# Patient Record
Sex: Female | Born: 1960
Health system: Southern US, Community
[De-identification: ages and names within clinical notes are randomized; demographics above are authoritative.]

## PROBLEM LIST (undated history)

## (undated) DIAGNOSIS — T8859XA Other complications of anesthesia, initial encounter: Secondary | ICD-10-CM

## (undated) DIAGNOSIS — I10 Essential (primary) hypertension: Secondary | ICD-10-CM

## (undated) DIAGNOSIS — M1990 Unspecified osteoarthritis, unspecified site: Secondary | ICD-10-CM

## (undated) DIAGNOSIS — I639 Cerebral infarction, unspecified: Secondary | ICD-10-CM

## (undated) DIAGNOSIS — L509 Urticaria, unspecified: Secondary | ICD-10-CM

## (undated) DIAGNOSIS — I6789 Other cerebrovascular disease: Secondary | ICD-10-CM

## (undated) DIAGNOSIS — Z8669 Personal history of other diseases of the nervous system and sense organs: Secondary | ICD-10-CM

## (undated) DIAGNOSIS — G4733 Obstructive sleep apnea (adult) (pediatric): Secondary | ICD-10-CM

## (undated) DIAGNOSIS — N809 Endometriosis, unspecified: Secondary | ICD-10-CM

## (undated) DIAGNOSIS — F32A Depression, unspecified: Secondary | ICD-10-CM

## (undated) DIAGNOSIS — Z9889 Other specified postprocedural states: Secondary | ICD-10-CM

## (undated) DIAGNOSIS — L309 Dermatitis, unspecified: Secondary | ICD-10-CM

## (undated) DIAGNOSIS — R112 Nausea with vomiting, unspecified: Secondary | ICD-10-CM

## (undated) DIAGNOSIS — F419 Anxiety disorder, unspecified: Secondary | ICD-10-CM

## (undated) HISTORY — PX: TOTAL ABDOMINAL HYSTERECTOMY: SHX209

## (undated) HISTORY — DX: Endometriosis, unspecified: N80.9

## (undated) HISTORY — DX: Other cerebrovascular disease: I67.89

## (undated) HISTORY — DX: Personal history of other diseases of the nervous system and sense organs: Z86.69

## (undated) HISTORY — DX: Obstructive sleep apnea (adult) (pediatric): G47.33

## (undated) HISTORY — DX: Dermatitis, unspecified: L30.9

## (undated) HISTORY — DX: Cerebral infarction, unspecified: I63.9

## (undated) HISTORY — PX: CHOLECYSTECTOMY: SHX55

## (undated) HISTORY — DX: Unspecified osteoarthritis, unspecified site: M19.90

## (undated) HISTORY — PX: APPENDECTOMY: SHX54

## (undated) HISTORY — DX: Urticaria, unspecified: L50.9

## (undated) HISTORY — PX: NECK SURGERY: SHX720

## (undated) HISTORY — PX: SALPINGOOPHORECTOMY: SHX82

## (undated) HISTORY — PX: OTHER SURGICAL HISTORY: SHX169

---

## 1997-08-13 ENCOUNTER — Inpatient Hospital Stay (HOSPITAL_COMMUNITY): Admission: AD | Admit: 1997-08-13 | Discharge: 1997-08-13 | Payer: Self-pay | Admitting: Obstetrics and Gynecology

## 1998-01-01 ENCOUNTER — Encounter: Admission: RE | Admit: 1998-01-01 | Discharge: 1998-04-01 | Payer: Self-pay | Admitting: Gynecology

## 1998-02-07 ENCOUNTER — Ambulatory Visit (HOSPITAL_COMMUNITY): Admission: RE | Admit: 1998-02-07 | Discharge: 1998-02-07 | Payer: Self-pay | Admitting: *Deleted

## 1998-02-15 ENCOUNTER — Inpatient Hospital Stay (HOSPITAL_COMMUNITY): Admission: AD | Admit: 1998-02-15 | Discharge: 1998-02-15 | Payer: Self-pay | Admitting: Gynecology

## 1998-02-18 ENCOUNTER — Inpatient Hospital Stay (HOSPITAL_COMMUNITY): Admission: AD | Admit: 1998-02-18 | Discharge: 1998-02-18 | Payer: Self-pay | Admitting: Obstetrics and Gynecology

## 1999-10-14 ENCOUNTER — Other Ambulatory Visit: Admission: RE | Admit: 1999-10-14 | Discharge: 1999-10-14 | Payer: Self-pay | Admitting: Obstetrics and Gynecology

## 2000-10-18 ENCOUNTER — Other Ambulatory Visit: Admission: RE | Admit: 2000-10-18 | Discharge: 2000-10-18 | Payer: Self-pay | Admitting: Obstetrics and Gynecology

## 2000-12-24 ENCOUNTER — Inpatient Hospital Stay (HOSPITAL_COMMUNITY): Admission: AD | Admit: 2000-12-24 | Discharge: 2000-12-24 | Payer: Self-pay | Admitting: Obstetrics and Gynecology

## 2001-05-31 ENCOUNTER — Observation Stay (HOSPITAL_COMMUNITY): Admission: AD | Admit: 2001-05-31 | Discharge: 2001-05-31 | Payer: Self-pay | Admitting: Obstetrics and Gynecology

## 2001-07-21 ENCOUNTER — Encounter (INDEPENDENT_AMBULATORY_CARE_PROVIDER_SITE_OTHER): Payer: Self-pay | Admitting: Specialist

## 2001-07-21 ENCOUNTER — Inpatient Hospital Stay (HOSPITAL_COMMUNITY): Admission: RE | Admit: 2001-07-21 | Discharge: 2001-07-25 | Payer: Self-pay | Admitting: Obstetrics and Gynecology

## 2001-08-02 ENCOUNTER — Inpatient Hospital Stay (HOSPITAL_COMMUNITY): Admission: AD | Admit: 2001-08-02 | Discharge: 2001-08-02 | Payer: Self-pay | Admitting: Obstetrics and Gynecology

## 2006-05-02 ENCOUNTER — Emergency Department (HOSPITAL_COMMUNITY): Admission: EM | Admit: 2006-05-02 | Discharge: 2006-05-02 | Payer: Self-pay | Admitting: Emergency Medicine

## 2006-05-09 ENCOUNTER — Emergency Department (HOSPITAL_COMMUNITY): Admission: EM | Admit: 2006-05-09 | Discharge: 2006-05-09 | Payer: Self-pay | Admitting: Emergency Medicine

## 2009-12-14 ENCOUNTER — Emergency Department (HOSPITAL_COMMUNITY): Admission: EM | Admit: 2009-12-14 | Discharge: 2009-12-14 | Payer: Self-pay | Admitting: Emergency Medicine

## 2010-08-12 DIAGNOSIS — I6789 Other cerebrovascular disease: Secondary | ICD-10-CM | POA: Insufficient documentation

## 2010-08-12 DIAGNOSIS — N809 Endometriosis, unspecified: Secondary | ICD-10-CM | POA: Insufficient documentation

## 2010-08-13 ENCOUNTER — Institutional Professional Consult (permissible substitution): Payer: Self-pay | Admitting: Pulmonary Disease

## 2010-09-08 ENCOUNTER — Encounter: Payer: Self-pay | Admitting: Pulmonary Disease

## 2010-09-09 ENCOUNTER — Encounter: Payer: Self-pay | Admitting: Pulmonary Disease

## 2010-09-09 ENCOUNTER — Ambulatory Visit (INDEPENDENT_AMBULATORY_CARE_PROVIDER_SITE_OTHER): Payer: Self-pay | Admitting: Pulmonary Disease

## 2010-09-09 VITALS — BP 150/70 | HR 109 | Temp 98.8°F | Ht 65.0 in | Wt 256.8 lb

## 2010-09-09 DIAGNOSIS — G4733 Obstructive sleep apnea (adult) (pediatric): Secondary | ICD-10-CM

## 2010-09-09 HISTORY — DX: Obstructive sleep apnea (adult) (pediatric): G47.33

## 2010-09-09 NOTE — Progress Notes (Signed)
Subjective:    Patient ID: Veronica Leblanc, female    DOB: Aug 01, 1960, 50 y.o.   MRN: [id number]  HPI CC: Veronica Leblanc  50 yo female with OSA.  She was told that she snores, and stops breathing while asleep.  She will also wake up occasionally with a snore.  A friend of hers who has sleep apnea said her symptoms were similar prior to CPAP set up.  [name] also c/o feeling tired all the time.  She goes to bed at 11pm.  She typically falls asleep quickly.  She wakes up several times per night, but is not sure why.  She gets out of bed a 7am to get her daughter ready for school.  She will then go back to sleep for several more hours.  She does not [place] anything to help sleep at night or stay awake during the day.  She does occasionally grind her teeth.  The patient denies sleep walking, sleep talking, or nightmares.  There is no history of restless legs.  The patient denies sleep hallucinations, sleep paralysis, or cataplexy.  She denies alcohol use or smoking.  Her weight has been steady.  There is no history of thyroid disease.  She had a stroke while on birth control for endometriosis at age 33, and then told she had several mini-strokes after that.  Epworth Score is 15 out of 24. PSG from 05/06/10>>AHI 9.6, SpO2 low 77% from [place].   CPAP titration 07/16/10>>CPAP 8 cm from [place].  She has not been set up with CPAP yet.  Past Medical History  Diagnosis Date  . Endometriosis, site unspecified   . Acute, but ill-defined, cerebrovascular disease   . History of migraine headaches   . CVA (cerebral vascular accident)   . Arthritis   . OSA (obstructive sleep apnea)   . Asthma   . CVA (cerebral vascular accident)     Age 69     Family History  Problem Relation Age of Onset  . Diabetes Father   . Diabetes Brother     multiple  . Hypertension Mother   . Hypertension Father   . Hypertension Brother   . Breast cancer Maternal Aunt   . Heart disease Maternal Grandfather     . Heart disease Paternal Grandfather   . Colon cancer Cousin   . Pancreatic cancer Maternal Grandmother      History   Social History  . Marital Status: Divorced    Spouse Name: N/A    Number of Children: N/A  . Years of Education: N/A   Occupational History  . 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 Smoking status: Never Smoker   . Smokeless tobacco: Not on file  . Alcohol Use: Not on file  . Drug Use: Not on file  . Sexually Active: Not on file   Other Topics Concern  . Not on file   Social History Narrative  . No narrative on file     Allergies  Allergen Reactions  . Amitriptyline Hcl Hives and Swelling  . Cefazolin Hives and Swelling  . Ceftriaxone Sodium Hives and Swelling  . Ciprofloxacin Hives and Swelling  . Darvocet (Propoxyphene N-Acetaminophen) Hives and Swelling  . Penicillins Hives and Swelling  . Prochlorperazine Edisylate Hives and Swelling  . Promethazine Hcl     Jaw locks up  . Sulfonamide Derivatives Hives and Swelling  . Sumatriptan Hives and Swelling  Outpatient Prescriptions Prior to Visit  Medication Sig Dispense Refill  . albuterol (PROVENTIL HFA) 108 (90 BASE) MCG/ACT inhaler Inhale 2 puffs into the lungs every 6 (six) hours as needed.        . cetirizine (ZYRTEC) 10 MG tablet Take 10 mg by mouth daily.        . cyclobenzaprine (FLEXERIL) 10 MG tablet Take 10 mg by mouth 3 (three) times daily as needed.        Marland Kitchen ibuprofen (IBU) 800 MG tablet Take 800 mg by mouth every 8 (eight) hours as needed.        . Fluticasone-Salmeterol (ADVAIR DISKUS) 250-50 MCG/DOSE AEPB Inhale 1 puff into the lungs 2 (two) times daily.        . Omega-3 Fatty Acids (FISH OIL) 1000 MG CAPS Take 1 capsule by mouth daily.             Review of Systems  HENT: Positive for ear pain and sneezing.   Respiratory: Positive for cough.   Musculoskeletal: Positive for joint swelling.  Neurological: Positive for headaches.       Objective:    Physical Exam  Constitutional: She is oriented to person, place, and time. She appears well-developed and well-nourished. No distress.  HENT:       Clear nasal discharge, no sinus tenderness, no oral exudate, MP 4 airway, enlarged tongue  Neck: Neck supple. No JVD present. No tracheal deviation present. No thyromegaly present.  Cardiovascular: Normal rate, regular rhythm and normal heart sounds.   No murmur heard. Pulmonary/Chest: Effort normal and breath sounds normal. She has no wheezes. She has no rales. She exhibits no tenderness.  Abdominal: Soft. Bowel sounds are normal. There is no tenderness. There is no rebound and no guarding.  Musculoskeletal: Normal range of motion. She exhibits no edema and no tenderness.  Lymphadenopathy:    She has no cervical adenopathy.  Neurological: She is alert and oriented to person, place, and time. No cranial nerve deficit.  Skin: Skin is warm and dry. No rash noted.  Psychiatric: She has a normal mood and affect. Her behavior is normal.          Assessment & Plan:   OSA (obstructive sleep apnea) I have reviewed his sleep test results with the patient.  Explained how sleep apnea can affect the patient's health.  Driving precautions and importance of weight loss were discussed.  Treatment options for sleep apnea were reviewed.  Will proceed with setting up CPAP 8 cm H2O.  Discussed techniques to adjust to mask fit.    Updated Medication List Outpatient Encounter Prescriptions as of 09/09/2010  Medication Sig Dispense Refill  . albuterol (PROVENTIL HFA) 108 (90 BASE) MCG/ACT inhaler Inhale 2 puffs into the lungs every 6 (six) hours as needed.        . cetirizine (ZYRTEC) 10 MG tablet Take 10 mg by mouth daily.        . cyclobenzaprine (FLEXERIL) 10 MG tablet Take 10 mg by mouth 3 (three) times daily as needed.        Marland Kitchen ibuprofen (IBU) 800 MG tablet Take 800 mg by mouth every 8 (eight) hours as needed.        . Fluticasone-Salmeterol (ADVAIR  DISKUS) 250-50 MCG/DOSE AEPB Inhale 1 puff into the lungs 2 (two) times daily.        . Omega-3 Fatty Acids (FISH OIL) 1000 MG CAPS Take 1 capsule by mouth daily.

## 2010-09-09 NOTE — Patient Instructions (Signed)
Will arrange for CPAP 8 cm H2O.

## 2010-09-09 NOTE — Assessment & Plan Note (Signed)
I have reviewed his sleep test results with the patient.  Explained how sleep apnea can affect the patient's health.  Driving precautions and importance of weight loss were discussed.  Treatment options for sleep apnea were reviewed.  Will proceed with setting up CPAP 8 cm H2O.  Discussed techniques to adjust to mask fit.

## 2010-10-23 ENCOUNTER — Encounter: Payer: Self-pay | Admitting: Pulmonary Disease

## 2010-10-30 NOTE — H&P (Signed)
[facility]  Patient:    Veronica Leblanc, Veronica Leblanc Visit Number: [id number] MRN: [id number]          Service Type: Attending:  Rande Brunt. Eda Paschal, M.D. Dictated by:   Rande Brunt. Eda Paschal, M.D.                           History and Physical  CHIEF COMPLAINT:  Severe dysmenorrhea, menorrhagia, and endometriosis.  HISTORY OF PRESENT ILLNESS:  The patient is a 50 year old gravida 4, para 1, abortus 3, who enters the hospital for total abdominal hysterectomy because of persistent progressive dysmenorrhea and menorrhagia.  She is completely incapacitated during her period.  She becomes severe dehydrated.  She requires regular antiemetics as well as codeine prescription to keep her dysmenorrhea until control and, on occasion, has even had to go to the hospital for intramuscular injections because of the dysmenorrhea, the nausea, and the dehydration.  She has a very long history of endometriosis.  Over a period of 30 years she has undergone five laparoscopies.  She also, in 1989, underwent a left salpingo-oophorectomy and a presacral neurectomy.  At one point she had a lot of trouble conceiving but finally was able to.  She has had a D&C hysteroscopy but continues to have the dysmenorrhea and menorrhagia.  She has failed multiple nonsteroidal anti-inflammatory drugs, has taken antiemetics and codeine, as noted above.  She has a long history of migraines and has not been able to tolerate oral contraceptives as a result of that.  She has tried Megace, she has tried Lupron Depot, and she has tried Depo-Provera.  At this point she is through with her fertility and, because of inability to control the above, she now enters the hospital for total abdominal hysterectomy.  She has one remaining ovary, and we had a very long discussion of pros and cons of removing it.  Obviously, with a history of endometriosis, she could have recurrent pelvic pain if we left it, but at 41 she  is uncomfortable with the thought of being dependent on hormone replacement therapy.  Her neurologist has felt that she probably would tolerate it; however, he said he certainly could not guarantee that.  After thinking about it for a long period, she has elected to keep her ovary unless when we operate on her we find that she has such significant endometriosis that reoperation is a certainty.  She now enters the hospital for above.  PAST MEDICAL HISTORY:  Please see above.  Please note the patient has a history of migraines.  She has also undergone appendectomy and cholecystectomy, as well as the procedures discussed above.  She has also undergone cesarean section.  PRESENT MEDICATIONS:  Lorcet and Inderal.  ALLERGIES:  PENICILLIN, SULFA, ROCEPHIN, CIPRO, ANCEF, IMITREX, LUPRON, PHENERGAN, ELAVIL, and COMPAZINE.  FAMILY HISTORY:  She has brothers who are diabetic.  Her mother, father, and brothers are also hypertensive.  She has a cousin who has had colon cancer. Maternal grandfather and paternal grandfather have had heart disease.  A maternal aunt has had breast cancer.  SOCIAL HISTORY:  She is a nonsmoker, nondrinker.  REVIEW OF SYSTEMS:  HEENT:  History of chronic headache.  CARDIAC:  Negative. RESPIRATORY:  Negative.  GASTROINTESTINAL:  Past history of nausea and vomiting not related to her period, without any significant GI source being found.  GENITOURINARY:  Negative.  NEUROMUSCULAR:  Negative except for headaches.  ENDOCRINE:  Negative.  PHYSICAL EXAMINATION:  GENERAL:  Well-developed, well-nourished female in no acute distress.  VITAL SIGNS:  Blood pressure 130/70, pulse 80 and regular, respirations 16 and unlabored, afebrile.  HEENT:  All within normal limits.  NECK:  Supple.  Trachea is midline.  Thyroid is not enlarged.  LUNGS:  Clear to P&A.  HEART:  No thrills, heaves, or murmurs.  BREASTS:  No masses.  ABDOMEN:  Soft.  Without guarding, rebound, or  masses.  PELVIC:  External and vaginal are within normal limits.  Cervix is clean.  Pap smear shows no atypia.  Uterus is top normal size and shape.  Adnexa within normal limits.  RECTAL:  Negative.  ADMISSION IMPRESSION: 1. Severe dysmenorrhea and menorrhagia, also associated with dehydration. 2. Significant history of endometriosis.  PLAN:  See above. Dictated by:   Rande Brunt. Eda Paschal, M.D. Attending:  Rande Brunt. Eda Paschal, M.D. DD:  07/20/01 TD:  07/20/01 Job: [id number]

## 2010-10-30 NOTE — Discharge Summary (Signed)
[facility]  Patient:    Veronica Leblanc, Veronica Leblanc [facility] Visit Number: [id number] MRN: [id number]          Service Type: INJ Location: [facility] Attending Physician:  Sharon Mt Dictated by:   Rande Brunt. Eda Paschal, M.D. Admit Date:  08/02/2001 Discharge Date: 08/02/2001                             Discharge Summary  HISTORY OF PRESENT ILLNESS:  The patient is a 50 year old female with persistent dysmenorrhea who entered the hospital for definitive surgery.  HOSPITAL COURSE:  On the day of admission she was taken to the operating room. A total abdominal hysterectomy and right ovarian cystectomy was performed for the above.  Postoperatively, the patient had a lot of trouble with pain.  She was treated with different PCA protocols.  She also ran a fever up to 101, which responded to incentive spirometry and ambulation.  In addition, she developed an ileus.  By the fourth postoperative day the ileus had cleared.  DISCHARGE MEDICATIONS: 1. Tylox for pain relief. 2. Zofran for nausea.  FOLLOWUP:  She is going to be seen in the office in three days for staple removal.  CONDITION ON DISCHARGE:  Improved.  ACTIVITY:  Ambulatory.  DIET:  Regular.  Final pathology report revealed a hemorrhagic corpus luteum of the right ovary.  Uterus without endometriosis, which the patient had previously, but with focal serosal fibrous adhesions.  DISCHARGE DIAGNOSIS:  Severe progressive dysmenorrhea.  OPERATION:  Total abdominal hysterectomy, right ovarian cystectomy. Dictated by:   Rande Brunt. Eda Paschal, M.D. Attending Physician:  Sharon Mt DD:  08/11/01 TD:  08/11/01 Job: [id number]

## 2010-10-30 NOTE — Op Note (Signed)
[facility]  Patient:    Veronica Leblanc, Veronica Leblanc [facility] Visit Number: [id number] MRN: [id number]          Service Type: GYN Location: 4W 0453 01 Attending Physician:  Sharon Mt Dictated by:   Rande Brunt. Eda Paschal, M.D. Proc. Date: 07/21/01 Admit Date:  07/21/2001                             Operative Report  PREOPERATIVE DIAGNOSES: 1. Dysmenorrhea. 2. Menorrhagia. 3. Endometriosis suspected.  POSTOPERATIVE DIAGNOSES: 1. Dysmenorrhea. 2. Menorrhagia. 3. Endometriosis suspected. 4. Benign right ovarian cyst.  OPERATION:  Total abdominal hysterectomy, right ovarian cystectomy.  SURGEON:  Daniel L. Eda Paschal, M.D.  FIRST ASSISTANT:  Timothy P. Fontaine, M.D.  FINDINGS:  The patients uterus was boggy and slightly enlarged.  There were adhesions at the vesicouterine fold to perineum where her previous cesarean section was done.  Her right ovary was enlarged by a 3 cm cyst that on frozen section was a corpus luteum hemorrhagic and not endometriosis.  There were no visible signs of endometriosis in the entire exploration of her abdomen and pelvis.  DESCRIPTION OF PROCEDURE:  After adequate general endotracheal anesthesia, the patient was placed in a supine position and prepped and draped in the usual sterile manner.  A Foley catheter was inserted into the patients bladder. The patients previous midline vertical incision was utilized.  A vertical incision was made, carried through the fascia, and then the peritoneum was identified and opened.  Subcutaneous bleeders were clamped and Bovied as encountered.  When the peritoneal cavity was opened, the above findings were noted.  First a right ovarian cystectomy was done with a sharp knife, and the cyst was removed intact without rupturing it.  The ovarian capsule was closed with a running 4-0 Prolene, burying the knot to prevent adhesions.  After this, the hysterectomy was performed.  The right round  ligament was Bovied and cut.  The uteroovarian ligament and fallopian tube on the right were clamped, cut, and doubly suture ligated with #1 chromic catgut.  On the left, she had had a previous left S&O, so all that needed to be done was to dissect the peritoneal fat free from that side.  Then the round ligament could be Bovied and cut.  The bladder flap was advanced with sharp dissection.  This was somewhat difficult because of the previous cesarean, but it was done without injury to the bladder.  The uterine arteries were then clamped, cut, and double suture ligated with #1 chromic catgut.  The parametrium was taken down in successive bites, clamped, cut, and suture ligated with #1 chromic catgut. The cervicovaginal junction was identified and then with sharp dissection, the uterus was sent to pathology for tissue diagnosis.  Frozen section on the ovary came back hemorrhagic corpus luteum, so the ovary was left in place as per my preoperative discussion with the patient.  Copious irrigation was done with Ringers lactate.  The angles of the vagina were sutured with #1 chromic catgut, incorporating cardinal ligaments and uterosacral ligaments for good vault support, and then the cuff was closed with figure-of-eights of 0 Vicryl. Two sponge, needle, and instrument counts were correct.  Peritoneum and fascia were closed in two layers with a looped 0 PDS, starting at each end and having it tied in the midline.  The skin was closed with staples.  Estimated blood loss for the entire procedure was 350 cc with none replaced.  The patient tolerated the procedure well and left the operating room in satisfactory condition, draining clear urine from her Foley catheter. Dictated by:   Rande Brunt. Eda Paschal, M.D. Attending Physician:  Sharon Mt DD:  07/21/01 TD:  07/21/01 Job: [id number]

## 2010-11-03 ENCOUNTER — Encounter: Payer: Self-pay | Admitting: Pulmonary Disease

## 2010-11-10 ENCOUNTER — Ambulatory Visit: Payer: Self-pay | Admitting: Pulmonary Disease

## 2010-11-20 ENCOUNTER — Encounter: Payer: Self-pay | Admitting: Pulmonary Disease

## 2010-11-24 ENCOUNTER — Ambulatory Visit: Payer: Self-pay | Admitting: Pulmonary Disease

## 2018-03-17 DIAGNOSIS — S161XXA Strain of muscle, fascia and tendon at neck level, initial encounter: Secondary | ICD-10-CM | POA: Diagnosis not present

## 2018-03-17 DIAGNOSIS — R000 Tachycardia, unspecified: Secondary | ICD-10-CM | POA: Diagnosis not present

## 2018-03-17 DIAGNOSIS — M542 Cervicalgia: Secondary | ICD-10-CM | POA: Diagnosis not present

## 2018-03-17 DIAGNOSIS — I10 Essential (primary) hypertension: Secondary | ICD-10-CM | POA: Diagnosis not present

## 2018-03-17 DIAGNOSIS — W19XXXA Unspecified fall, initial encounter: Secondary | ICD-10-CM | POA: Diagnosis not present

## 2018-03-17 DIAGNOSIS — S80919A Unspecified superficial injury of unspecified knee, initial encounter: Secondary | ICD-10-CM | POA: Diagnosis not present

## 2018-04-07 DIAGNOSIS — T7840XA Allergy, unspecified, initial encounter: Secondary | ICD-10-CM | POA: Diagnosis not present

## 2018-04-07 DIAGNOSIS — Z9114 Patient's other noncompliance with medication regimen: Secondary | ICD-10-CM | POA: Diagnosis not present

## 2018-04-07 DIAGNOSIS — R069 Unspecified abnormalities of breathing: Secondary | ICD-10-CM | POA: Diagnosis not present

## 2018-04-07 DIAGNOSIS — L299 Pruritus, unspecified: Secondary | ICD-10-CM | POA: Diagnosis not present

## 2018-04-07 DIAGNOSIS — L509 Urticaria, unspecified: Secondary | ICD-10-CM | POA: Diagnosis not present

## 2018-04-07 DIAGNOSIS — T782XXA Anaphylactic shock, unspecified, initial encounter: Secondary | ICD-10-CM | POA: Diagnosis not present

## 2018-04-07 DIAGNOSIS — I10 Essential (primary) hypertension: Secondary | ICD-10-CM | POA: Diagnosis not present

## 2018-04-07 DIAGNOSIS — R000 Tachycardia, unspecified: Secondary | ICD-10-CM | POA: Diagnosis not present

## 2018-04-27 DIAGNOSIS — I674 Hypertensive encephalopathy: Secondary | ICD-10-CM | POA: Diagnosis not present

## 2018-04-27 DIAGNOSIS — I10 Essential (primary) hypertension: Secondary | ICD-10-CM | POA: Diagnosis not present

## 2018-04-27 DIAGNOSIS — Z7189 Other specified counseling: Secondary | ICD-10-CM | POA: Diagnosis not present

## 2018-04-27 DIAGNOSIS — R000 Tachycardia, unspecified: Secondary | ICD-10-CM | POA: Diagnosis not present

## 2018-05-04 DIAGNOSIS — M25562 Pain in left knee: Secondary | ICD-10-CM | POA: Diagnosis not present

## 2018-05-04 DIAGNOSIS — I674 Hypertensive encephalopathy: Secondary | ICD-10-CM | POA: Diagnosis not present

## 2018-05-04 DIAGNOSIS — E785 Hyperlipidemia, unspecified: Secondary | ICD-10-CM | POA: Diagnosis not present

## 2018-05-04 DIAGNOSIS — M1712 Unilateral primary osteoarthritis, left knee: Secondary | ICD-10-CM | POA: Diagnosis not present

## 2018-05-04 DIAGNOSIS — G4733 Obstructive sleep apnea (adult) (pediatric): Secondary | ICD-10-CM | POA: Diagnosis not present

## 2018-05-15 DIAGNOSIS — E785 Hyperlipidemia, unspecified: Secondary | ICD-10-CM | POA: Diagnosis not present

## 2018-05-15 DIAGNOSIS — M1710 Unilateral primary osteoarthritis, unspecified knee: Secondary | ICD-10-CM | POA: Diagnosis not present

## 2018-05-15 DIAGNOSIS — R7301 Impaired fasting glucose: Secondary | ICD-10-CM | POA: Diagnosis not present

## 2018-05-15 DIAGNOSIS — I10 Essential (primary) hypertension: Secondary | ICD-10-CM | POA: Diagnosis not present

## 2018-05-15 DIAGNOSIS — R000 Tachycardia, unspecified: Secondary | ICD-10-CM | POA: Diagnosis not present

## 2018-05-17 DIAGNOSIS — Z1231 Encounter for screening mammogram for malignant neoplasm of breast: Secondary | ICD-10-CM | POA: Diagnosis not present

## 2018-05-31 DIAGNOSIS — R000 Tachycardia, unspecified: Secondary | ICD-10-CM | POA: Diagnosis not present

## 2018-05-31 DIAGNOSIS — I10 Essential (primary) hypertension: Secondary | ICD-10-CM | POA: Diagnosis not present

## 2018-05-31 DIAGNOSIS — R7303 Prediabetes: Secondary | ICD-10-CM | POA: Diagnosis not present

## 2018-06-03 DIAGNOSIS — R509 Fever, unspecified: Secondary | ICD-10-CM | POA: Diagnosis not present

## 2018-06-03 DIAGNOSIS — R0981 Nasal congestion: Secondary | ICD-10-CM | POA: Diagnosis not present

## 2018-06-05 DIAGNOSIS — R928 Other abnormal and inconclusive findings on diagnostic imaging of breast: Secondary | ICD-10-CM | POA: Diagnosis not present

## 2018-06-05 DIAGNOSIS — R921 Mammographic calcification found on diagnostic imaging of breast: Secondary | ICD-10-CM | POA: Diagnosis not present

## 2018-06-05 DIAGNOSIS — G4733 Obstructive sleep apnea (adult) (pediatric): Secondary | ICD-10-CM | POA: Diagnosis not present

## 2018-06-12 DIAGNOSIS — R928 Other abnormal and inconclusive findings on diagnostic imaging of breast: Secondary | ICD-10-CM | POA: Diagnosis not present

## 2018-06-12 DIAGNOSIS — R000 Tachycardia, unspecified: Secondary | ICD-10-CM | POA: Diagnosis not present

## 2018-06-12 DIAGNOSIS — I10 Essential (primary) hypertension: Secondary | ICD-10-CM | POA: Diagnosis not present

## 2018-06-12 DIAGNOSIS — G4733 Obstructive sleep apnea (adult) (pediatric): Secondary | ICD-10-CM | POA: Diagnosis not present

## 2018-06-12 DIAGNOSIS — R921 Mammographic calcification found on diagnostic imaging of breast: Secondary | ICD-10-CM | POA: Diagnosis not present

## 2018-06-15 DIAGNOSIS — N6011 Diffuse cystic mastopathy of right breast: Secondary | ICD-10-CM | POA: Diagnosis not present

## 2018-06-15 DIAGNOSIS — R928 Other abnormal and inconclusive findings on diagnostic imaging of breast: Secondary | ICD-10-CM | POA: Diagnosis not present

## 2018-06-15 DIAGNOSIS — N642 Atrophy of breast: Secondary | ICD-10-CM | POA: Diagnosis not present

## 2018-06-29 DIAGNOSIS — I10 Essential (primary) hypertension: Secondary | ICD-10-CM | POA: Diagnosis not present

## 2018-06-29 DIAGNOSIS — Z7189 Other specified counseling: Secondary | ICD-10-CM | POA: Diagnosis not present

## 2018-06-29 DIAGNOSIS — R062 Wheezing: Secondary | ICD-10-CM | POA: Diagnosis not present

## 2018-06-29 DIAGNOSIS — G4733 Obstructive sleep apnea (adult) (pediatric): Secondary | ICD-10-CM | POA: Diagnosis not present

## 2018-06-29 DIAGNOSIS — R000 Tachycardia, unspecified: Secondary | ICD-10-CM | POA: Diagnosis not present

## 2018-07-04 DIAGNOSIS — R000 Tachycardia, unspecified: Secondary | ICD-10-CM | POA: Diagnosis not present

## 2018-07-04 DIAGNOSIS — E785 Hyperlipidemia, unspecified: Secondary | ICD-10-CM | POA: Diagnosis not present

## 2018-07-04 DIAGNOSIS — I10 Essential (primary) hypertension: Secondary | ICD-10-CM | POA: Diagnosis not present

## 2018-07-04 DIAGNOSIS — J45909 Unspecified asthma, uncomplicated: Secondary | ICD-10-CM | POA: Diagnosis not present

## 2018-07-11 DIAGNOSIS — Z139 Encounter for screening, unspecified: Secondary | ICD-10-CM | POA: Diagnosis not present

## 2018-07-11 DIAGNOSIS — I10 Essential (primary) hypertension: Secondary | ICD-10-CM | POA: Diagnosis not present

## 2018-07-11 DIAGNOSIS — R000 Tachycardia, unspecified: Secondary | ICD-10-CM | POA: Diagnosis not present

## 2018-07-11 DIAGNOSIS — Z0000 Encounter for general adult medical examination without abnormal findings: Secondary | ICD-10-CM | POA: Diagnosis not present

## 2018-07-18 DIAGNOSIS — R000 Tachycardia, unspecified: Secondary | ICD-10-CM | POA: Diagnosis not present

## 2018-07-18 DIAGNOSIS — I10 Essential (primary) hypertension: Secondary | ICD-10-CM | POA: Diagnosis not present

## 2018-07-27 DIAGNOSIS — I674 Hypertensive encephalopathy: Secondary | ICD-10-CM | POA: Diagnosis not present

## 2018-07-27 DIAGNOSIS — E785 Hyperlipidemia, unspecified: Secondary | ICD-10-CM | POA: Diagnosis not present

## 2018-07-27 DIAGNOSIS — Z0000 Encounter for general adult medical examination without abnormal findings: Secondary | ICD-10-CM | POA: Diagnosis not present

## 2018-07-27 DIAGNOSIS — Z1211 Encounter for screening for malignant neoplasm of colon: Secondary | ICD-10-CM | POA: Diagnosis not present

## 2018-07-27 DIAGNOSIS — R7303 Prediabetes: Secondary | ICD-10-CM | POA: Diagnosis not present

## 2018-07-27 DIAGNOSIS — Z1329 Encounter for screening for other suspected endocrine disorder: Secondary | ICD-10-CM | POA: Diagnosis not present

## 2018-08-11 DIAGNOSIS — J45909 Unspecified asthma, uncomplicated: Secondary | ICD-10-CM | POA: Diagnosis not present

## 2018-08-29 DIAGNOSIS — R609 Edema, unspecified: Secondary | ICD-10-CM | POA: Diagnosis not present

## 2018-08-29 DIAGNOSIS — M25562 Pain in left knee: Secondary | ICD-10-CM | POA: Diagnosis not present

## 2018-08-29 DIAGNOSIS — M79662 Pain in left lower leg: Secondary | ICD-10-CM | POA: Diagnosis not present

## 2018-08-29 DIAGNOSIS — M7122 Synovial cyst of popliteal space [Baker], left knee: Secondary | ICD-10-CM | POA: Diagnosis not present

## 2018-08-30 DIAGNOSIS — G4733 Obstructive sleep apnea (adult) (pediatric): Secondary | ICD-10-CM | POA: Diagnosis not present

## 2018-09-01 DIAGNOSIS — G4733 Obstructive sleep apnea (adult) (pediatric): Secondary | ICD-10-CM | POA: Diagnosis not present

## 2018-09-05 DIAGNOSIS — Z7189 Other specified counseling: Secondary | ICD-10-CM | POA: Diagnosis not present

## 2018-09-05 DIAGNOSIS — J449 Chronic obstructive pulmonary disease, unspecified: Secondary | ICD-10-CM | POA: Diagnosis not present

## 2018-09-05 DIAGNOSIS — H269 Unspecified cataract: Secondary | ICD-10-CM | POA: Diagnosis not present

## 2018-09-05 DIAGNOSIS — M25562 Pain in left knee: Secondary | ICD-10-CM | POA: Diagnosis not present

## 2018-09-07 DIAGNOSIS — S83012A Lateral subluxation of left patella, initial encounter: Secondary | ICD-10-CM | POA: Diagnosis not present

## 2018-09-07 DIAGNOSIS — G8929 Other chronic pain: Secondary | ICD-10-CM

## 2018-09-07 DIAGNOSIS — M1712 Unilateral primary osteoarthritis, left knee: Secondary | ICD-10-CM | POA: Diagnosis not present

## 2018-09-07 DIAGNOSIS — M25562 Pain in left knee: Secondary | ICD-10-CM | POA: Diagnosis not present

## 2018-09-07 HISTORY — DX: Other chronic pain: G89.29

## 2018-09-13 DIAGNOSIS — J449 Chronic obstructive pulmonary disease, unspecified: Secondary | ICD-10-CM | POA: Diagnosis not present

## 2018-09-17 DIAGNOSIS — G4733 Obstructive sleep apnea (adult) (pediatric): Secondary | ICD-10-CM | POA: Diagnosis not present

## 2018-10-05 DIAGNOSIS — I872 Venous insufficiency (chronic) (peripheral): Secondary | ICD-10-CM | POA: Diagnosis not present

## 2018-10-05 DIAGNOSIS — Z7189 Other specified counseling: Secondary | ICD-10-CM | POA: Diagnosis not present

## 2018-10-13 DIAGNOSIS — J449 Chronic obstructive pulmonary disease, unspecified: Secondary | ICD-10-CM | POA: Diagnosis not present

## 2018-10-31 DIAGNOSIS — Z7189 Other specified counseling: Secondary | ICD-10-CM | POA: Diagnosis not present

## 2018-10-31 DIAGNOSIS — J449 Chronic obstructive pulmonary disease, unspecified: Secondary | ICD-10-CM | POA: Diagnosis not present

## 2018-11-13 DIAGNOSIS — J449 Chronic obstructive pulmonary disease, unspecified: Secondary | ICD-10-CM | POA: Diagnosis not present

## 2018-11-21 DIAGNOSIS — E785 Hyperlipidemia, unspecified: Secondary | ICD-10-CM | POA: Diagnosis not present

## 2018-11-21 DIAGNOSIS — R7303 Prediabetes: Secondary | ICD-10-CM | POA: Diagnosis not present

## 2018-11-27 ENCOUNTER — Institutional Professional Consult (permissible substitution): Payer: Self-pay | Admitting: Internal Medicine

## 2018-11-28 DIAGNOSIS — E785 Hyperlipidemia, unspecified: Secondary | ICD-10-CM | POA: Diagnosis not present

## 2018-11-28 DIAGNOSIS — I10 Essential (primary) hypertension: Secondary | ICD-10-CM | POA: Diagnosis not present

## 2018-11-28 DIAGNOSIS — R7303 Prediabetes: Secondary | ICD-10-CM | POA: Diagnosis not present

## 2018-11-28 DIAGNOSIS — Z23 Encounter for immunization: Secondary | ICD-10-CM | POA: Diagnosis not present

## 2018-11-28 DIAGNOSIS — R000 Tachycardia, unspecified: Secondary | ICD-10-CM | POA: Diagnosis not present

## 2018-12-07 DIAGNOSIS — M549 Dorsalgia, unspecified: Secondary | ICD-10-CM | POA: Diagnosis not present

## 2018-12-13 ENCOUNTER — Institutional Professional Consult (permissible substitution): Payer: Self-pay | Admitting: Internal Medicine

## 2018-12-13 DIAGNOSIS — J449 Chronic obstructive pulmonary disease, unspecified: Secondary | ICD-10-CM | POA: Diagnosis not present

## 2018-12-21 DIAGNOSIS — G8929 Other chronic pain: Secondary | ICD-10-CM | POA: Diagnosis not present

## 2018-12-21 DIAGNOSIS — M25562 Pain in left knee: Secondary | ICD-10-CM | POA: Diagnosis not present

## 2018-12-22 DIAGNOSIS — L508 Other urticaria: Secondary | ICD-10-CM | POA: Diagnosis not present

## 2018-12-26 DIAGNOSIS — Z87828 Personal history of other (healed) physical injury and trauma: Secondary | ICD-10-CM | POA: Diagnosis not present

## 2018-12-26 DIAGNOSIS — M47812 Spondylosis without myelopathy or radiculopathy, cervical region: Secondary | ICD-10-CM | POA: Diagnosis not present

## 2018-12-26 DIAGNOSIS — M542 Cervicalgia: Secondary | ICD-10-CM | POA: Diagnosis not present

## 2018-12-26 DIAGNOSIS — S0990XA Unspecified injury of head, initial encounter: Secondary | ICD-10-CM | POA: Diagnosis not present

## 2018-12-28 DIAGNOSIS — R52 Pain, unspecified: Secondary | ICD-10-CM | POA: Diagnosis not present

## 2018-12-28 DIAGNOSIS — R110 Nausea: Secondary | ICD-10-CM | POA: Diagnosis not present

## 2018-12-28 DIAGNOSIS — R197 Diarrhea, unspecified: Secondary | ICD-10-CM | POA: Diagnosis not present

## 2018-12-28 DIAGNOSIS — R112 Nausea with vomiting, unspecified: Secondary | ICD-10-CM | POA: Diagnosis not present

## 2018-12-28 DIAGNOSIS — R55 Syncope and collapse: Secondary | ICD-10-CM | POA: Diagnosis not present

## 2018-12-28 DIAGNOSIS — M542 Cervicalgia: Secondary | ICD-10-CM | POA: Diagnosis not present

## 2018-12-28 DIAGNOSIS — R51 Headache: Secondary | ICD-10-CM | POA: Diagnosis not present

## 2019-01-02 DIAGNOSIS — M542 Cervicalgia: Secondary | ICD-10-CM | POA: Diagnosis not present

## 2019-01-02 DIAGNOSIS — G44209 Tension-type headache, unspecified, not intractable: Secondary | ICD-10-CM | POA: Diagnosis not present

## 2019-01-13 DIAGNOSIS — J449 Chronic obstructive pulmonary disease, unspecified: Secondary | ICD-10-CM | POA: Diagnosis not present

## 2019-02-02 DIAGNOSIS — M7062 Trochanteric bursitis, left hip: Secondary | ICD-10-CM | POA: Diagnosis not present

## 2019-02-02 DIAGNOSIS — B372 Candidiasis of skin and nail: Secondary | ICD-10-CM | POA: Diagnosis not present

## 2019-02-08 DIAGNOSIS — H5213 Myopia, bilateral: Secondary | ICD-10-CM | POA: Diagnosis not present

## 2019-02-08 DIAGNOSIS — R000 Tachycardia, unspecified: Secondary | ICD-10-CM | POA: Diagnosis not present

## 2019-02-08 DIAGNOSIS — I10 Essential (primary) hypertension: Secondary | ICD-10-CM | POA: Diagnosis not present

## 2019-02-08 DIAGNOSIS — H25813 Combined forms of age-related cataract, bilateral: Secondary | ICD-10-CM | POA: Diagnosis not present

## 2019-02-13 DIAGNOSIS — J449 Chronic obstructive pulmonary disease, unspecified: Secondary | ICD-10-CM | POA: Diagnosis not present

## 2019-02-15 DIAGNOSIS — R000 Tachycardia, unspecified: Secondary | ICD-10-CM | POA: Diagnosis not present

## 2019-02-15 DIAGNOSIS — I10 Essential (primary) hypertension: Secondary | ICD-10-CM | POA: Diagnosis not present

## 2019-02-15 DIAGNOSIS — E039 Hypothyroidism, unspecified: Secondary | ICD-10-CM | POA: Diagnosis not present

## 2019-02-15 DIAGNOSIS — R7989 Other specified abnormal findings of blood chemistry: Secondary | ICD-10-CM | POA: Diagnosis not present

## 2019-02-17 DIAGNOSIS — R000 Tachycardia, unspecified: Secondary | ICD-10-CM | POA: Diagnosis not present

## 2019-03-06 DIAGNOSIS — M25562 Pain in left knee: Secondary | ICD-10-CM | POA: Diagnosis not present

## 2019-03-06 DIAGNOSIS — G8929 Other chronic pain: Secondary | ICD-10-CM | POA: Diagnosis not present

## 2019-03-08 DIAGNOSIS — R000 Tachycardia, unspecified: Secondary | ICD-10-CM | POA: Diagnosis not present

## 2019-03-08 DIAGNOSIS — I10 Essential (primary) hypertension: Secondary | ICD-10-CM | POA: Diagnosis not present

## 2019-03-12 ENCOUNTER — Other Ambulatory Visit: Payer: Self-pay | Admitting: Orthopedic Surgery

## 2019-03-12 DIAGNOSIS — M25562 Pain in left knee: Secondary | ICD-10-CM

## 2019-03-15 DIAGNOSIS — J449 Chronic obstructive pulmonary disease, unspecified: Secondary | ICD-10-CM | POA: Diagnosis not present

## 2019-03-21 ENCOUNTER — Other Ambulatory Visit: Payer: Self-pay

## 2019-03-21 ENCOUNTER — Encounter: Payer: Self-pay | Admitting: Internal Medicine

## 2019-03-21 ENCOUNTER — Ambulatory Visit (INDEPENDENT_AMBULATORY_CARE_PROVIDER_SITE_OTHER): Payer: Medicare Other | Admitting: Internal Medicine

## 2019-03-21 VITALS — BP 130/60 | HR 81 | Temp 97.7°F | Ht 64.25 in | Wt 275.8 lb

## 2019-03-21 DIAGNOSIS — J4520 Mild intermittent asthma, uncomplicated: Secondary | ICD-10-CM | POA: Insufficient documentation

## 2019-03-21 DIAGNOSIS — G4733 Obstructive sleep apnea (adult) (pediatric): Secondary | ICD-10-CM

## 2019-03-21 HISTORY — DX: Mild intermittent asthma, uncomplicated: J45.20

## 2019-03-21 MED ORDER — TEMAZEPAM 15 MG PO CAPS: ORAL_CAPSULE | ORAL | 0 refills | Status: DC

## 2019-03-21 NOTE — Progress Notes (Signed)
03/21/2019- 42 yoF never smoker for sleep evaluation referred by Janeice Robinson (PCP - [facility]) for OSA, reports having home sleep test spring 2020 - result being requested. Medical problem list includes CVA, OSA, Endometriosis, Asthma, Allergic Rhinitis, HBP,  [facility] 05/05/10- AHI 9.6/ hr, RDI 13.2/ hr, desaturation to 77%. Subsequent CPAP titration to 8 Has CPAP at home, not using. Body weight today 275 lbs Epworth score 17 Declines flu vax Her concerns are with difficulty initiating and maintaining sleep. Using melatonin with no prescription sleep med in past. Restless sleep with frequent brief waking, somatic discomforts, bathroom. Never feels rested. Daughter watching her tells her snoring and frequent apneas.  Naps help some. Occasional caffeine. 20 lb weight increase 2 years. Couldn't get comfortable with CPAP mask in 2012, pulled off in sleep.  She reports home sleep test this Spring didn't show much apnea but she did desaturate.  Bedtime 1-2AM, up 10-11AM.  Has had several C-spine surgeries, no Throat surgery. Has home O2 2L / [employer] for sleep but didn't change how she feels so not using.   Prior to Admission medications   Medication Sig Start Date End Date Taking? Authorizing Provider  albuterol (PROVENTIL HFA) 108 (90 BASE) MCG/ACT inhaler Inhale 2 puffs into the lungs every 6 (six) hours as needed.     Yes [provider]  budesonide-formoterol (SYMBICORT) 160-4.5 MCG/ACT inhaler Inhale 2 puffs into the lungs 2 (two) times daily.   Yes [provider]  cetirizine (ZYRTEC) 10 MG tablet Take 10 mg by mouth daily.     Yes [provider]  citalopram (CELEXA) 40 MG tablet Take 40 mg by mouth daily. Takes 1 tablet at bedtime.   Yes [provider]  furosemide (LASIX) 20 MG tablet Take 20 mg by mouth daily. Takes 1/2 tablet dailey as needed for edema.   Yes [provider]  lisinopril (ZESTRIL) 40 MG tablet  Take 40 mg by mouth daily.   Yes [provider]  Melatonin 1 MG TABS Take 9 mg by mouth at bedtime.   Yes [provider]  metoprolol tartrate (LOPRESSOR) 25 MG tablet Take 25 mg by mouth daily.   Yes [provider]  montelukast (SINGULAIR) 10 MG tablet Take 10 mg by mouth at bedtime.   Yes [provider]  rosuvastatin (CRESTOR) 10 MG tablet Take 10 mg by mouth daily.   Yes [provider]  cyclobenzaprine (FLEXERIL) 10 MG tablet Take 10 mg by mouth 3 (three) times daily as needed.      [provider]  ibuprofen (IBU) 800 MG tablet Take 800 mg by mouth every 8 (eight) hours as needed.      [provider]  Omega-3 Fatty Acids (FISH OIL) 1000 MG CAPS Take 1 capsule by mouth daily.      [provider]  temazepam (RESTORIL) 15 MG capsule 1 or 2 for sleep study as needed 03/21/19   Deneise Lever, MD   Past Medical History:  Diagnosis Date  . Acute, but ill-defined, cerebrovascular disease   . Arthritis   . Asthma   . CVA (cerebral vascular accident) ([place])   . CVA (cerebral vascular accident) ([place])    Age 58  . Endometriosis, site unspecified   . History of migraine headaches   . OSA (obstructive sleep apnea)    Past Surgical History:  Procedure Laterality Date  . APPENDECTOMY    . CESAREAN SECTION    . CHOLECYSTECTOMY    .  ganglionectomy     c2/c3 right and left side  . NECK SURGERY     5 times  . presacral neurectomy    . SALPINGOOPHORECTOMY     left  . TOTAL ABDOMINAL HYSTERECTOMY     Family History  Problem Relation Age of Onset  . Diabetes Father   . Hypertension Father   . Diabetes Brother        multiple  . Hypertension Mother   . Hypertension Brother   . Breast cancer Maternal Aunt   . Heart disease Maternal Grandfather   . Heart disease Paternal Grandfather   . Colon cancer Cousin   . Pancreatic cancer Maternal Grandmother    Social History   Socioeconomic History  . Marital status:  Divorced    Spouse name: Not on file  . Number of children: Not on file  . Years of education: Not on file  . Highest education level: Not on file  Occupational History  . 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 Financial resource strain: Not on file  . Food insecurity    Worry: Not on file    Inability: Not on file  . Transportation needs    Medical: Not on file    Non-medical: Not on file  Tobacco Use  . Smoking status: Never Smoker  . Smokeless tobacco: Never Used  Substance and Sexual Activity  . Alcohol use: Not on file  . Drug use: Not on file  . Sexual activity: Not on file  Lifestyle  . Physical activity    Days per week: Not on file    Minutes per session: Not on file  . Stress: Not on file  Relationships  . Social Musicianconnections    Talks on phone: Not on file    Gets together: Not on file    Attends religious service: Not on file    Active member of club or organization: Not on file    Attends meetings of clubs or organizations: Not on file    Relationship status: Not on file  . Intimate partner violence    Fear of current or ex partner: Not on file    Emotionally abused: Not on file    Physically abused: Not on file    Forced sexual activity: Not on file  Other Topics Concern  . Not on file  Social History Narrative  . Not on file    ROS-see HPI   + = positive Constitutional:    weight loss, night sweats, fevers, chills, +fatigue, lassitude. HEENT:    +headaches, difficulty swallowing, tooth/dental problems, sore throat,       +sneezing, itching, ear ache, +nasal congestion, post nasal drip, snoring CV:    chest pain, orthopnea, PND, +swelling in lower extremities, anasarca,                                  dizziness, palpitations Resp:   shortness of breath with exertion or at rest.                productive cough,   +non-productive cough, coughing up of blood.              change in color of mucus.  wheezing.   Skin:    rash or  lesions. GI:  No-   heartburn, indigestion, abdominal pain, nausea, vomiting, diarrhea,  change in bowel habits, loss of appetite GU: dysuria, change in color of urine, no urgency or frequency.   flank pain. MS:   joint pain, stiffness, decreased range of motion, back pain. Neuro-     nothing unusual Psych:  change in mood or affect.  depression or anxiety.   memory loss.  OBJ- Physical Exam General- Alert, Oriented, Affect-appropriate, Distress- none acute, + obese Skin- rash-none, lesions- none, excoriation- none Lymphadenopathy- none Head- atraumatic            Eyes- Gross vision intact, PERRLA, conjunctivae and secretions clear            Ears- Hearing, canals-normal            Nose- Clear, no-Septal dev, mucus, polyps, erosion, perforation             Throat- Mallampati III , mucosa clear , drainage- none, tonsils- atrophic,  + many missing teeth Neck- flexible , trachea midline, no stridor , thyroid nl, carotid no bruit Chest - symmetrical excursion , unlabored           Heart/CV- RRR , no murmur , no gallop  , no rub, nl s1 s2                           - JVD- none , edema- none, stasis changes- none, varices- none           Lung- clear to P&A, wheeze- none, cough- none , dullness-none, rub- none           Chest wall-  Abd-  Br/ Gen/ Rectal- Not done, not indicated Extrem- cyanosis- none, clubbing, none, atrophy- none, strength- nl Neuro- grossly intact to observation

## 2019-03-21 NOTE — Assessment & Plan Note (Signed)
By her description recent HST didn't show apnea, but did desaturate. Got home O2 but not using. Daughter tells her of witnessed apneas.  We are going to get an in-center sleep study to resolve this. She may then need additional treatment for insomnia. I've given limited supply of trazodone to help her sleep for the sleep study after discussion.

## 2019-03-21 NOTE — Patient Instructions (Addendum)
Order- We need to get result of recent home sleep test  Order- schedule Split Protocol sleep study   Dx OSA  Script sent for temazepam to take the night of your sleep study, as you get ready for bed if needed to help you sleep.  If you have more problem with asthma we can try to help with that as well.  Please call if you need [place].

## 2019-03-21 NOTE — Assessment & Plan Note (Addendum)
She reports this is occasional, but not currently active. If our in-put is needed we will try to help.

## 2019-03-30 ENCOUNTER — Other Ambulatory Visit: Payer: Self-pay

## 2019-03-30 ENCOUNTER — Ambulatory Visit
Admission: RE | Admit: 2019-03-30 | Discharge: 2019-03-30 | Disposition: A | Discharge status: Home / Self Care | Payer: Medicare Other | Source: Ambulatory Visit | Attending: Orthopedic Surgery | Admitting: Orthopedic Surgery

## 2019-03-30 ENCOUNTER — Other Ambulatory Visit: Payer: Medicare Other

## 2019-03-30 DIAGNOSIS — M25562 Pain in left knee: Secondary | ICD-10-CM

## 2019-03-30 DIAGNOSIS — M1712 Unilateral primary osteoarthritis, left knee: Secondary | ICD-10-CM | POA: Diagnosis not present

## 2019-03-30 DIAGNOSIS — M23322 Other meniscus derangements, posterior horn of medial meniscus, left knee: Secondary | ICD-10-CM | POA: Diagnosis not present

## 2019-03-30 DIAGNOSIS — M65862 Other synovitis and tenosynovitis, left lower leg: Secondary | ICD-10-CM | POA: Diagnosis not present

## 2019-03-30 DIAGNOSIS — M25462 Effusion, left knee: Secondary | ICD-10-CM | POA: Diagnosis not present

## 2019-03-31 ENCOUNTER — Other Ambulatory Visit (HOSPITAL_COMMUNITY)
Admission: RE | Admit: 2019-03-31 | Discharge: 2019-03-31 | Disposition: A | Discharge status: Home / Self Care | Payer: Medicare Other | Source: Ambulatory Visit | Attending: Internal Medicine | Admitting: Internal Medicine

## 2019-03-31 DIAGNOSIS — Z01812 Encounter for preprocedural laboratory examination: Secondary | ICD-10-CM | POA: Diagnosis not present

## 2019-03-31 DIAGNOSIS — Z20828 Contact with and (suspected) exposure to other viral communicable diseases: Secondary | ICD-10-CM | POA: Diagnosis not present

## 2019-04-01 LAB — NOVEL CORONAVIRUS, NAA (HOSP ORDER, SEND-OUT TO REF LAB; TAT 18-24 HRS): SARS-CoV-2, NAA: NOT DETECTED

## 2019-04-03 ENCOUNTER — Other Ambulatory Visit: Payer: Self-pay

## 2019-04-03 ENCOUNTER — Ambulatory Visit (HOSPITAL_BASED_OUTPATIENT_CLINIC_OR_DEPARTMENT_OTHER): Payer: Medicare Other | Attending: Internal Medicine | Admitting: Internal Medicine

## 2019-04-03 DIAGNOSIS — G4733 Obstructive sleep apnea (adult) (pediatric): Secondary | ICD-10-CM | POA: Insufficient documentation

## 2019-04-08 DIAGNOSIS — G4733 Obstructive sleep apnea (adult) (pediatric): Secondary | ICD-10-CM | POA: Diagnosis not present

## 2019-04-08 NOTE — Procedures (Signed)
   Patient Name: Veronica Leblanc, Kedzierski Date: 04/03/2019 Gender: Female D.O.B: [birth year] Age (years): 89 Referring Provider: Baird Lyons MD, ABSM Height (inches): 64 Interpreting Physician: Baird Lyons MD, ABSM Weight (lbs): 275 RPSGT: Laren Everts BMI: 17 MRN: [id number] Neck Size: 18.00  CLINICAL INFORMATION Sleep Study Type: NPSG Indication for sleep study: Excessive Daytime Sleepiness, Fatigue, Hypertension, Morning Headaches, Obesity, OSA, Re-Evaluation, Snoring, Witnessed Apneas Epworth Sleepiness Score: 15  SLEEP STUDY TECHNIQUE As per the AASM Manual for the Scoring of Sleep and Associated Events v2.3 (April 2016) with a hypopnea requiring 4% desaturations.  The channels recorded and monitored were frontal, central and occipital EEG, electrooculogram (EOG), submentalis EMG (chin), nasal and oral airflow, thoracic and abdominal wall motion, anterior tibialis EMG, snore microphone, electrocardiogram, and pulse oximetry.  MEDICATIONS Medications self-administered by patient taken the night of the study : CELEXA, CETIRIZINE, Rosuvastatin, SINGULAIR, SYMBICORT, TEMAZEPAM  SLEEP ARCHITECTURE The study was initiated at 10:05:50 PM and ended at 4:57:18 AM.  Sleep onset time was 11.9 minutes and the sleep efficiency was 86.5%%. The total sleep time was 356 minutes.  Stage REM latency was 173.0 minutes.  The patient spent 6.6%% of the night in stage N1 sleep, 74.7%% in stage N2 sleep, 0.0%% in stage N3 and 18.7% in REM.  Alpha intrusion was absent.  Supine sleep was 52.57%.  RESPIRATORY PARAMETERS The overall apnea/hypopnea index (AHI) was 11.8 per hour. There were 8 total apneas, including 7 obstructive, 1 central and 0 mixed apneas. There were 62 hypopneas and 6 RERAs.  The AHI during Stage REM sleep was 31.6 per hour.  AHI while supine was 14.4 per hour.  The mean oxygen saturation was 91.9%. The minimum SpO2 during sleep was 82.0%.  moderate snoring was  noted during this study.  CARDIAC DATA The 2 lead EKG demonstrated sinus rhythm. The mean heart rate was 70.2 beats per minute. Other EKG findings include: None.  LEG MOVEMENT DATA The total PLMS were 0 with a resulting PLMS index of 0.0. Associated arousal with leg movement index was 0.0 .  IMPRESSIONS - Mild obstructive sleep apnea occurred during this study (AHI = 11.8/h). - Insufficient early events to meet protocol requirements for split CPAP titration. - No significant central sleep apnea occurred during this study (CAI = 0.2/h). - Mild oxygen desaturation was noted during this study (Min O2 = 82.0%). Mean sat 91.9%.  - The patient snored with moderate snoring volume. - No cardiac abnormalities were noted during this study. - Clinically significant periodic limb movements did not occur during sleep. No significant associated arousals.  DIAGNOSIS - Obstructive Sleep Apnea (327.23 [G47.33 ICD-10])  RECOMMENDATIONS - Suggest CPAP titration sleep study ior autopap. Other options would be based on clinical judgment. - Be careful with alcohol, sedatives and other CNS depressants that may worsen sleep apnea and disrupt normal sleep architecture. - Sleep hygiene should be reviewed to assess factors that may improve sleep quality. - Weight management and regular exercise should be initiated or continued if appropriate.  [Electronically signed] 04/08/2019 01:03 PM  Baird Lyons MD, [place], American Board of Sleep Medicine   NPI: [id number]                          [place], [place] [facility]  ELECTRONICALLY SIGNED ON:  04/08/2019, 1:00 PM [place] PH: [phone]   FX: [phone] [place]

## 2019-04-10 DIAGNOSIS — G8929 Other chronic pain: Secondary | ICD-10-CM | POA: Diagnosis not present

## 2019-04-10 DIAGNOSIS — M25562 Pain in left knee: Secondary | ICD-10-CM | POA: Diagnosis not present

## 2019-04-15 DIAGNOSIS — J449 Chronic obstructive pulmonary disease, unspecified: Secondary | ICD-10-CM | POA: Diagnosis not present

## 2019-05-01 DIAGNOSIS — R7303 Prediabetes: Secondary | ICD-10-CM | POA: Diagnosis not present

## 2019-05-01 DIAGNOSIS — R7989 Other specified abnormal findings of blood chemistry: Secondary | ICD-10-CM | POA: Diagnosis not present

## 2019-05-01 DIAGNOSIS — E785 Hyperlipidemia, unspecified: Secondary | ICD-10-CM | POA: Diagnosis not present

## 2019-05-02 ENCOUNTER — Ambulatory Visit: Payer: Medicare Other | Admitting: Internal Medicine

## 2019-05-02 DIAGNOSIS — M94262 Chondromalacia, left knee: Secondary | ICD-10-CM | POA: Diagnosis not present

## 2019-05-02 DIAGNOSIS — M659 Synovitis and tenosynovitis, unspecified: Secondary | ICD-10-CM | POA: Diagnosis not present

## 2019-05-02 DIAGNOSIS — M65862 Other synovitis and tenosynovitis, left lower leg: Secondary | ICD-10-CM | POA: Diagnosis not present

## 2019-05-02 DIAGNOSIS — S83222A Peripheral tear of medial meniscus, current injury, left knee, initial encounter: Secondary | ICD-10-CM | POA: Diagnosis not present

## 2019-05-02 DIAGNOSIS — M6752 Plica syndrome, left knee: Secondary | ICD-10-CM | POA: Diagnosis not present

## 2019-05-02 DIAGNOSIS — M23252 Derangement of posterior horn of lateral meniscus due to old tear or injury, left knee: Secondary | ICD-10-CM | POA: Diagnosis not present

## 2019-05-07 DIAGNOSIS — R262 Difficulty in walking, not elsewhere classified: Secondary | ICD-10-CM | POA: Diagnosis not present

## 2019-05-07 DIAGNOSIS — M25562 Pain in left knee: Secondary | ICD-10-CM | POA: Diagnosis not present

## 2019-05-07 DIAGNOSIS — M25662 Stiffness of left knee, not elsewhere classified: Secondary | ICD-10-CM | POA: Diagnosis not present

## 2019-05-07 DIAGNOSIS — S83207D Unspecified tear of unspecified meniscus, current injury, left knee, subsequent encounter: Secondary | ICD-10-CM | POA: Diagnosis not present

## 2019-05-07 DIAGNOSIS — Z4789 Encounter for other orthopedic aftercare: Secondary | ICD-10-CM | POA: Diagnosis not present

## 2019-05-08 DIAGNOSIS — Z9889 Other specified postprocedural states: Secondary | ICD-10-CM

## 2019-05-08 DIAGNOSIS — M25562 Pain in left knee: Secondary | ICD-10-CM | POA: Diagnosis not present

## 2019-05-08 DIAGNOSIS — M79662 Pain in left lower leg: Secondary | ICD-10-CM | POA: Diagnosis not present

## 2019-05-08 HISTORY — DX: Other specified postprocedural states: Z98.890

## 2019-05-14 DIAGNOSIS — Z4789 Encounter for other orthopedic aftercare: Secondary | ICD-10-CM | POA: Diagnosis not present

## 2019-05-14 DIAGNOSIS — M25562 Pain in left knee: Secondary | ICD-10-CM | POA: Diagnosis not present

## 2019-05-14 DIAGNOSIS — S83207D Unspecified tear of unspecified meniscus, current injury, left knee, subsequent encounter: Secondary | ICD-10-CM | POA: Diagnosis not present

## 2019-05-14 DIAGNOSIS — M25662 Stiffness of left knee, not elsewhere classified: Secondary | ICD-10-CM | POA: Diagnosis not present

## 2019-05-14 DIAGNOSIS — R262 Difficulty in walking, not elsewhere classified: Secondary | ICD-10-CM | POA: Diagnosis not present

## 2019-05-15 DIAGNOSIS — E785 Hyperlipidemia, unspecified: Secondary | ICD-10-CM | POA: Diagnosis not present

## 2019-05-15 DIAGNOSIS — J449 Chronic obstructive pulmonary disease, unspecified: Secondary | ICD-10-CM | POA: Diagnosis not present

## 2019-05-15 DIAGNOSIS — E1159 Type 2 diabetes mellitus with other circulatory complications: Secondary | ICD-10-CM | POA: Diagnosis not present

## 2019-05-15 DIAGNOSIS — I10 Essential (primary) hypertension: Secondary | ICD-10-CM | POA: Diagnosis not present

## 2019-05-15 DIAGNOSIS — E1169 Type 2 diabetes mellitus with other specified complication: Secondary | ICD-10-CM | POA: Diagnosis not present

## 2019-05-18 DIAGNOSIS — Z4789 Encounter for other orthopedic aftercare: Secondary | ICD-10-CM | POA: Diagnosis not present

## 2019-05-18 DIAGNOSIS — M25562 Pain in left knee: Secondary | ICD-10-CM | POA: Diagnosis not present

## 2019-05-18 DIAGNOSIS — S83207D Unspecified tear of unspecified meniscus, current injury, left knee, subsequent encounter: Secondary | ICD-10-CM | POA: Diagnosis not present

## 2019-05-18 DIAGNOSIS — R262 Difficulty in walking, not elsewhere classified: Secondary | ICD-10-CM | POA: Diagnosis not present

## 2019-05-18 DIAGNOSIS — M25662 Stiffness of left knee, not elsewhere classified: Secondary | ICD-10-CM | POA: Diagnosis not present

## 2019-05-21 DIAGNOSIS — S83207D Unspecified tear of unspecified meniscus, current injury, left knee, subsequent encounter: Secondary | ICD-10-CM | POA: Diagnosis not present

## 2019-05-21 DIAGNOSIS — R262 Difficulty in walking, not elsewhere classified: Secondary | ICD-10-CM | POA: Diagnosis not present

## 2019-05-21 DIAGNOSIS — Z4789 Encounter for other orthopedic aftercare: Secondary | ICD-10-CM | POA: Diagnosis not present

## 2019-05-21 DIAGNOSIS — M25662 Stiffness of left knee, not elsewhere classified: Secondary | ICD-10-CM | POA: Diagnosis not present

## 2019-05-21 DIAGNOSIS — M25562 Pain in left knee: Secondary | ICD-10-CM | POA: Diagnosis not present

## 2019-05-23 DIAGNOSIS — R262 Difficulty in walking, not elsewhere classified: Secondary | ICD-10-CM | POA: Diagnosis not present

## 2019-05-23 DIAGNOSIS — M25662 Stiffness of left knee, not elsewhere classified: Secondary | ICD-10-CM | POA: Diagnosis not present

## 2019-05-23 DIAGNOSIS — S83207D Unspecified tear of unspecified meniscus, current injury, left knee, subsequent encounter: Secondary | ICD-10-CM | POA: Diagnosis not present

## 2019-05-23 DIAGNOSIS — Z4789 Encounter for other orthopedic aftercare: Secondary | ICD-10-CM | POA: Diagnosis not present

## 2019-05-23 DIAGNOSIS — M25562 Pain in left knee: Secondary | ICD-10-CM | POA: Diagnosis not present

## 2019-05-25 DIAGNOSIS — R262 Difficulty in walking, not elsewhere classified: Secondary | ICD-10-CM | POA: Diagnosis not present

## 2019-05-25 DIAGNOSIS — Z4789 Encounter for other orthopedic aftercare: Secondary | ICD-10-CM | POA: Diagnosis not present

## 2019-05-25 DIAGNOSIS — S83207D Unspecified tear of unspecified meniscus, current injury, left knee, subsequent encounter: Secondary | ICD-10-CM | POA: Diagnosis not present

## 2019-05-25 DIAGNOSIS — M25562 Pain in left knee: Secondary | ICD-10-CM | POA: Diagnosis not present

## 2019-05-25 DIAGNOSIS — M25662 Stiffness of left knee, not elsewhere classified: Secondary | ICD-10-CM | POA: Diagnosis not present

## 2019-05-28 DIAGNOSIS — M25562 Pain in left knee: Secondary | ICD-10-CM | POA: Diagnosis not present

## 2019-05-28 DIAGNOSIS — M25662 Stiffness of left knee, not elsewhere classified: Secondary | ICD-10-CM | POA: Diagnosis not present

## 2019-05-28 DIAGNOSIS — R262 Difficulty in walking, not elsewhere classified: Secondary | ICD-10-CM | POA: Diagnosis not present

## 2019-05-28 DIAGNOSIS — Z4789 Encounter for other orthopedic aftercare: Secondary | ICD-10-CM | POA: Diagnosis not present

## 2019-05-28 DIAGNOSIS — S83207D Unspecified tear of unspecified meniscus, current injury, left knee, subsequent encounter: Secondary | ICD-10-CM | POA: Diagnosis not present

## 2019-05-31 DIAGNOSIS — S83207D Unspecified tear of unspecified meniscus, current injury, left knee, subsequent encounter: Secondary | ICD-10-CM | POA: Diagnosis not present

## 2019-05-31 DIAGNOSIS — M25562 Pain in left knee: Secondary | ICD-10-CM | POA: Diagnosis not present

## 2019-05-31 DIAGNOSIS — M25662 Stiffness of left knee, not elsewhere classified: Secondary | ICD-10-CM | POA: Diagnosis not present

## 2019-05-31 DIAGNOSIS — Z4789 Encounter for other orthopedic aftercare: Secondary | ICD-10-CM | POA: Diagnosis not present

## 2019-05-31 DIAGNOSIS — R262 Difficulty in walking, not elsewhere classified: Secondary | ICD-10-CM | POA: Diagnosis not present

## 2019-06-01 ENCOUNTER — Telehealth: Payer: Self-pay

## 2019-06-01 DIAGNOSIS — M25662 Stiffness of left knee, not elsewhere classified: Secondary | ICD-10-CM | POA: Diagnosis not present

## 2019-06-01 DIAGNOSIS — S83207D Unspecified tear of unspecified meniscus, current injury, left knee, subsequent encounter: Secondary | ICD-10-CM | POA: Diagnosis not present

## 2019-06-01 DIAGNOSIS — M25562 Pain in left knee: Secondary | ICD-10-CM | POA: Diagnosis not present

## 2019-06-01 DIAGNOSIS — R262 Difficulty in walking, not elsewhere classified: Secondary | ICD-10-CM | POA: Diagnosis not present

## 2019-06-01 DIAGNOSIS — G4733 Obstructive sleep apnea (adult) (pediatric): Secondary | ICD-10-CM

## 2019-06-01 DIAGNOSIS — Z4789 Encounter for other orthopedic aftercare: Secondary | ICD-10-CM | POA: Diagnosis not present

## 2019-06-01 NOTE — Telephone Encounter (Signed)
Called and spoke to patient. Relayed results per Dr. Annamaria Boots. Patient verbalized understanding. Order placed for CPAP per Dr. Annamaria Boots. Nothing further needed at this time.

## 2019-06-01 NOTE — Telephone Encounter (Signed)
-----   Message from Deneise Lever, MD sent at 06/01/2019  9:57 AM EST ----- Her sleep study showed obstructive sleep apnea, averaging almost 12 apneas/ hour, with drops in blood oxygen level. I recommend we order new DME, new CPAP auto 5-15, mask of choice, humidifier, supplies, a/irView/ card Please make sure she has a return appointment in 31-90 days- ok to use a held spot if [place].

## 2019-06-04 DIAGNOSIS — S83207D Unspecified tear of unspecified meniscus, current injury, left knee, subsequent encounter: Secondary | ICD-10-CM | POA: Diagnosis not present

## 2019-06-04 DIAGNOSIS — M25662 Stiffness of left knee, not elsewhere classified: Secondary | ICD-10-CM | POA: Diagnosis not present

## 2019-06-04 DIAGNOSIS — R262 Difficulty in walking, not elsewhere classified: Secondary | ICD-10-CM | POA: Diagnosis not present

## 2019-06-04 DIAGNOSIS — Z4789 Encounter for other orthopedic aftercare: Secondary | ICD-10-CM | POA: Diagnosis not present

## 2019-06-04 DIAGNOSIS — M25562 Pain in left knee: Secondary | ICD-10-CM | POA: Diagnosis not present

## 2019-06-05 DIAGNOSIS — E785 Hyperlipidemia, unspecified: Secondary | ICD-10-CM | POA: Diagnosis not present

## 2019-06-05 DIAGNOSIS — E1169 Type 2 diabetes mellitus with other specified complication: Secondary | ICD-10-CM | POA: Diagnosis not present

## 2019-06-06 ENCOUNTER — Telehealth: Payer: Self-pay | Admitting: Internal Medicine

## 2019-06-06 DIAGNOSIS — Z4789 Encounter for other orthopedic aftercare: Secondary | ICD-10-CM | POA: Diagnosis not present

## 2019-06-06 DIAGNOSIS — R262 Difficulty in walking, not elsewhere classified: Secondary | ICD-10-CM | POA: Diagnosis not present

## 2019-06-06 DIAGNOSIS — M25662 Stiffness of left knee, not elsewhere classified: Secondary | ICD-10-CM | POA: Diagnosis not present

## 2019-06-06 DIAGNOSIS — S83207D Unspecified tear of unspecified meniscus, current injury, left knee, subsequent encounter: Secondary | ICD-10-CM | POA: Diagnosis not present

## 2019-06-06 DIAGNOSIS — M25562 Pain in left knee: Secondary | ICD-10-CM | POA: Diagnosis not present

## 2019-06-06 DIAGNOSIS — G4733 Obstructive sleep apnea (adult) (pediatric): Secondary | ICD-10-CM

## 2019-06-06 NOTE — Telephone Encounter (Signed)
New order has been placed for [facility]. Spoke with patient. She is aware that the new order has been placed.   Nothing further needed at time of call.

## 2019-06-06 NOTE — Telephone Encounter (Signed)
Since the order has already been closed, please issue new order stating patient would ike to use [facility] as the DME, please.

## 2019-06-06 NOTE — Telephone Encounter (Signed)
Pt called stating she would like to get her cpap supplies through [facility] instead of [facility]. PCCs, please advise if we need to place a new order or if the original one that was placed will still work.

## 2019-06-14 DIAGNOSIS — R05 Cough: Secondary | ICD-10-CM | POA: Diagnosis not present

## 2019-06-15 DIAGNOSIS — J449 Chronic obstructive pulmonary disease, unspecified: Secondary | ICD-10-CM | POA: Diagnosis not present

## 2019-06-18 DIAGNOSIS — R262 Difficulty in walking, not elsewhere classified: Secondary | ICD-10-CM | POA: Diagnosis not present

## 2019-06-18 DIAGNOSIS — M25662 Stiffness of left knee, not elsewhere classified: Secondary | ICD-10-CM | POA: Diagnosis not present

## 2019-06-18 DIAGNOSIS — S83207D Unspecified tear of unspecified meniscus, current injury, left knee, subsequent encounter: Secondary | ICD-10-CM | POA: Diagnosis not present

## 2019-06-18 DIAGNOSIS — M25562 Pain in left knee: Secondary | ICD-10-CM | POA: Diagnosis not present

## 2019-06-18 DIAGNOSIS — Z4789 Encounter for other orthopedic aftercare: Secondary | ICD-10-CM | POA: Diagnosis not present

## 2019-06-21 DIAGNOSIS — Z20822 Contact with and (suspected) exposure to covid-19: Secondary | ICD-10-CM | POA: Diagnosis not present

## 2019-06-21 DIAGNOSIS — J45909 Unspecified asthma, uncomplicated: Secondary | ICD-10-CM | POA: Diagnosis not present

## 2019-07-11 DIAGNOSIS — G4733 Obstructive sleep apnea (adult) (pediatric): Secondary | ICD-10-CM | POA: Diagnosis not present

## 2019-07-16 DIAGNOSIS — J449 Chronic obstructive pulmonary disease, unspecified: Secondary | ICD-10-CM | POA: Diagnosis not present

## 2019-08-01 DIAGNOSIS — Z1231 Encounter for screening mammogram for malignant neoplasm of breast: Secondary | ICD-10-CM | POA: Diagnosis not present

## 2019-08-09 ENCOUNTER — Ambulatory Visit: Payer: Medicare Other | Admitting: Internal Medicine

## 2019-08-11 DIAGNOSIS — G4733 Obstructive sleep apnea (adult) (pediatric): Secondary | ICD-10-CM | POA: Diagnosis not present

## 2019-08-13 DIAGNOSIS — J449 Chronic obstructive pulmonary disease, unspecified: Secondary | ICD-10-CM | POA: Diagnosis not present

## 2019-08-17 DIAGNOSIS — G4733 Obstructive sleep apnea (adult) (pediatric): Secondary | ICD-10-CM | POA: Diagnosis not present

## 2019-08-28 ENCOUNTER — Encounter: Payer: Self-pay | Admitting: Internal Medicine

## 2019-08-28 DIAGNOSIS — Z139 Encounter for screening, unspecified: Secondary | ICD-10-CM | POA: Diagnosis not present

## 2019-08-28 DIAGNOSIS — R7303 Prediabetes: Secondary | ICD-10-CM | POA: Diagnosis not present

## 2019-08-28 DIAGNOSIS — Z0000 Encounter for general adult medical examination without abnormal findings: Secondary | ICD-10-CM | POA: Diagnosis not present

## 2019-08-28 DIAGNOSIS — E785 Hyperlipidemia, unspecified: Secondary | ICD-10-CM | POA: Diagnosis not present

## 2019-08-28 DIAGNOSIS — R7989 Other specified abnormal findings of blood chemistry: Secondary | ICD-10-CM | POA: Diagnosis not present

## 2019-08-28 DIAGNOSIS — Z7189 Other specified counseling: Secondary | ICD-10-CM | POA: Diagnosis not present

## 2019-08-28 DIAGNOSIS — Z136 Encounter for screening for cardiovascular disorders: Secondary | ICD-10-CM | POA: Diagnosis not present

## 2019-08-30 ENCOUNTER — Ambulatory Visit (INDEPENDENT_AMBULATORY_CARE_PROVIDER_SITE_OTHER): Payer: Medicare Other | Admitting: Internal Medicine

## 2019-08-30 ENCOUNTER — Encounter: Payer: Self-pay | Admitting: Internal Medicine

## 2019-08-30 ENCOUNTER — Other Ambulatory Visit: Payer: Self-pay

## 2019-08-30 VITALS — BP 122/76 | HR 79 | Temp 97.5°F | Ht 64.0 in | Wt 284.0 lb

## 2019-08-30 DIAGNOSIS — F5101 Primary insomnia: Secondary | ICD-10-CM | POA: Diagnosis not present

## 2019-08-30 DIAGNOSIS — J4521 Mild intermittent asthma with (acute) exacerbation: Secondary | ICD-10-CM

## 2019-08-30 DIAGNOSIS — G4733 Obstructive sleep apnea (adult) (pediatric): Secondary | ICD-10-CM

## 2019-08-30 DIAGNOSIS — G4700 Insomnia, unspecified: Secondary | ICD-10-CM

## 2019-08-30 HISTORY — DX: Insomnia, unspecified: G47.00

## 2019-08-30 LAB — CBC WITH DIFFERENTIAL/PLATELET
Basophils Absolute: 0.1 10*3/uL (ref 0.0–0.1)
Basophils Relative: 1 % (ref 0.0–3.0)
Eosinophils Absolute: 0.5 10*3/uL (ref 0.0–0.7)
Eosinophils Relative: 5.8 % — ABNORMAL HIGH (ref 0.0–5.0)
HCT: 39.8 % (ref 36.0–46.0)
Hemoglobin: 13.2 g/dL (ref 12.0–15.0)
Lymphocytes Relative: 30.2 % (ref 12.0–46.0)
Lymphs Abs: 2.6 10*3/uL (ref 0.7–4.0)
MCHC: 33.2 g/dL (ref 30.0–36.0)
MCV: 91.2 fl (ref 78.0–100.0)
Monocytes Absolute: 0.6 10*3/uL (ref 0.1–1.0)
Monocytes Relative: 7.5 % (ref 3.0–12.0)
Neutro Abs: 4.7 10*3/uL (ref 1.4–7.7)
Neutrophils Relative %: 55.5 % (ref 43.0–77.0)
Platelets: 270 10*3/uL (ref 150.0–400.0)
RBC: 4.36 Mil/uL (ref 3.87–5.11)
RDW: 14.2 % (ref 11.5–15.5)
WBC: 8.6 10*3/uL (ref 4.0–10.5)

## 2019-08-30 MED ORDER — TEMAZEPAM 15 MG PO CAPS: 15.0000 mg | ORAL_CAPSULE | Freq: Every evening | ORAL | 3 refills | Status: DC | PRN

## 2019-08-30 MED ORDER — AZITHROMYCIN 250 MG PO TABS: ORAL_TABLET | ORAL | 0 refills | Status: DC

## 2019-08-30 NOTE — Assessment & Plan Note (Signed)
Doing well with CPAP this time around. Discussed download, comfort. Plan- continue auto 5-15

## 2019-08-30 NOTE — Progress Notes (Signed)
03/21/2019- 70 yoF never smoker for sleep evaluation referred by Veronica Leblanc (PCP - [facility]) for OSA, reports having home sleep test spring 2020 - result being requested. Medical problem list includes CVA, OSA, Endometriosis, Asthma, Allergic Rhinitis, HBP,  NPSG [facility] 05/05/10- AHI 9.6/ hr, RDI 13.2/ hr, desaturation to 77%. Subsequent CPAP titration to 8 Has CPAP at home, not using. Body weight today 275 lbs Epworth score 17 Declines flu vax Her concerns are with difficulty initiating and maintaining sleep. Using melatonin with no prescription sleep med in past. Restless sleep with frequent brief waking, somatic discomforts, bathroom. Never feels rested. Daughter watching her tells her snoring and frequent apneas.  Naps help some. Occasional caffeine. 20 lb weight increase 2 years. Couldn't get comfortable with CPAP mask in 2012, pulled off in sleep.  She reports home sleep test this Spring didn't show much apnea but she did desaturate.  Bedtime 1-2AM, up 10-11AM.  Has had several C-spine surgeries, no Throat surgery. Has home O2 2L / [facility] for sleep but didn't change how she feels so not using.   08/30/19- 59 yoF never smoker followed for OSA, Insomnia, complicated by CVA, OSA, Endometriosis, Asthma, Allergic Rhinitis, HBP,  CPAP  Auto 5-15/ Home O2 sleep/ [facility] Download compliance 93%, AHI 0.4/ hr Body weight today 284 lbs Singulair, melatonin, Vistaril, Zyrtec, Symbicort 160, albuterol hfa Doing well with CPAP. Still some difficulty with initiating and maintaining sleep. Melatonin 9 mg insufficient.  Asks we manage asthma- Hx since childhood. Seasonal and perennial rhinitis correlates. Not aware reflux. No problem with aspirin. Triggers also include strong smells and smoke.  Neighbor in her "nonsmoking" apartment smokes, and odor comes through.  Worse in past 6 weeks with frequent cough, white/ yellow, no fever. Reports CXR clear at [facility]. Using  rescue 0-4x daily. Allergy shots remote.     ROS-see HPI   + = positive Constitutional:    weight loss, night sweats, fevers, chills, +fatigue, lassitude. HEENT:    +headaches, difficulty swallowing, tooth/dental problems, sore throat,       +sneezing, itching, ear ache, +nasal congestion, post nasal drip, snoring CV:    chest pain, orthopnea, PND, +swelling in lower extremities, anasarca,                                  dizziness, palpitations Resp:   shortness of breath with exertion or at rest.                +productive cough,   +non-productive cough, coughing up of blood.              change in color of mucus.  +wheezing.   Skin:    rash or lesions. GI:  No-   heartburn, indigestion, abdominal pain, nausea, vomiting, diarrhea,                 change in bowel habits, loss of appetite GU: dysuria, change in color of urine, no urgency or frequency.   flank pain. MS:   joint pain, stiffness, decreased range of motion, back pain. Neuro-     nothing unusual Psych:  change in mood or affect.  depression or anxiety.   memory loss.  OBJ- Physical Exam General- Alert, Oriented, Affect-appropriate, Distress- none acute, + obese Skin- rash-none, lesions- none, excoriation- none Lymphadenopathy- none Head- atraumatic            Eyes- Gross vision intact,  PERRLA, conjunctivae and secretions clear            Ears- Hearing, canals-normal            Nose- Clear, no-Septal dev, mucus, polyps, erosion, perforation             Throat- Mallampati III , mucosa clear , drainage- none, tonsils- atrophic,  + many missing teeth Neck- flexible , trachea midline, no stridor , thyroid nl, carotid no bruit Chest - symmetrical excursion , unlabored           Heart/CV- RRR , no murmur , no gallop  , no rub, nl s1 s2                           - JVD- none , edema- none, stasis changes- none, varices- none           Lung- clear to P&A, wheeze- none, cough- none , dullness-none, rub- none           Chest wall-   Abd-  Br/ Gen/ Rectal- Not done, not indicated Extrem- cyanosis- none, clubbing, none, atrophy- none, strength- nl Neuro- grossly intact to observation

## 2019-08-30 NOTE — Assessment & Plan Note (Signed)
Discussed sleep hygiene, effect of CPAP. Plan- add temazepam for trial- discussed.

## 2019-08-30 NOTE — Patient Instructions (Addendum)
Order- lab- CBC w diff, IgE   Dx Asthma exacerbation  Script for Zpak sent  Script sent for Temazepam 15 mg- 1 for sleep if needed  Continue Symbicort and your rescue inhaler for now, while we see what difference the Zpak makes.   We can continue CPAP auto 5-15  Please call as needed

## 2019-08-30 NOTE — Assessment & Plan Note (Signed)
Sounds as if she may have a bronchitis component now. Will treat this with Zpak first, then assess if we need to change inhalers.

## 2019-08-31 LAB — IGE: IgE (Immunoglobulin E), Serum: 102 kU/L (ref <=114)

## 2019-09-04 DIAGNOSIS — E1159 Type 2 diabetes mellitus with other circulatory complications: Secondary | ICD-10-CM | POA: Diagnosis not present

## 2019-09-04 DIAGNOSIS — E1169 Type 2 diabetes mellitus with other specified complication: Secondary | ICD-10-CM | POA: Diagnosis not present

## 2019-09-04 DIAGNOSIS — I152 Hypertension secondary to endocrine disorders: Secondary | ICD-10-CM | POA: Diagnosis not present

## 2019-09-04 DIAGNOSIS — E785 Hyperlipidemia, unspecified: Secondary | ICD-10-CM | POA: Diagnosis not present

## 2019-09-08 DIAGNOSIS — G4733 Obstructive sleep apnea (adult) (pediatric): Secondary | ICD-10-CM | POA: Diagnosis not present

## 2019-09-13 DIAGNOSIS — J449 Chronic obstructive pulmonary disease, unspecified: Secondary | ICD-10-CM | POA: Diagnosis not present

## 2019-09-13 DIAGNOSIS — G5603 Carpal tunnel syndrome, bilateral upper limbs: Secondary | ICD-10-CM | POA: Diagnosis not present

## 2019-09-13 DIAGNOSIS — M542 Cervicalgia: Secondary | ICD-10-CM | POA: Diagnosis not present

## 2019-09-13 DIAGNOSIS — G8929 Other chronic pain: Secondary | ICD-10-CM | POA: Diagnosis not present

## 2019-09-18 ENCOUNTER — Encounter: Payer: Self-pay | Admitting: Cardiology

## 2019-09-18 ENCOUNTER — Ambulatory Visit (INDEPENDENT_AMBULATORY_CARE_PROVIDER_SITE_OTHER): Payer: Medicare Other

## 2019-09-18 ENCOUNTER — Other Ambulatory Visit: Payer: Self-pay

## 2019-09-18 ENCOUNTER — Ambulatory Visit (INDEPENDENT_AMBULATORY_CARE_PROVIDER_SITE_OTHER): Payer: Medicare Other | Admitting: Cardiology

## 2019-09-18 VITALS — BP 108/64 | HR 86 | Ht 64.0 in | Wt 273.4 lb

## 2019-09-18 DIAGNOSIS — R0609 Other forms of dyspnea: Secondary | ICD-10-CM

## 2019-09-18 DIAGNOSIS — I6930 Unspecified sequelae of cerebral infarction: Secondary | ICD-10-CM

## 2019-09-18 DIAGNOSIS — R002 Palpitations: Secondary | ICD-10-CM

## 2019-09-18 DIAGNOSIS — G4733 Obstructive sleep apnea (adult) (pediatric): Secondary | ICD-10-CM

## 2019-09-18 DIAGNOSIS — E119 Type 2 diabetes mellitus without complications: Secondary | ICD-10-CM

## 2019-09-18 DIAGNOSIS — R0600 Dyspnea, unspecified: Secondary | ICD-10-CM

## 2019-09-18 HISTORY — DX: Type 2 diabetes mellitus without complications: E11.9

## 2019-09-18 HISTORY — DX: Other forms of dyspnea: R06.09

## 2019-09-18 HISTORY — DX: Unspecified sequelae of cerebral infarction: I69.30

## 2019-09-18 HISTORY — DX: Dyspnea, unspecified: R06.00

## 2019-09-18 HISTORY — DX: Palpitations: R00.2

## 2019-09-18 NOTE — Progress Notes (Signed)
Cardiology Consultation:    Date:  09/18/2019   ID:  [name], DOB [birth year], MRN [id number]  PCP:  Janeice Robinson, PA-C  Cardiologist:  Jenne Campus, MD   Referring MD: Janeice Robinson,*   No chief complaint on file.  Heart palpitations  History of Present Illness:    Veronica Leblanc is a 59 y.o. female who is being seen today for the evaluation of palpitations at the request of Janeice Robinson,*.  Quite interesting story she is experiencing palpitations few months episode of palpitation last for few minutes.  Usually she gets mild shortness of breath but no chest pain with this sensation.  She does not feel like she is not hostile that she presented easy.  She has been getting this sensation for last few months.  She does have past medical history significant for CVA.  She told me that she did have a stroke when she was in her 37s apparently she started having some problems she was taken to the hospital and she was told to have a stroke.  No investigations done after that.  She tells me that she does have numerous TIAs when asked her what she means by that she described it back when she got her MRI of her brain done there were multiple lesions in the brain.  Apparently is not much was done about that at this time.  Recently she has been diagnosed with diabetes and she was put on appropriate medication she is trying to walk something to improve her weight.  She described to have some fatigue tiredness as well as shortness of breath.  In terms of episode of palpitations she remember 1 time when she was walking and she started feeling palpitations.  She had to sit down lasting for few minutes and palpitation when awake she cannot tell me if palpitations are regular or irregular.  She does have some additional episode of palpitations but does have happening at different scenarios.  She did for 48 hours monitor which did not show any significant arrhythmia.  Her  mother did have atrial fibrillation and she is worried that she may have the same. She smoked only for a short period of time when she was teenager Recently diagnosed with diabetes , History of premature coronary artery disease as well as atrial fibrillation  Past Medical History:  Diagnosis Date  . Acute, but ill-defined, cerebrovascular disease   . Arthritis   . Asthma   . CVA (cerebral vascular accident) ([place])   . CVA (cerebral vascular accident) ([place])    Age 64  . Endometriosis, site unspecified   . History of migraine headaches   . OSA (obstructive sleep apnea)     Past Surgical History:  Procedure Laterality Date  . APPENDECTOMY    . CESAREAN SECTION    . CHOLECYSTECTOMY    . ganglionectomy     c2/c3 right and left side  . NECK SURGERY     5 times  . presacral neurectomy    . SALPINGOOPHORECTOMY     left  . TOTAL ABDOMINAL HYSTERECTOMY      Current Medications: Current Meds  Medication Sig  . albuterol (PROVENTIL HFA) 108 (90 BASE) MCG/ACT inhaler Inhale 2 puffs into the lungs every 6 (six) hours as needed.    Marland Kitchen azithromycin (ZITHROMAX) 250 MG tablet 2 today then one daily  . budesonide-formoterol (SYMBICORT) 160-4.5 MCG/ACT inhaler Inhale 2 puffs into the lungs 2 (two) times daily.  . cetirizine (ZYRTEC) 10 MG  tablet Take 10 mg by mouth daily.    . citalopram (CELEXA) 40 MG tablet Take 40 mg by mouth daily. Takes 1 tablet at bedtime.  . furosemide (LASIX) 20 MG tablet Take 20 mg by mouth daily. Takes 1/2 tablet dailey as needed for edema.  . hydrOXYzine (VISTARIL) 25 MG capsule TAKE 1 TO 2 CAPSULES EVERY 6 HOURS AS NEEDED FOR ANXIETY  . ibuprofen (IBU) 800 MG tablet Take 800 mg by mouth every 8 (eight) hours as needed.    Marland Kitchen JARDIANCE 10 MG TABS tablet Take 10 mg by mouth daily.  Marland Kitchen lisinopril (ZESTRIL) 40 MG tablet Take 40 mg by mouth daily.  . Melatonin 1 MG TABS Take 9 mg by mouth at bedtime.  . metoprolol tartrate (LOPRESSOR) 25 MG tablet Take 25 mg by mouth  daily.  . montelukast (SINGULAIR) 10 MG tablet Take 10 mg by mouth at bedtime.  . nabumetone (RELAFEN) 750 MG tablet Take by mouth.  . rosuvastatin (CRESTOR) 20 MG tablet Take 20 mg by mouth at bedtime.  . temazepam (RESTORIL) 15 MG capsule Take 1 capsule (15 mg total) by mouth at bedtime as needed for sleep.  . [DISCONTINUED] rosuvastatin (CRESTOR) 10 MG tablet Take 10 mg by mouth daily.     Allergies:   Butorphanol, Metoclopramide, Other, Amitriptyline hcl, Cefazolin, Ceftriaxone sodium, Ciprofloxacin, Darvocet [propoxyphene n-acetaminophen], Penicillins, Prochlorperazine edisylate, Promethazine hcl, Sulfamethoxazole, Sulfonamide derivatives, and Sumatriptan   Social History   Socioeconomic History  . Marital status: Divorced    Spouse name: Not on file  . Number of children: Not on file  . Years of education: Not on file  . Highest education level: Not on file  Occupational History  . 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 Smoking status: Never Smoker  . Smokeless tobacco: Never Used  Substance and Sexual Activity  . Alcohol use: Not on file  . Drug use: Not on file  . Sexual activity: Not on file  Other Topics Concern  . Not on file  Social History Narrative  . Not on file   Social Determinants of Health   Financial Resource Strain:   . Difficulty of Paying Living Expenses:   Food Insecurity:   . Worried About Programme researcher, broadcasting/film/video in the Last Year:   . Barista in the Last Year:   Transportation Needs:   . Freight forwarder (Medical):   Marland Kitchen Lack of Transportation (Non-Medical):   Physical Activity:   . Days of Exercise per Week:   . Minutes of Exercise per Session:   Stress:   . Feeling of Stress :   Social Connections:   . Frequency of Communication with Friends and Family:   . Frequency of Social Gatherings with Friends and Family:   . Attends Religious Services:   . Active Member of Clubs or Organizations:   . Attends Tax inspector Meetings:   Marland Kitchen Marital Status:      Family History: The patient's family history includes Breast cancer in her maternal aunt; Colon cancer in her cousin; Diabetes in her brother and father; Heart disease in her maternal grandfather and paternal grandfather; Hypertension in her brother, father, and mother; Pancreatic cancer in her maternal grandmother. ROS:   Please see the history of present illness.    All 14 point review of systems negative except as described per history of present illness.  EKGs/Labs/Other Studies Reviewed:    The following studies  were reviewed today:     Recent Labs: 08/30/2019: Hemoglobin 13.2; Platelets 270.0  Recent Lipid Panel No results found for: CHOL, TRIG, HDL, CHOLHDL, VLDL, LDLCALC, LDLDIRECT  Physical Exam:    VS:  BP 108/64   Pulse 86   Ht 5\' 4"  (1.626 m)   Wt 273 lb 6.4 oz (124 kg)   LMP  (LMP Unknown)   SpO2 96%   BMI 46.93 kg/m     Wt Readings from Last 3 Encounters:  09/18/19 273 lb 6.4 oz (124 kg)  08/30/19 284 lb (128.8 kg)  04/03/19 275 lb (124.7 kg)     GEN:  Well nourished, well developed in no acute distress HEENT: Normal NECK: No JVD; No carotid bruits LYMPHATICS: No lymphadenopathy CARDIAC: RRR, no murmurs, no rubs, no gallops RESPIRATORY:  Clear to auscultation without rales, wheezing or rhonchi  ABDOMEN: Soft, non-tender, non-distended MUSCULOSKELETAL:  No edema; No deformity  SKIN: Warm and dry NEUROLOGIC:  Alert and oriented x 3 PSYCHIATRIC:  Normal affect   ASSESSMENT:    1. Obstructive sleep apnea   2. Palpitations   3. Dyspnea on exertion   4. Late effect of cerebrovascular accident (CVA)   5. Type 2 diabetes mellitus without complication, without long-term current use of insulin (HCC)    PLAN:    In order of problems listed above:  1. Palpitations.  I will ask you to wear Zio patch for 2 weeks.  I also initiated conversation about potentially having implantable loop recorder.  This is  important especially in view of this time it looks like she did have a CVA before.  I will also ask him to have an echocardiogram to assess left ventricle ejection fraction more importantly look at the size of the left atrium.  I will not initiate any anticoagulation at this stage since we do not have established diagnosis. 2. Dyspnea on exertion again echocardiogram will be done to assess left ventricle ejection fraction. 3. Late effect of CVA I will call, care physician to get more information about this.  Also will try to pull up her record from the hospital to see if she had an MRI of CT of her head done. 4. Type 2 diabetes this is a new diagnosis.  She understands she needs to lose some weight.  She is taking medication checking her sugar. 5. Obstructive sleep apnea: She is using CPAP mask on a regular basis. 6. Morbid obesity obviously a problem she is trying to work on that.   Medication Adjustments/Labs and Tests Ordered: Current medicines are reviewed at length with the patient today.  Concerns regarding medicines are outlined above.  No orders of the defined types were placed in this encounter.  No orders of the defined types were placed in this encounter.   Signed, 04/05/19, MD, [facility]. 09/18/2019 2:39 PM    [facility]

## 2019-09-18 NOTE — Patient Instructions (Signed)
Medication Instructions:  Your physician recommends that you continue on your current medications as directed. Please refer to the Current Medication list given to you today. *If you need a refill on your cardiac medications before your next appointment, please call your pharmacy*  Lab Work: None ordered. If you have labs (blood work) drawn today and your tests are completely normal, you will receive your results only by: Marland Kitchen MyChart Message (if you have MyChart) OR . A paper copy in the mail If you have any lab test that is abnormal or we need to change your treatment, we will call you to review the results.   Testing/Procedures: Your physician has requested that you have a carotid duplex. This test is an ultrasound of the carotid arteries in your neck. It looks at blood flow through these arteries that supply the brain with blood. Allow one hour for this exam. There are no restrictions or special instructions.  Please schedule for carotid bilateral ultrasound  Your physician has requested that you have an echocardiogram. Echocardiography is a painless test that uses sound waves to create images of your heart. It provides your doctor with information about the size and shape of your heart and how well your heart's chambers and valves are working. This procedure takes approximately one hour. There are no restrictions for this procedure.  Please schedule for ECHO  Follow-Up: At [facility], you and your health needs are our priority.  As part of our continuing mission to provide you with exceptional heart care, we have created designated Provider Care Teams.  These Care Teams include your primary Cardiologist (physician) and Advanced Practice Providers (APPs -  Physician Assistants and Nurse Practitioners) who all work together to provide you with the care you need, when you need it.  We recommend signing up for the patient portal called "MyChart".  Sign up information is provided on this  After Visit Summary.  MyChart is used to connect with patients for Virtual Visits (Telemedicine).  Patients are able to view lab/test results, encounter notes, upcoming appointments, etc.  Non-urgent messages can be sent to your provider as well.   To learn more about what you can do with MyChart, go to [email or web address].    Your next appointment:   1 month(s)  The format for your next appointment:   In Person  Provider:   Gypsy Balsam, MD  Other Instructions Veronica Leblanc- Long Term Monitor Instructions   Your physician has requested you wear your ZIO patch monitor__14__days.   This is a single patch monitor.  [employer] supplies one patch monitor per enrollment.  Additional stickers are not available.   Please do not apply patch if you will be having a Nuclear Stress Test, Echocardiogram, Cardiac CT, MRI, or Chest Xray during the time frame you would be wearing the monitor. The patch cannot be worn during these tests.  You cannot remove and re-apply the ZIO XT patch monitor.   Your ZIO patch monitor will be sent USPS Priority mail from [facility] directly to your home address. The monitor may also be mailed to a PO BOX if home delivery is not available.   It may take 3-5 days to receive your monitor after you have been enrolled.   Once you have received you monitor, please review enclosed instructions.  Your monitor has already been registered assigning a specific monitor serial # to you.   Applying the monitor   Shave hair from upper left chest.   Hold abrader disc by  orange tab.  Rub abrader in 40 strokes over left upper chest as indicated in your monitor instructions.   Clean area with 4 enclosed alcohol pads .  Use all pads to assure are is cleaned thoroughly.  Let dry.   Apply patch as indicated in monitor instructions.  Patch will be place under collarbone on left side of chest with arrow pointing upward.   Rub patch adhesive wings for 2 minutes.Remove white label  marked "1".  Remove white label marked "2".  Rub patch adhesive wings for 2 additional minutes.   While looking in a mirror, press and release button in center of patch.  A small green light will flash 3-4 times .  This will be your only indicator the monitor has been turned on.     Do not shower for the first 24 hours.  You may shower after the first 24 hours.   Press button if you feel a symptom. You will hear a small click.  Record Date, Time and Symptom in the Patient Log Book.   When you are ready to remove patch, follow instructions on last 2 pages of Patient Log Book.  Stick patch monitor onto last page of Patient Log Book.   Place Patient Log Book in [place] box.  Use locking tab on box and tape box closed securely.  The [employer] and [employer] has [employer] on it.  Please place in mailbox as soon as possible.  Your physician should have your test results approximately 7 days after the monitor has been mailed back to [facility].   Call [place] at [phone] if you have questions regarding your ZIO XT patch monitor.  Call them immediately if you see an orange light blinking on your monitor.   If your monitor falls off in less than 4 days contact our Monitor department at [phone].  If your monitor becomes loose or falls off after 4 days call [employer] at [phone] for suggestions on securing your monitor.

## 2019-09-25 DIAGNOSIS — G4733 Obstructive sleep apnea (adult) (pediatric): Secondary | ICD-10-CM | POA: Diagnosis not present

## 2019-09-26 ENCOUNTER — Ambulatory Visit (INDEPENDENT_AMBULATORY_CARE_PROVIDER_SITE_OTHER): Payer: Medicare Other

## 2019-09-26 ENCOUNTER — Other Ambulatory Visit: Payer: Self-pay

## 2019-09-26 DIAGNOSIS — R0600 Dyspnea, unspecified: Secondary | ICD-10-CM

## 2019-09-26 DIAGNOSIS — R0609 Other forms of dyspnea: Secondary | ICD-10-CM

## 2019-09-26 NOTE — Progress Notes (Signed)
Echocardiogram completed.

## 2019-09-27 ENCOUNTER — Telehealth: Payer: Self-pay

## 2019-09-27 NOTE — Telephone Encounter (Signed)
Spoke with patient regarding results.  Patient verbalizes understanding and is agreeable to plan of care. Advised patient to call back with any issues or concerns.  

## 2019-09-27 NOTE — Telephone Encounter (Signed)
-----   Message from Georgeanna Lea, MD sent at 09/27/2019 12:41 PM EDT ----- Echocardiogram showed normal left ventricular ejection fraction.  No significant valvular pathology

## 2019-10-09 DIAGNOSIS — G4733 Obstructive sleep apnea (adult) (pediatric): Secondary | ICD-10-CM | POA: Diagnosis not present

## 2019-10-13 DIAGNOSIS — J449 Chronic obstructive pulmonary disease, unspecified: Secondary | ICD-10-CM | POA: Diagnosis not present

## 2019-10-16 DIAGNOSIS — M79642 Pain in left hand: Secondary | ICD-10-CM | POA: Diagnosis not present

## 2019-10-16 DIAGNOSIS — M79641 Pain in right hand: Secondary | ICD-10-CM | POA: Diagnosis not present

## 2019-10-18 ENCOUNTER — Ambulatory Visit: Payer: Medicare Other | Admitting: Cardiology

## 2019-10-22 DIAGNOSIS — R002 Palpitations: Secondary | ICD-10-CM | POA: Diagnosis not present

## 2019-10-29 ENCOUNTER — Other Ambulatory Visit: Payer: Self-pay

## 2019-10-29 ENCOUNTER — Ambulatory Visit (INDEPENDENT_AMBULATORY_CARE_PROVIDER_SITE_OTHER): Payer: Medicare Other

## 2019-10-29 DIAGNOSIS — I6930 Unspecified sequelae of cerebral infarction: Secondary | ICD-10-CM

## 2019-10-29 DIAGNOSIS — R0609 Other forms of dyspnea: Secondary | ICD-10-CM

## 2019-10-29 NOTE — Progress Notes (Signed)
Complete carotid duplex exam has been performed.  Jimmy Jaran Sainz RDCS, RVT 

## 2019-10-31 ENCOUNTER — Other Ambulatory Visit: Payer: Self-pay

## 2019-11-01 ENCOUNTER — Encounter: Payer: Self-pay | Admitting: Cardiology

## 2019-11-01 ENCOUNTER — Telehealth: Payer: Self-pay

## 2019-11-01 ENCOUNTER — Other Ambulatory Visit: Payer: Self-pay

## 2019-11-01 ENCOUNTER — Ambulatory Visit (INDEPENDENT_AMBULATORY_CARE_PROVIDER_SITE_OTHER): Payer: Medicare Other | Admitting: Cardiology

## 2019-11-01 VITALS — BP 112/64 | HR 73 | Ht 64.0 in | Wt 278.0 lb

## 2019-11-01 DIAGNOSIS — R0600 Dyspnea, unspecified: Secondary | ICD-10-CM

## 2019-11-01 DIAGNOSIS — R002 Palpitations: Secondary | ICD-10-CM

## 2019-11-01 DIAGNOSIS — I6930 Unspecified sequelae of cerebral infarction: Secondary | ICD-10-CM

## 2019-11-01 DIAGNOSIS — R0609 Other forms of dyspnea: Secondary | ICD-10-CM

## 2019-11-01 NOTE — Patient Instructions (Signed)

## 2019-11-01 NOTE — Telephone Encounter (Signed)
-----   Message from Georgeanna Lea, MD sent at 11/01/2019  1:11 PM EDT ----- Monitor showing frequent PVCs, continue present management

## 2019-11-01 NOTE — Progress Notes (Signed)
Cardiology Office Note:    Date:  11/01/2019   ID:  [name], DOB 1961/02/26, MRN [id number]  PCP:  Janeice Robinson, PA-C  Cardiologist:  Jenne Campus, MD    Referring MD: Janeice Robinson,*   Chief Complaint  Patient presents with  . Follow-up    1 MO FU  Doing fine  History of Present Illness:    Veronica Leblanc is a 59 y.o. female who was referred to [place] because of palpitations.  Started several more disturbing when she was 59 years old she did suffer from stroke of course suspicion was that she may have episodes of atrial fibrillation.  Since that time I seen her last time she is doing well she described to have some skipped beats but no sustained arrhythmia.  She does describe to have some fatigue tiredness but no chest pain tightness squeezing pressure burning chest.  Past Medical History:  Diagnosis Date  . Acute, but ill-defined, cerebrovascular disease   . Arthritis   . Asthma   . CVA (cerebral vascular accident) ([place])   . CVA (cerebral vascular accident) ([place])    Age 76  . Endometriosis, site unspecified   . History of migraine headaches   . OSA (obstructive sleep apnea)     Past Surgical History:  Procedure Laterality Date  . APPENDECTOMY    . CESAREAN SECTION    . CHOLECYSTECTOMY    . ganglionectomy     c2/c3 right and left side  . NECK SURGERY     5 times  . presacral neurectomy    . SALPINGOOPHORECTOMY     left  . TOTAL ABDOMINAL HYSTERECTOMY      Current Medications: Current Meds  Medication Sig  . albuterol (PROVENTIL HFA) 108 (90 BASE) MCG/ACT inhaler Inhale 2 puffs into the lungs every 6 (six) hours as needed.    . budesonide-formoterol (SYMBICORT) 160-4.5 MCG/ACT inhaler Inhale 2 puffs into the lungs 2 (two) times daily.  . cetirizine (ZYRTEC) 10 MG tablet Take 10 mg by mouth daily.    . citalopram (CELEXA) 40 MG tablet Take 40 mg by mouth daily. Takes 1 tablet at bedtime.  . furosemide (LASIX) 20 MG tablet Take  20 mg by mouth daily. Takes 1/2 tablet dailey as needed for edema.  . hydrOXYzine (VISTARIL) 25 MG capsule TAKE 1 TO 2 CAPSULES EVERY 6 HOURS AS NEEDED FOR ANXIETY  . ibuprofen (IBU) 800 MG tablet Take 800 mg by mouth every 8 (eight) hours as needed.    Marland Kitchen JARDIANCE 10 MG TABS tablet Take 10 mg by mouth daily.  Marland Kitchen lisinopril (ZESTRIL) 40 MG tablet Take 40 mg by mouth daily.  . Melatonin 1 MG TABS Take 9 mg by mouth at bedtime.  . metoprolol succinate (TOPROL-XL) 25 MG 24 hr tablet Take 25 mg by mouth daily.  . metoprolol tartrate (LOPRESSOR) 25 MG tablet Take 25 mg by mouth daily.  . montelukast (SINGULAIR) 10 MG tablet Take 10 mg by mouth at bedtime.  . nabumetone (RELAFEN) 750 MG tablet Take by mouth.  . rosuvastatin (CRESTOR) 20 MG tablet Take 20 mg by mouth at bedtime.  . temazepam (RESTORIL) 15 MG capsule Take 1 capsule (15 mg total) by mouth at bedtime as needed for sleep.     Allergies:   Butorphanol, Metoclopramide, Other, Amitriptyline hcl, Cefazolin, Ceftriaxone sodium, Ciprofloxacin, Darvocet [propoxyphene n-acetaminophen], Penicillins, Prochlorperazine edisylate, Promethazine hcl, Sulfamethoxazole, Sulfonamide derivatives, and Sumatriptan   Social History   Socioeconomic History  . Marital status: Divorced  Spouse name: Not on file  . Number of children: Not on file  . Years of education: Not on file  . Highest education level: Not on file  Occupational History  . 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 Smoking status: Never Smoker  . Smokeless tobacco: Never Used  Substance and Sexual Activity  . Alcohol use: Not on file  . Drug use: Not on file  . Sexual activity: Not on file  Other Topics Concern  . Not on file  Social History Narrative  . Not on file   Social Determinants of Health   Financial Resource Strain:   . Difficulty of Paying Living Expenses:   Food Insecurity:   . Worried About Programme researcher, broadcasting/film/video in the Last Year:   . Occupational psychologist in the Last Year:   Transportation Needs:   . Freight forwarder (Medical):   Marland Kitchen Lack of Transportation (Non-Medical):   Physical Activity:   . Days of Exercise per Week:   . Minutes of Exercise per Session:   Stress:   . Feeling of Stress :   Social Connections:   . Frequency of Communication with Friends and Family:   . Frequency of Social Gatherings with Friends and Family:   . Attends Religious Services:   . Active Member of Clubs or Organizations:   . Attends Banker Meetings:   Marland Kitchen Marital Status:      Family History: The patient's family history includes Breast cancer in her maternal aunt; Colon cancer in her cousin; Diabetes in her brother and father; Heart disease in her maternal grandfather and paternal grandfather; Hypertension in her brother, father, and mother; Pancreatic cancer in her maternal grandmother. ROS:   Please see the history of present illness.    All 14 point review of systems negative except as described per history of present illness  EKGs/Labs/Other Studies Reviewed:      Recent Labs: 08/30/2019: Hemoglobin 13.2; Platelets 270.0  Recent Lipid Panel No results found for: CHOL, TRIG, HDL, CHOLHDL, VLDL, LDLCALC, LDLDIRECT  Physical Exam:    VS:  BP 112/64   Pulse 73   Ht 5\' 4"  (1.626 m)   Wt 278 lb (126.1 kg)   LMP  (LMP Unknown)   SpO2 97%   BMI 47.72 kg/m     Wt Readings from Last 3 Encounters:  11/01/19 278 lb (126.1 kg)  09/18/19 273 lb 6.4 oz (124 kg)  08/30/19 284 lb (128.8 kg)     GEN:  Well nourished, well developed in no acute distress HEENT: Normal NECK: No JVD; No carotid bruits LYMPHATICS: No lymphadenopathy CARDIAC: RRR, no murmurs, no rubs, no gallops RESPIRATORY:  Clear to auscultation without rales, wheezing or rhonchi  ABDOMEN: Soft, non-tender, non-distended MUSCULOSKELETAL:  No edema; No deformity  SKIN: Warm and dry LOWER EXTREMITIES: no swelling NEUROLOGIC:  Alert and oriented x  3 PSYCHIATRIC:  Normal affect   ASSESSMENT:    1. Palpitations   2. Late effect of cerebrovascular accident (CVA)   3. Dyspnea on exertion    PLAN:    In order of problems listed above:  1. Palpitations: She did wear monitor which shows only some extrasystole, there were no sustained arrhythmia.  There is 1 triggered event showing normal sinus rhythm.  We did not see any atrial fibrillation. 2. Late effect of CVA, again she got CVA when she was only 59 years old suspicion was that  she may have had episode of atrial fibrillation, however monitor did not show any of this arrhythmia.  On top of that I did echocardiogram to look at the left ventricle ejection fraction which is normal as well as atrial size which is normal.  Is hard for me to imagine that somebody having atrial fibrillation paroxysmal for last 20 years without significant enlargement of atria's, therefore I do not think she does have atrial fibrillation.  We did talk about different measures that she can take to catch arrhythmia that she is experiencing we will talking about apple watch and I encouraged her to consider buying the device so she can record her arrhythmia if she does have it. 3. Dyspnea on exertion her echocardiogram showed preserved left ventricle ejection fraction but she does have diastolic dysfunction, however blood pressure seems to be well managed right now.  We will continue present management. 4. Dyslipidemia: She is scheduled to have her fasting lipid profile done in June.  I did review K PN which I do see her cholesterol from June of last year which was normal.   Medication Adjustments/Labs and Tests Ordered: Current medicines are reviewed at length with the patient today.  Concerns regarding medicines are outlined above.  No orders of the defined types were placed in this encounter.  Medication changes: No orders of the defined types were placed in this encounter.   Signed, Georgeanna Lea, MD,  [facility] 11/01/2019 11:12 AM    [facility]

## 2019-11-01 NOTE — Telephone Encounter (Signed)
Left message on patients voicemail to please return our call.   

## 2019-11-07 DIAGNOSIS — G5603 Carpal tunnel syndrome, bilateral upper limbs: Secondary | ICD-10-CM | POA: Diagnosis not present

## 2019-11-08 DIAGNOSIS — G4733 Obstructive sleep apnea (adult) (pediatric): Secondary | ICD-10-CM | POA: Diagnosis not present

## 2019-11-13 DIAGNOSIS — G5603 Carpal tunnel syndrome, bilateral upper limbs: Secondary | ICD-10-CM | POA: Diagnosis not present

## 2019-11-13 DIAGNOSIS — J449 Chronic obstructive pulmonary disease, unspecified: Secondary | ICD-10-CM | POA: Diagnosis not present

## 2019-11-21 DIAGNOSIS — G5601 Carpal tunnel syndrome, right upper limb: Secondary | ICD-10-CM | POA: Diagnosis not present

## 2019-11-28 DIAGNOSIS — I152 Hypertension secondary to endocrine disorders: Secondary | ICD-10-CM | POA: Diagnosis not present

## 2019-11-28 DIAGNOSIS — E1159 Type 2 diabetes mellitus with other circulatory complications: Secondary | ICD-10-CM | POA: Diagnosis not present

## 2019-12-09 DIAGNOSIS — G4733 Obstructive sleep apnea (adult) (pediatric): Secondary | ICD-10-CM | POA: Diagnosis not present

## 2019-12-12 DIAGNOSIS — G4733 Obstructive sleep apnea (adult) (pediatric): Secondary | ICD-10-CM | POA: Diagnosis not present

## 2019-12-13 DIAGNOSIS — J449 Chronic obstructive pulmonary disease, unspecified: Secondary | ICD-10-CM | POA: Diagnosis not present

## 2019-12-14 DIAGNOSIS — G5602 Carpal tunnel syndrome, left upper limb: Secondary | ICD-10-CM | POA: Diagnosis not present

## 2019-12-20 DIAGNOSIS — I152 Hypertension secondary to endocrine disorders: Secondary | ICD-10-CM | POA: Diagnosis not present

## 2019-12-20 DIAGNOSIS — E1159 Type 2 diabetes mellitus with other circulatory complications: Secondary | ICD-10-CM | POA: Diagnosis not present

## 2019-12-20 DIAGNOSIS — Z9889 Other specified postprocedural states: Secondary | ICD-10-CM | POA: Diagnosis not present

## 2019-12-25 ENCOUNTER — Other Ambulatory Visit: Payer: Self-pay | Admitting: Internal Medicine

## 2019-12-25 NOTE — Telephone Encounter (Signed)
Pt is requestng refill on temazepam 15 mg has f/u on 12/31/19

## 2019-12-25 NOTE — Telephone Encounter (Signed)
Temazepam refill e-sent 

## 2019-12-31 ENCOUNTER — Ambulatory Visit: Payer: Medicare Other | Admitting: Internal Medicine

## 2019-12-31 NOTE — Progress Notes (Deleted)
HPI F never smoker followed for OSA, Insomnia, complicated by CVA, OSA, Endometriosis, Asthma, Allergic Rhinitis, HBP,  PSG from 05/06/10>>AHI 9.6, SpO2 low 77% from [place].   CPAP titration 07/16/10>>CPAP 8 cm from [place]. ---------------------------------------------------------  08/30/19- 59 yoF never smoker followed for OSA, Insomnia, complicated by CVA (age 59), OSA, Endometriosis, Asthma, Allergic Rhinitis, HBP,  CPAP  Auto 5-15/ Home O2 sleep/ [employer] Download compliance 93%, AHI 0.4/ hr Body weight today 284 lbs Singulair, melatonin, Vistaril, Zyrtec, Symbicort 160, albuterol hfa Doing well with CPAP. Still some difficulty with initiating and maintaining sleep. Melatonin 9 mg insufficient.  Asks we manage asthma- Hx since childhood. Seasonal and perennial rhinitis correlates. Not aware reflux. No problem with aspirin. Triggers also include strong smells and smoke.  Neighbor in her "nonsmoking" apartment smokes, and odor comes through.  Worse in past 6 weeks with frequent cough, white/ yellow, no fever. Reports CXR clear at [facility]. Using rescue 0-4x daily. Allergy shots remote.    12/31/19- 14 yoF never smoker followed for OSA, Insomnia, Asthma, complicated by CVA (age 34), OSA, Endometriosis,  Allergic Rhinitis, HBP,  CPAP  Auto 5-15/ Home O2 sleep/ [employer]    ROS-see HPI   + = positive Constitutional:    weight loss, night sweats, fevers, chills, +fatigue, lassitude. HEENT:    +headaches, difficulty swallowing, tooth/dental problems, sore throat,       +sneezing, itching, ear ache, +nasal congestion, post nasal drip, snoring CV:    chest pain, orthopnea, PND, +swelling in lower extremities, anasarca,                                  dizziness, palpitations Resp:   shortness of breath with exertion or at rest.                +productive cough,   +non-productive cough, coughing up of blood.              change in color of mucus.  +wheezing.   Skin:    rash or lesions. GI:   No-   heartburn, indigestion, abdominal pain, nausea, vomiting, diarrhea,                 change in bowel habits, loss of appetite GU: dysuria, change in color of urine, no urgency or frequency.   flank pain. MS:   joint pain, stiffness, decreased range of motion, back pain. Neuro-     nothing unusual Psych:  change in mood or affect.  depression or anxiety.   memory loss.  OBJ- Physical Exam General- Alert, Oriented, Affect-appropriate, Distress- none acute, + obese Skin- rash-none, lesions- none, excoriation- none Lymphadenopathy- none Head- atraumatic            Eyes- Gross vision intact, PERRLA, conjunctivae and secretions clear            Ears- Hearing, canals-normal            Nose- Clear, no-Septal dev, mucus, polyps, erosion, perforation             Throat- Mallampati III , mucosa clear , drainage- none, tonsils- atrophic,  + many missing teeth Neck- flexible , trachea midline, no stridor , thyroid nl, carotid no bruit Chest - symmetrical excursion , unlabored           Heart/CV- RRR , no murmur , no gallop  , no rub, nl s1 s2                           -  JVD- none , edema- none, stasis changes- none, varices- none           Lung- clear to P&A, wheeze- none, cough- none , dullness-none, rub- none           Chest wall-  Abd-  Br/ Gen/ Rectal- Not done, not indicated Extrem- cyanosis- none, clubbing, none, atrophy- none, strength- nl Neuro- grossly intact to observation

## 2020-01-07 DIAGNOSIS — E1159 Type 2 diabetes mellitus with other circulatory complications: Secondary | ICD-10-CM | POA: Diagnosis not present

## 2020-01-07 DIAGNOSIS — E785 Hyperlipidemia, unspecified: Secondary | ICD-10-CM | POA: Diagnosis not present

## 2020-01-08 DIAGNOSIS — B372 Candidiasis of skin and nail: Secondary | ICD-10-CM | POA: Diagnosis not present

## 2020-01-08 DIAGNOSIS — I152 Hypertension secondary to endocrine disorders: Secondary | ICD-10-CM | POA: Diagnosis not present

## 2020-01-08 DIAGNOSIS — G4733 Obstructive sleep apnea (adult) (pediatric): Secondary | ICD-10-CM | POA: Diagnosis not present

## 2020-01-08 DIAGNOSIS — E1159 Type 2 diabetes mellitus with other circulatory complications: Secondary | ICD-10-CM | POA: Diagnosis not present

## 2020-01-13 DIAGNOSIS — J449 Chronic obstructive pulmonary disease, unspecified: Secondary | ICD-10-CM | POA: Diagnosis not present

## 2020-01-31 DIAGNOSIS — E785 Hyperlipidemia, unspecified: Secondary | ICD-10-CM | POA: Diagnosis not present

## 2020-01-31 DIAGNOSIS — E876 Hypokalemia: Secondary | ICD-10-CM | POA: Diagnosis not present

## 2020-01-31 DIAGNOSIS — I152 Hypertension secondary to endocrine disorders: Secondary | ICD-10-CM | POA: Diagnosis not present

## 2020-01-31 DIAGNOSIS — E1159 Type 2 diabetes mellitus with other circulatory complications: Secondary | ICD-10-CM | POA: Diagnosis not present

## 2020-01-31 DIAGNOSIS — E1169 Type 2 diabetes mellitus with other specified complication: Secondary | ICD-10-CM | POA: Diagnosis not present

## 2020-02-08 DIAGNOSIS — G4733 Obstructive sleep apnea (adult) (pediatric): Secondary | ICD-10-CM | POA: Diagnosis not present

## 2020-02-13 DIAGNOSIS — J449 Chronic obstructive pulmonary disease, unspecified: Secondary | ICD-10-CM | POA: Diagnosis not present

## 2020-03-04 ENCOUNTER — Other Ambulatory Visit: Payer: Self-pay

## 2020-03-04 ENCOUNTER — Encounter: Payer: Self-pay | Admitting: Cardiology

## 2020-03-04 ENCOUNTER — Ambulatory Visit (INDEPENDENT_AMBULATORY_CARE_PROVIDER_SITE_OTHER): Payer: Medicare Other | Admitting: Cardiology

## 2020-03-04 VITALS — BP 100/68 | HR 84 | Ht 64.0 in | Wt 266.0 lb

## 2020-03-04 DIAGNOSIS — G4733 Obstructive sleep apnea (adult) (pediatric): Secondary | ICD-10-CM

## 2020-03-04 DIAGNOSIS — R002 Palpitations: Secondary | ICD-10-CM

## 2020-03-04 DIAGNOSIS — R0600 Dyspnea, unspecified: Secondary | ICD-10-CM | POA: Diagnosis not present

## 2020-03-04 DIAGNOSIS — I6930 Unspecified sequelae of cerebral infarction: Secondary | ICD-10-CM | POA: Diagnosis not present

## 2020-03-04 DIAGNOSIS — R0609 Other forms of dyspnea: Secondary | ICD-10-CM

## 2020-03-04 NOTE — Patient Instructions (Signed)

## 2020-03-04 NOTE — Progress Notes (Signed)
Cardiology Office Note:    Date:  03/04/2020   ID:  Veronica Leblanc, DOB 1961-01-08, MRN [id number]  PCP:  Jerene Canny, PA-C  Cardiologist:  Gypsy Balsam, MD    Referring MD: Jerene Canny,*   Chief Complaint  Patient presents with  . Follow-up  Doing fine except I do have some palpitations when I get upset  History of Present Illness:    Veronica Leblanc is a 59 y.o. female with past medical history significant for CVA which was more than 20 years ago, it was ischemic episode, dyspnea on exertion, dyslipidemia, diabetes.  Comes today to my office for follow-up.  Overall she seems to be doing well.  Denies have any chest pain tightness squeezing pressure burning chest.  She describes episode of palpitations and this palpitation happening when she gets upset.  She is trying to be a little more active especially now when the weather is a little bit cooler.  Past Medical History:  Diagnosis Date  . Acute, but ill-defined, cerebrovascular disease   . Arthritis   . Asthma   . CVA (cerebral vascular accident) (HCC)   . CVA (cerebral vascular accident) (HCC)    Age 89  . Endometriosis, site unspecified   . History of migraine headaches   . OSA (obstructive sleep apnea)     Past Surgical History:  Procedure Laterality Date  . APPENDECTOMY    . CESAREAN SECTION    . CHOLECYSTECTOMY    . ganglionectomy     c2/c3 right and left side  . NECK SURGERY     5 times  . presacral neurectomy    . SALPINGOOPHORECTOMY     left  . TOTAL ABDOMINAL HYSTERECTOMY      Current Medications: Current Meds  Medication Sig  . albuterol (PROVENTIL HFA) 108 (90 BASE) MCG/ACT inhaler Inhale 2 puffs into the lungs every 6 (six) hours as needed.    . budesonide-formoterol (SYMBICORT) 160-4.5 MCG/ACT inhaler Inhale 2 puffs into the lungs 2 (two) times daily.  . cetirizine (ZYRTEC) 10 MG tablet Take 10 mg by mouth daily.    . citalopram (CELEXA) 40 MG tablet Take 40 mg by  mouth daily. Takes 1 tablet at bedtime.  . furosemide (LASIX) 20 MG tablet Take 20 mg by mouth daily. Takes 1/2 tablet dailey as needed for edema.  . hydrOXYzine (VISTARIL) 25 MG capsule TAKE 1 TO 2 CAPSULES EVERY 6 HOURS AS NEEDED FOR ANXIETY  . ibuprofen (IBU) 800 MG tablet Take 800 mg by mouth every 8 (eight) hours as needed.    Marland Kitchen JARDIANCE 10 MG TABS tablet Take 10 mg by mouth daily.  Marland Kitchen lisinopril (ZESTRIL) 40 MG tablet Take 40 mg by mouth daily.  . Melatonin 1 MG TABS Take 9 mg by mouth at bedtime.  . metoprolol succinate (TOPROL-XL) 25 MG 24 hr tablet Take 25 mg by mouth daily.  . montelukast (SINGULAIR) 10 MG tablet Take 10 mg by mouth at bedtime.  Marland Kitchen nystatin (MYCOSTATIN/NYSTOP) powder Apply topically 2 (two) times daily.  . rosuvastatin (CRESTOR) 20 MG tablet Take 20 mg by mouth at bedtime.  . temazepam (RESTORIL) 15 MG capsule Take 1 capsule (15 mg total) by mouth at bedtime as needed for sleep.     Allergies:   Butorphanol, Metoclopramide, Other, Amitriptyline hcl, Cefazolin, Ceftriaxone sodium, Ciprofloxacin, Darvocet [propoxyphene n-acetaminophen], Penicillins, Prochlorperazine edisylate, Promethazine hcl, Sulfamethoxazole, Sulfonamide derivatives, and Sumatriptan   Social History   Socioeconomic History  . Marital status: Divorced  Spouse name: Not on file  . Number of children: Not on file  . Years of education: Not on file  . Highest education level: Not on file  Occupational History  . 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 Smoking status: Never Smoker  . Smokeless tobacco: Never Used  Substance and Sexual Activity  . Alcohol use: Not on file  . Drug use: Not on file  . Sexual activity: Not on file  Other Topics Concern  . Not on file  Social History Narrative  . Not on file   Social Determinants of Health   Financial Resource Strain:   . Difficulty of Paying Living Expenses: Not on file  Food Insecurity:   . Worried About Community education officer in the Last Year: Not on file  . Ran Out of Food in the Last Year: Not on file  Transportation Needs:   . Lack of Transportation (Medical): Not on file  . Lack of Transportation (Non-Medical): Not on file  Physical Activity:   . Days of Exercise per Week: Not on file  . Minutes of Exercise per Session: Not on file  Stress:   . Feeling of Stress : Not on file  Social Connections:   . Frequency of Communication with Friends and Family: Not on file  . Frequency of Social Gatherings with Friends and Family: Not on file  . Attends Religious Services: Not on file  . Active Member of Clubs or Organizations: Not on file  . Attends Banker Meetings: Not on file  . Marital Status: Not on file     Family History: The patient's family history includes Breast cancer in her maternal aunt; Colon cancer in her cousin; Diabetes in her brother and father; Heart disease in her maternal grandfather and paternal grandfather; Hypertension in her brother, father, and mother; Pancreatic cancer in her maternal grandmother. ROS:   Please see the history of present illness.    All 14 point review of systems negative except as described per history of present illness  EKGs/Labs/Other Studies Reviewed:      Recent Labs: 08/30/2019: Hemoglobin 13.2; Platelets 270.0  Recent Lipid Panel No results found for: CHOL, TRIG, HDL, CHOLHDL, VLDL, LDLCALC, LDLDIRECT  Physical Exam:    VS:  BP 100/68 (BP Location: Right Arm, Patient Position: Sitting, Cuff Size: Large)   Pulse 84   Ht 5\' 4"  (1.626 m)   Wt 266 lb (120.7 kg)   LMP  (LMP Unknown)   SpO2 96%   BMI 45.66 kg/m     Wt Readings from Last 3 Encounters:  03/04/20 266 lb (120.7 kg)  11/01/19 278 lb (126.1 kg)  09/18/19 273 lb 6.4 oz (124 kg)     GEN:  Well nourished, well developed in no acute distress HEENT: Normal NECK: No JVD; No carotid bruits LYMPHATICS: No lymphadenopathy CARDIAC: RRR, no murmurs, no rubs, no  gallops RESPIRATORY:  Clear to auscultation without rales, wheezing or rhonchi  ABDOMEN: Soft, non-tender, non-distended MUSCULOSKELETAL:  No edema; No deformity  SKIN: Warm and dry LOWER EXTREMITIES: no swelling NEUROLOGIC:  Alert and oriented x 3 PSYCHIATRIC:  Normal affect   ASSESSMENT:    1. Palpitations   2. Late effect of cerebrovascular accident (CVA)   3. Dyspnea on exertion   4. Obstructive sleep apnea    PLAN:    In order of problems listed above:  1. Palpitations: Describe dose only when she gets upset.  I told her if this will be still happening we may need to put another monitor however for now I would just continue conservative approach. 2. Late effect of CVA.  That happened more than 20 years ago.  She recovered quite nicely denies have being any new issues. 3. Dyspnea exertion I suspect is multifactorial, her echocardiogram showed preserved left ventricle ejection fraction. 4. Obstructive sleep apnea that being managed by antimedicine team 5. Diabetic with history of CVA I think she can benefit from aspirin ask her to start taking 1 baby aspirin every single day   Medication Adjustments/Labs and Tests Ordered: Current medicines are reviewed at length with the patient today.  Concerns regarding medicines are outlined above.  No orders of the defined types were placed in this encounter.  Medication changes: No orders of the defined types were placed in this encounter.   Signed, Georgeanna Lea, MD, [facility] 03/04/2020 2:02 PM    [facility]

## 2020-03-06 IMAGING — MR MR KNEE*L* W/O CM
6 series · 40 of 40 positions shown · non-contrast
Comparison: None.

CLINICAL DATA: Six-month history of generalized knee pain.

EXAM:
MRI OF THE LEFT KNEE WITHOUT CONTRAST
TECHNIQUE: Multiplanar, multisequence MR imaging of the knee was performed. No
intravenous contrast was administered.

[Series 3: T2 fat-sat · axial · left · 4.0mm · 0.50mm/px · z∈[-34,+101]mm · 9 of 32 slices shown (1 of 3)]
[im 1/32]
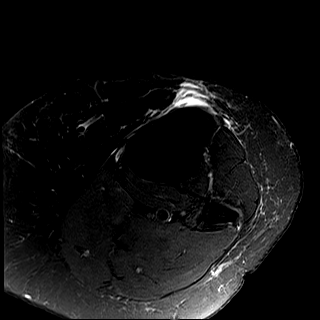
[im 4/32]
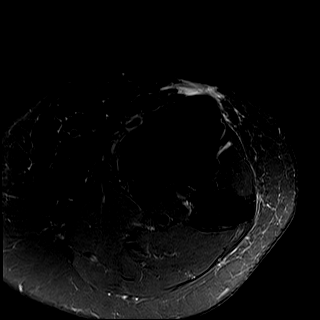
[im 8/32]
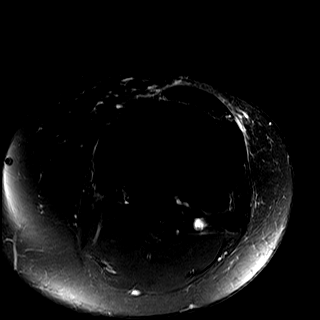
[im 12/32]
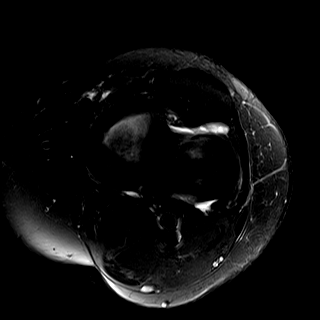
[im 16/32]
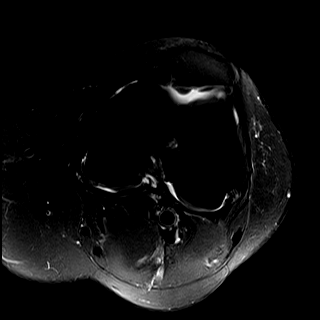
[im 20/32]
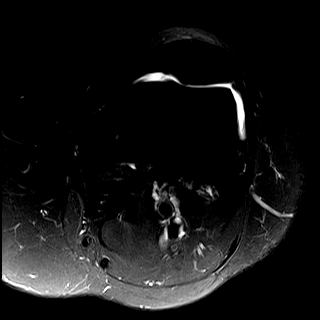
[im 24/32]
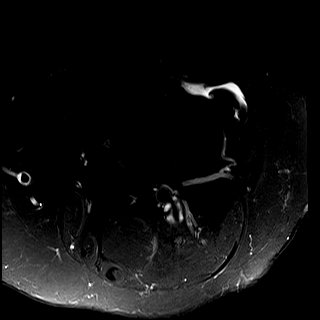
[im 28/32]
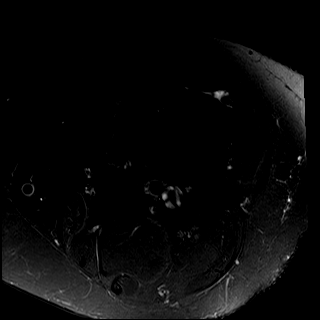
[im 32/32]
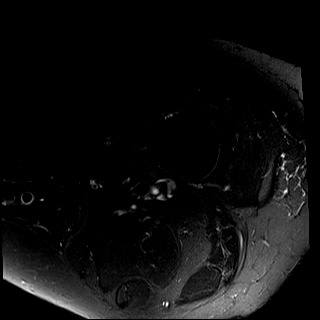

[Series 4: T2 fat-sat · coronal · left · 4.0mm · 0.50mm/px · 7 of 26 slices shown (2 of 3)]
[im 1/26]
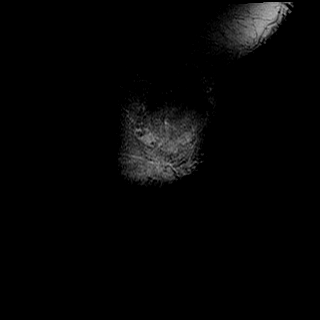
[im 5/26]
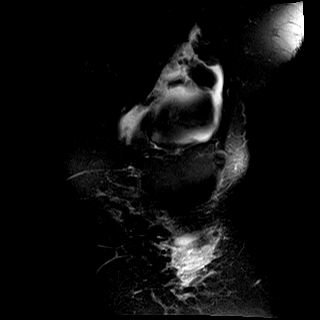
[im 9/26]
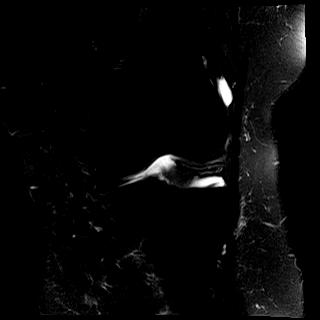
[im 13/26]
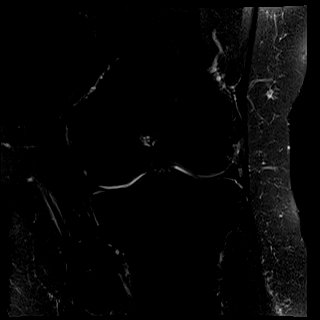
[im 17/26]
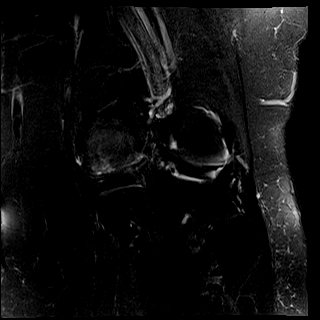
[im 21/26]
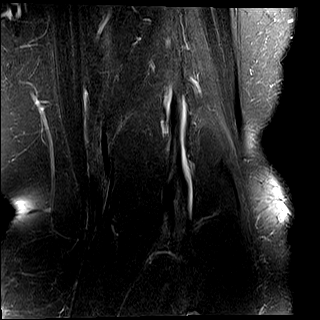
[im 26/26]
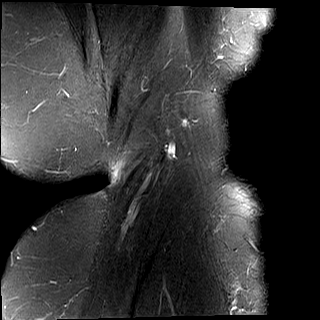

[Series 5: T1 · coronal · left · 4.0mm · 0.50mm/px · 6 of 26 slices shown]
[im 1/26]
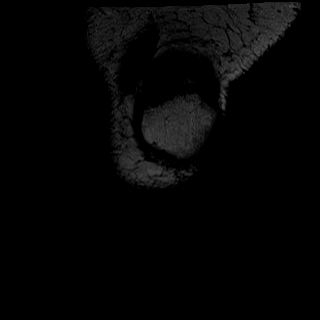
[im 6/26]
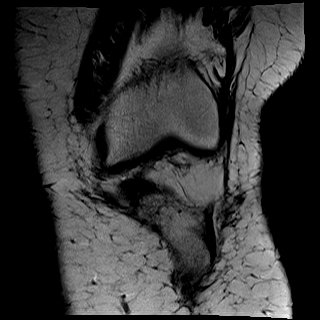
[im 11/26]
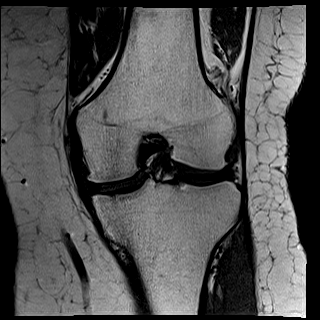
[im 16/26]
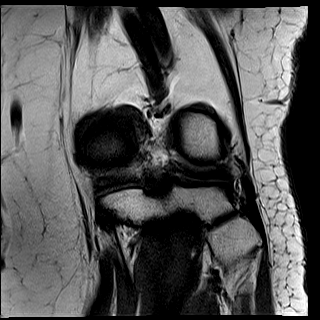
[im 21/26]
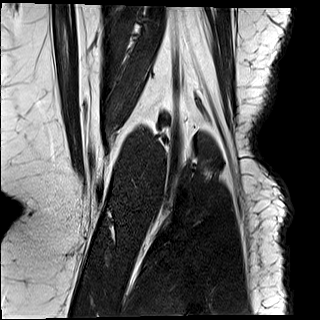
[im 26/26]
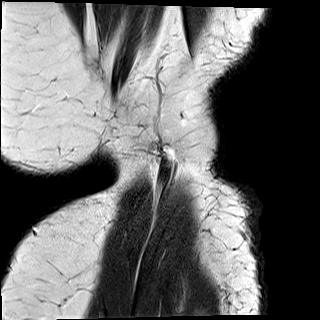

[Series 7: PD fat-sat · sagittal · left · 3.3mm · 0.50mm/px · 6 of 25 slices shown (1 of 2)]
[im 1/25]
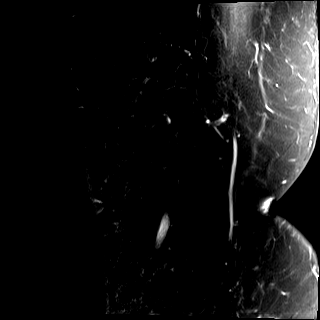
[im 5/25]
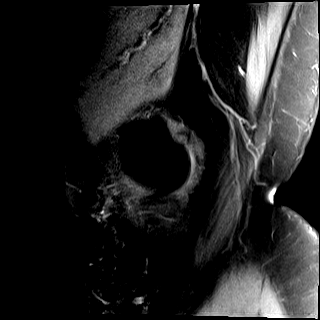
[im 10/25]
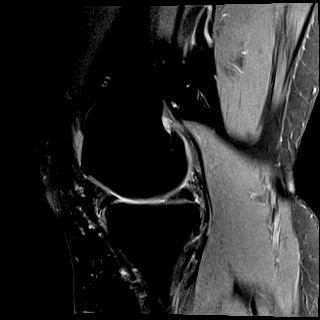
[im 15/25]
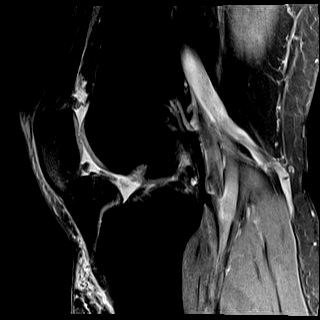
[im 20/25]
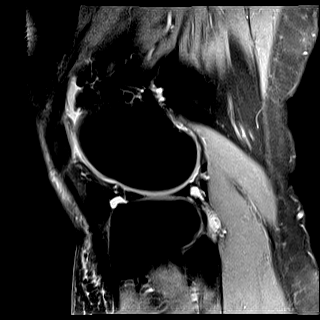
[im 25/25]
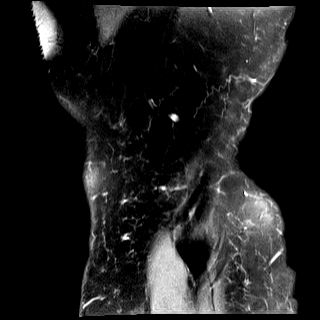

[Series 8: PD fat-sat · coronal · left · 4.0mm · 0.62mm/px · 6 of 26 slices shown (2 of 2)]
[im 1/26]
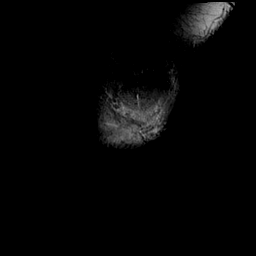
[im 6/26]
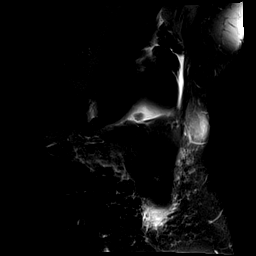
[im 11/26]
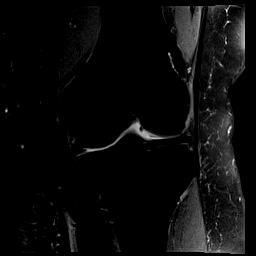
[im 16/26]
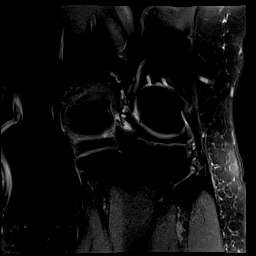
[im 21/26]
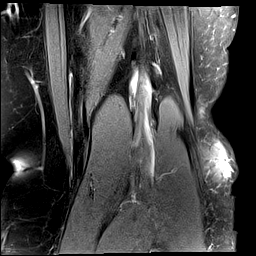
[im 26/26]
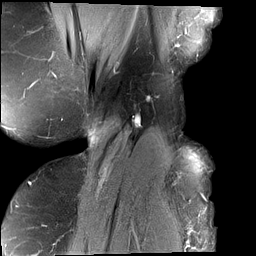

[Series 9: T2 fat-sat · sagittal · left · 3.3mm · 0.50mm/px · 6 of 25 slices shown (3 of 3)]
[im 1/25]
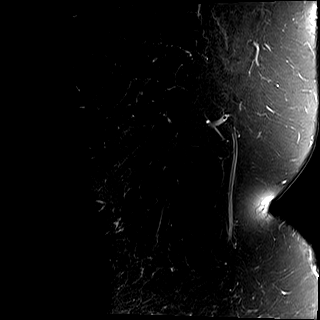
[im 5/25]
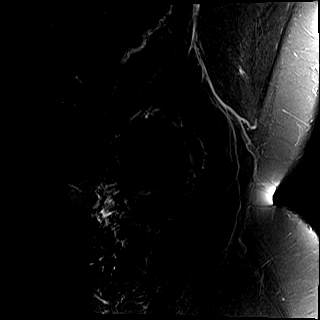
[im 10/25]
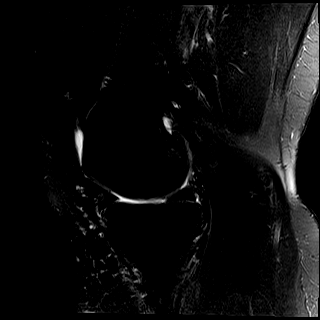
[im 15/25]
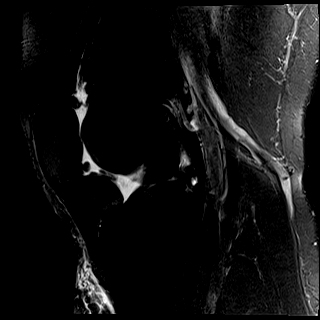
[im 20/25]
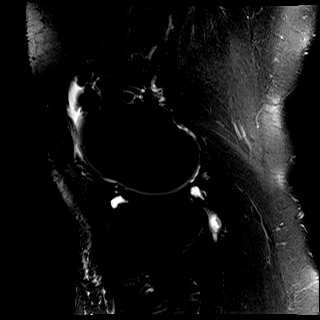
[im 25/25]
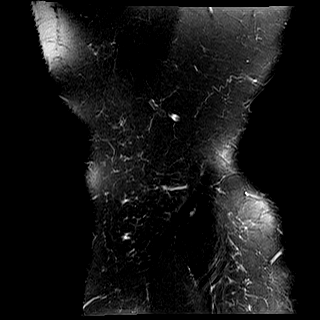

[40 of 40 positions shown; findings below may reference images not displayed]

FINDINGS: MENISCI

Medial meniscus: Small free edge and inferior articular surface
tears involving the posterior horn and mild intrasubstance
degenerative type signal changes.

Lateral meniscus:  Intact

LIGAMENTS

Cruciates:  Intact

Collaterals:  Intact

CARTILAGE

Patellofemoral:  Mild degenerative chondrosis.

Medial: Mild/moderate degenerative chondrosis with early spurring
changes.

Lateral:  Mild degenerative chondrosis with early spurring changes.

Joint:  Small joint effusion and mild synovitis.

Popliteal Fossa:  No popliteal mass or Baker's cyst.

Extensor Mechanism: The patella retinacular structures are intact
and the quadriceps and patellar tendons are intact. Mild proximal
and distal patellar tendinopathy.

Bones: No acute bony findings. No bone contusion, marrow edema or
osteochondral lesion.

Other: Normal knee musculature.
IMPRESSION: 1. Small free edge and inferior articular surface tears involving
the medial meniscus.
2. Intact ligamentous structures and no acute bony findings.
3. Mild tricompartmental degenerative chondrosis.
4. Small joint effusion and mild synovitis.
5. Mild proximal and distal patellar tendinopathy.

## 2020-03-10 DIAGNOSIS — G4733 Obstructive sleep apnea (adult) (pediatric): Secondary | ICD-10-CM | POA: Diagnosis not present

## 2020-03-12 DIAGNOSIS — G4733 Obstructive sleep apnea (adult) (pediatric): Secondary | ICD-10-CM | POA: Diagnosis not present

## 2020-03-14 DIAGNOSIS — J449 Chronic obstructive pulmonary disease, unspecified: Secondary | ICD-10-CM | POA: Diagnosis not present

## 2020-04-07 DIAGNOSIS — T25122A Burn of first degree of left foot, initial encounter: Secondary | ICD-10-CM | POA: Diagnosis not present

## 2020-04-07 DIAGNOSIS — E119 Type 2 diabetes mellitus without complications: Secondary | ICD-10-CM | POA: Diagnosis not present

## 2020-04-09 DIAGNOSIS — G4733 Obstructive sleep apnea (adult) (pediatric): Secondary | ICD-10-CM | POA: Diagnosis not present

## 2020-04-14 DIAGNOSIS — J449 Chronic obstructive pulmonary disease, unspecified: Secondary | ICD-10-CM | POA: Diagnosis not present

## 2020-04-16 NOTE — Progress Notes (Signed)
HPI Veronica Leblanc never smoker followed for OSA, Insomnia, complicated by CVA, OSA, Endometriosis, Asthma, Allergic Rhinitis, HBP,  NPSG [facility] 05/05/10- AHI 9.6/ hr, RDI 13.2/ hr, desaturation to 77%.  ----------------------------------------------------------------------------------------------------------------------------------------- 08/30/19- 59 yoF never smoker followed for OSA, Insomnia, complicated by CVA, OSA, Endometriosis, Asthma, Allergic Rhinitis, HBP,  CPAP  Auto 5-15/ Home O2 sleep/ [employer] Download compliance 93%, AHI 0.4/ hr Body weight today 284 lbs Singulair, melatonin, Vistaril, Zyrtec, Symbicort 160, albuterol hfa Doing well with CPAP. Still some difficulty with initiating and maintaining sleep. Melatonin 9 mg insufficient.  Asks we manage asthma- Hx since childhood. Seasonal and perennial rhinitis correlates. Not aware reflux. No problem with aspirin. Triggers also include strong smells and smoke.  Neighbor in her "nonsmoking" apartment smokes, and odor comes through.  Worse in past 6 weeks with frequent cough, white/ yellow, no fever. Reports CXR clear at [facility]. Using rescue 0-4x daily. Allergy shots remote.    04/17/20- 59 yoF never smoker followed for OSA, Insomnia, Asthma, , complicated by CVA, OSA, Endometriosis,  Allergic Rhinitis, HBP, DM2,   Singulair, melatonin, Vistaril, Zyrtec, Symbicort 160, albuterol hfa CPAP  Auto 5-15/ Home O2 sleep/ [employer] Download- compliance 90%, AHI 0.8/ hr Body weight today- 268 lbs Covid vax- none Flu vax- declines -----pt is here for cpap compliance and temazepam dosagenot working well Doing well with CPAP and full-face mask is good. Discussed insomnia- can try increasing temazepam to 30 mg. Discussed oxygen- trying to do without it.  Long discussion of Covid vaccine.   ROS-see HPI   + = positive Constitutional:    weight loss, night sweats, fevers, chills, +fatigue, lassitude. HEENT:    +headaches, difficulty swallowing,  tooth/dental problems, sore throat,       +sneezing, itching, ear ache, +nasal congestion, post nasal drip, snoring CV:    chest pain, orthopnea, PND, +swelling in lower extremities, anasarca,                                   dizziness, palpitations Resp:   shortness of breath with exertion or at rest.                +productive cough,   +non-productive cough, coughing up of blood.              change in color of mucus.  +wheezing.   Skin:    rash or lesions. GI:  No-   heartburn, indigestion, abdominal pain, nausea, vomiting, diarrhea,                 change in bowel habits, loss of appetite GU: dysuria, change in color of urine, no urgency or frequency.   flank pain. MS:   joint pain, stiffness, decreased range of motion, back pain. Neuro-     nothing unusual Psych:  change in mood or affect.  depression or anxiety.   memory loss.  OBJ- Physical Exam General- Alert, Oriented, Affect-appropriate, Distress- none acute, + obese Skin- rash-none, lesions- none, excoriation- none Lymphadenopathy- none Head- atraumatic            Eyes- Gross vision intact, PERRLA, conjunctivae and secretions clear            Ears- Hearing, canals-normal            Nose- Clear, no-Septal dev, mucus, polyps, erosion, perforation             Throat- Mallampati III , mucosa clear ,  drainage- none, tonsils- atrophic,  + many missing teeth Neck- flexible , trachea midline, no stridor , thyroid nl, carotid no bruit Chest - symmetrical excursion , unlabored           Heart/CV- RRR , no murmur , no gallop  , no rub, nl s1 s2                           - JVD- none , edema- none, stasis changes- none, varices- none           Lung- clear to P&A, wheeze- none, cough- none , dullness-none, rub- none           Chest wall-  Abd-  Br/ Gen/ Rectal- Not done, not indicated Extrem- cyanosis- none, clubbing, none, atrophy- none, strength- nl Neuro- grossly intact to observation

## 2020-04-17 ENCOUNTER — Encounter: Payer: Self-pay | Admitting: Internal Medicine

## 2020-04-17 ENCOUNTER — Ambulatory Visit (INDEPENDENT_AMBULATORY_CARE_PROVIDER_SITE_OTHER): Payer: Medicare Other | Admitting: Internal Medicine

## 2020-04-17 ENCOUNTER — Other Ambulatory Visit: Payer: Self-pay

## 2020-04-17 VITALS — BP 132/76 | HR 108 | Temp 97.4°F | Ht 64.0 in | Wt 268.8 lb

## 2020-04-17 DIAGNOSIS — G4734 Idiopathic sleep related nonobstructive alveolar hypoventilation: Secondary | ICD-10-CM

## 2020-04-17 DIAGNOSIS — F5101 Primary insomnia: Secondary | ICD-10-CM

## 2020-04-17 DIAGNOSIS — G4733 Obstructive sleep apnea (adult) (pediatric): Secondary | ICD-10-CM

## 2020-04-17 MED ORDER — TEMAZEPAM 30 MG PO CAPS: 30.0000 mg | ORAL_CAPSULE | Freq: Every evening | ORAL | 5 refills | Status: DC | PRN

## 2020-04-17 NOTE — Patient Instructions (Signed)
We can continue CPAP auto 5-15  Order- Overnight oximetry on CPAP, room air   Dx nocturnal hypoxemia  I do recommend the covid vaccine  Script sent increasing the strength of your temazepam. See if this works better for you.

## 2020-05-09 DIAGNOSIS — J0140 Acute pansinusitis, unspecified: Secondary | ICD-10-CM | POA: Diagnosis not present

## 2020-05-09 DIAGNOSIS — R0981 Nasal congestion: Secondary | ICD-10-CM | POA: Diagnosis not present

## 2020-05-10 ENCOUNTER — Encounter: Payer: Self-pay | Admitting: Internal Medicine

## 2020-05-10 DIAGNOSIS — G4734 Idiopathic sleep related nonobstructive alveolar hypoventilation: Secondary | ICD-10-CM

## 2020-05-10 DIAGNOSIS — G4733 Obstructive sleep apnea (adult) (pediatric): Secondary | ICD-10-CM | POA: Diagnosis not present

## 2020-05-10 HISTORY — DX: Idiopathic sleep related nonobstructive alveolar hypoventilation: G47.34

## 2020-05-10 NOTE — Assessment & Plan Note (Signed)
She's trying without O2. Plan- overnight oximetry on CPAP/ room air

## 2020-05-10 NOTE — Assessment & Plan Note (Signed)
Benefits from CPAP with good compliance and control Plan- continue auto 5-15 

## 2020-05-10 NOTE — Assessment & Plan Note (Signed)
Reviewed sleep hygiene and caution with meds. Plan- try increasing temazepam to 30 mg

## 2020-05-13 DIAGNOSIS — I152 Hypertension secondary to endocrine disorders: Secondary | ICD-10-CM | POA: Diagnosis not present

## 2020-05-13 DIAGNOSIS — E1159 Type 2 diabetes mellitus with other circulatory complications: Secondary | ICD-10-CM | POA: Diagnosis not present

## 2020-05-14 DIAGNOSIS — J449 Chronic obstructive pulmonary disease, unspecified: Secondary | ICD-10-CM | POA: Diagnosis not present

## 2020-05-22 DIAGNOSIS — E785 Hyperlipidemia, unspecified: Secondary | ICD-10-CM | POA: Diagnosis not present

## 2020-05-22 DIAGNOSIS — E1159 Type 2 diabetes mellitus with other circulatory complications: Secondary | ICD-10-CM | POA: Diagnosis not present

## 2020-05-29 DIAGNOSIS — E1159 Type 2 diabetes mellitus with other circulatory complications: Secondary | ICD-10-CM | POA: Diagnosis not present

## 2020-05-29 DIAGNOSIS — I152 Hypertension secondary to endocrine disorders: Secondary | ICD-10-CM | POA: Diagnosis not present

## 2020-05-29 DIAGNOSIS — E785 Hyperlipidemia, unspecified: Secondary | ICD-10-CM | POA: Diagnosis not present

## 2020-06-11 DIAGNOSIS — G4733 Obstructive sleep apnea (adult) (pediatric): Secondary | ICD-10-CM | POA: Diagnosis not present

## 2020-06-14 DIAGNOSIS — J449 Chronic obstructive pulmonary disease, unspecified: Secondary | ICD-10-CM | POA: Diagnosis not present

## 2020-06-18 DIAGNOSIS — Z23 Encounter for immunization: Secondary | ICD-10-CM | POA: Diagnosis not present

## 2020-06-19 DIAGNOSIS — M1711 Unilateral primary osteoarthritis, right knee: Secondary | ICD-10-CM | POA: Diagnosis not present

## 2020-07-08 DIAGNOSIS — J029 Acute pharyngitis, unspecified: Secondary | ICD-10-CM | POA: Diagnosis not present

## 2020-07-08 DIAGNOSIS — Z1152 Encounter for screening for COVID-19: Secondary | ICD-10-CM | POA: Diagnosis not present

## 2020-07-08 DIAGNOSIS — R059 Cough, unspecified: Secondary | ICD-10-CM | POA: Diagnosis not present

## 2020-07-08 DIAGNOSIS — J020 Streptococcal pharyngitis: Secondary | ICD-10-CM | POA: Diagnosis not present

## 2020-07-08 DIAGNOSIS — Z20822 Contact with and (suspected) exposure to covid-19: Secondary | ICD-10-CM | POA: Diagnosis not present

## 2020-07-15 DIAGNOSIS — J449 Chronic obstructive pulmonary disease, unspecified: Secondary | ICD-10-CM | POA: Diagnosis not present

## 2020-07-24 ENCOUNTER — Other Ambulatory Visit: Payer: Self-pay | Admitting: Internal Medicine

## 2020-07-24 DIAGNOSIS — R062 Wheezing: Secondary | ICD-10-CM

## 2020-07-24 DIAGNOSIS — G4733 Obstructive sleep apnea (adult) (pediatric): Secondary | ICD-10-CM | POA: Insufficient documentation

## 2020-07-24 DIAGNOSIS — Z8669 Personal history of other diseases of the nervous system and sense organs: Secondary | ICD-10-CM | POA: Insufficient documentation

## 2020-07-24 DIAGNOSIS — M1990 Unspecified osteoarthritis, unspecified site: Secondary | ICD-10-CM | POA: Insufficient documentation

## 2020-07-24 DIAGNOSIS — I639 Cerebral infarction, unspecified: Secondary | ICD-10-CM | POA: Insufficient documentation

## 2020-07-31 DIAGNOSIS — J020 Streptococcal pharyngitis: Secondary | ICD-10-CM | POA: Diagnosis not present

## 2020-07-31 DIAGNOSIS — R059 Cough, unspecified: Secondary | ICD-10-CM | POA: Diagnosis not present

## 2020-08-01 DIAGNOSIS — J020 Streptococcal pharyngitis: Secondary | ICD-10-CM | POA: Diagnosis not present

## 2020-08-01 DIAGNOSIS — R220 Localized swelling, mass and lump, head: Secondary | ICD-10-CM | POA: Diagnosis not present

## 2020-08-05 ENCOUNTER — Ambulatory Visit: Payer: Medicare Other | Admitting: Cardiology

## 2020-08-06 DIAGNOSIS — T783XXA Angioneurotic edema, initial encounter: Secondary | ICD-10-CM | POA: Diagnosis not present

## 2020-08-12 DIAGNOSIS — J449 Chronic obstructive pulmonary disease, unspecified: Secondary | ICD-10-CM | POA: Diagnosis not present

## 2020-08-16 DIAGNOSIS — S92522A Displaced fracture of medial phalanx of left lesser toe(s), initial encounter for closed fracture: Secondary | ICD-10-CM | POA: Diagnosis not present

## 2020-08-16 DIAGNOSIS — M79675 Pain in left toe(s): Secondary | ICD-10-CM | POA: Diagnosis not present

## 2020-08-16 DIAGNOSIS — S90935D Unspecified superficial injury of left lesser toe(s), subsequent encounter: Secondary | ICD-10-CM | POA: Diagnosis not present

## 2020-08-25 DIAGNOSIS — S92525A Nondisplaced fracture of medial phalanx of left lesser toe(s), initial encounter for closed fracture: Secondary | ICD-10-CM | POA: Diagnosis not present

## 2020-08-28 DIAGNOSIS — Z5321 Procedure and treatment not carried out due to patient leaving prior to being seen by health care provider: Secondary | ICD-10-CM | POA: Diagnosis not present

## 2020-09-01 DIAGNOSIS — S9782XA Crushing injury of left foot, initial encounter: Secondary | ICD-10-CM | POA: Diagnosis not present

## 2020-09-09 ENCOUNTER — Ambulatory Visit: Payer: Medicare Other | Admitting: Allergy

## 2020-09-09 DIAGNOSIS — G4733 Obstructive sleep apnea (adult) (pediatric): Secondary | ICD-10-CM | POA: Diagnosis not present

## 2020-09-12 DIAGNOSIS — J449 Chronic obstructive pulmonary disease, unspecified: Secondary | ICD-10-CM | POA: Diagnosis not present

## 2020-09-17 DIAGNOSIS — E785 Hyperlipidemia, unspecified: Secondary | ICD-10-CM | POA: Diagnosis not present

## 2020-09-17 DIAGNOSIS — E1159 Type 2 diabetes mellitus with other circulatory complications: Secondary | ICD-10-CM | POA: Diagnosis not present

## 2020-09-23 DIAGNOSIS — S92525D Nondisplaced fracture of medial phalanx of left lesser toe(s), subsequent encounter for fracture with routine healing: Secondary | ICD-10-CM | POA: Diagnosis not present

## 2020-09-25 DIAGNOSIS — M1712 Unilateral primary osteoarthritis, left knee: Secondary | ICD-10-CM | POA: Diagnosis not present

## 2020-09-29 ENCOUNTER — Encounter: Payer: Self-pay | Admitting: Cardiology

## 2020-09-29 ENCOUNTER — Ambulatory Visit (INDEPENDENT_AMBULATORY_CARE_PROVIDER_SITE_OTHER): Payer: Medicare Other | Admitting: Cardiology

## 2020-09-29 ENCOUNTER — Other Ambulatory Visit: Payer: Self-pay

## 2020-09-29 ENCOUNTER — Ambulatory Visit (INDEPENDENT_AMBULATORY_CARE_PROVIDER_SITE_OTHER): Payer: Medicare Other

## 2020-09-29 VITALS — BP 102/62 | HR 71 | Ht 63.5 in | Wt 282.0 lb

## 2020-09-29 DIAGNOSIS — G4733 Obstructive sleep apnea (adult) (pediatric): Secondary | ICD-10-CM

## 2020-09-29 DIAGNOSIS — R0600 Dyspnea, unspecified: Secondary | ICD-10-CM | POA: Diagnosis not present

## 2020-09-29 DIAGNOSIS — R0609 Other forms of dyspnea: Secondary | ICD-10-CM

## 2020-09-29 DIAGNOSIS — I6930 Unspecified sequelae of cerebral infarction: Secondary | ICD-10-CM

## 2020-09-29 DIAGNOSIS — R002 Palpitations: Secondary | ICD-10-CM

## 2020-09-29 NOTE — Patient Instructions (Signed)
Medication Instructions:  Your physician recommends that you continue on your current medications as directed. Please refer to the Current Medication list given to you today.  *If you need a refill on your cardiac medications before your next appointment, please call your pharmacy*   Lab Work: None If you have labs (blood work) drawn today and your tests are completely normal, you will receive your results only by: . MyChart Message (if you have MyChart) OR . A paper copy in the mail If you have any lab test that is abnormal or we need to change your treatment, we will call you to review the results.   Testing/Procedures: A zio monitor was ordered today. It will remain on for 7 days. You will then return monitor and event diary in provided box. It takes 1-2 weeks for report to be downloaded and returned to us. We will call you with the results. If monitor falls off or has orange flashing light, please call Zio for further instructions.      Follow-Up: At [facility], you and your health needs are our priority.  As part of our continuing mission to provide you with exceptional heart care, we have created designated Provider Care Teams.  These Care Teams include your primary Cardiologist (physician) and Advanced Practice Providers (APPs -  Physician Assistants and Nurse Practitioners) who all work together to provide you with the care you need, when you need it.  We recommend signing up for the patient portal called "MyChart".  Sign up information is provided on this After Visit Summary.  MyChart is used to connect with patients for Virtual Visits (Telemedicine).  Patients are able to view lab/test results, encounter notes, upcoming appointments, etc.  Non-urgent messages can be sent to your provider as well.   To learn more about what you can do with MyChart, go to [email or web address].    Your next appointment:   6 month(s)  The format for your next appointment:   In  Person  Provider:   Robert Krasowski, MD   Other Instructions    

## 2020-09-29 NOTE — Addendum Note (Signed)
Addended by: Hazle Quant on: 09/29/2020 01:55 PM   Modules accepted: Orders

## 2020-09-29 NOTE — Progress Notes (Signed)
Cardiology Office Note:    Date:  09/29/2020   ID:  Veronica Leblanc, DOB 07-Aug-1960, MRN [id number]  PCP:  Jerene Canny, PA-C  Cardiologist:  Gypsy Balsam, MD    Referring MD: Jerene Canny,*   Chief Complaint  Patient presents with  . Follow-up  I still have some palpitations  History of Present Illness:    Veronica Leblanc is a 60 y.o. female with past medical history significant for CVA which happened 20 years ago, that was ischemic episode, dyspnea on exertion, dyslipidemia, diabetes.  She comes today to my office for follow-up.  She still complains of having some palpitations does become more frequent lasting for about a minute happening maybe once or twice a week.  No dizziness no chest pain or shortness of breath associated with this sensation.  She also described to have fact that she is short of breath while walking but admits that she does not do much she does have a problem with the knee in the better fact yesterday she did get steroid injection with not much relief yet.  Past Medical History:  Diagnosis Date  . Acute, but ill-defined, cerebrovascular disease   . Arthritis   . Asthma   . Asthma, mild intermittent 03/21/2019  . Chronic pain of left knee 09/07/2018  . CVA (cerebral vascular accident) (HCC)   . CVA (cerebral vascular accident) (HCC)    Age 55  . Dyspnea on exertion 09/18/2019  . Endometriosis, site unspecified   . History of migraine headaches   . Insomnia 08/30/2019  . Late effect of cerebrovascular accident (CVA) 09/18/2019  . Nocturnal hypoxemia 05/10/2020  . Obstructive sleep apnea 09/09/2010   PSG from 05/06/10>>AHI 9.6, SpO2 low 77% from [place].   CPAP titration 07/16/10>>CPAP 8 cm from [place].   . OSA (obstructive sleep apnea)   . Palpitations 09/18/2019  . S/P left knee arthroscopy 05/08/2019  . Type 2 diabetes mellitus without complication, without long-term current use of insulin (HCC) 09/18/2019    Past Surgical  History:  Procedure Laterality Date  . APPENDECTOMY    . CESAREAN SECTION    . CHOLECYSTECTOMY    . ganglionectomy     c2/c3 right and left side  . NECK SURGERY     5 times  . presacral neurectomy    . SALPINGOOPHORECTOMY     left  . TOTAL ABDOMINAL HYSTERECTOMY      Current Medications: Current Meds  Medication Sig  . albuterol (VENTOLIN HFA) 108 (90 Base) MCG/ACT inhaler INHALE 2 PUFFS EVERY 4 HOURS AS NEEDED (Patient taking differently: Inhale 2 puffs into the lungs every 4 (four) hours as needed for wheezing or shortness of breath.)  . aspirin EC 81 MG tablet Take 81 mg by mouth daily. Swallow whole.  . budesonide-formoterol (SYMBICORT) 160-4.5 MCG/ACT inhaler Inhale 2 puffs into the lungs 2 (two) times daily.  . cetirizine (ZYRTEC) 10 MG tablet Take 10 mg by mouth daily.  . citalopram (CELEXA) 40 MG tablet Take 40 mg by mouth daily. Takes 1 tablet at bedtime.  . furosemide (LASIX) 20 MG tablet Take 20 mg by mouth daily as needed for fluid. Takes 1/2 tablet dailey as needed for edema.  . hydrocortisone (ANUSOL-HC) 2.5 % rectal cream Apply 1 application topically daily as needed for rash.  . hydrOXYzine (VISTARIL) 25 MG capsule Take 25 mg by mouth daily.  Marland Kitchen ibuprofen (ADVIL) 800 MG tablet Take 800 mg by mouth every 8 (eight) hours as needed for mild pain or  moderate pain.  Marland Kitchen JARDIANCE 10 MG TABS tablet Take 10 mg by mouth daily.  Marland Kitchen lisinopril (ZESTRIL) 40 MG tablet Take 40 mg by mouth daily.  . Melatonin 1 MG TABS Take 9 mg by mouth at bedtime.  . metoprolol succinate (TOPROL-XL) 25 MG 24 hr tablet Take 25 mg by mouth daily.  . montelukast (SINGULAIR) 10 MG tablet Take 10 mg by mouth at bedtime.  Marland Kitchen nystatin (MYCOSTATIN/NYSTOP) powder Apply 1 application topically 2 (two) times daily as needed (rash).  . rosuvastatin (CRESTOR) 20 MG tablet Take 20 mg by mouth at bedtime.  . temazepam (RESTORIL) 30 MG capsule Take 1 capsule (30 mg total) by mouth at bedtime as needed for sleep.      Allergies:   Butorphanol, Metoclopramide, Other, Amitriptyline hcl, Cefazolin, Ceftriaxone sodium, Ciprofloxacin, Darvocet [propoxyphene n-acetaminophen], Penicillins, Prochlorperazine edisylate, Promethazine hcl, Sulfamethoxazole, Sulfonamide derivatives, and Sumatriptan   Social History   Socioeconomic History  . Marital status: Divorced    Spouse name: Not on file  . Number of children: Not on file  . Years of education: Not on file  . Highest education level: Not on file  Occupational History  . 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 Smoking status: Never Smoker  . Smokeless tobacco: Never Used  Substance and Sexual Activity  . Alcohol use: Not on file  . Drug use: Not on file  . Sexual activity: Not on file  Other Topics Concern  . Not on file  Social History Narrative  . Not on file   Social Determinants of Health   Financial Resource Strain: Not on file  Food Insecurity: Not on file  Transportation Needs: Not on file  Physical Activity: Not on file  Stress: Not on file  Social Connections: Not on file     Family History: The patient's family history includes Breast cancer in her maternal aunt; Colon cancer in her cousin; Diabetes in her brother and father; Heart disease in her maternal grandfather and paternal grandfather; Hypertension in her brother, father, and mother; Pancreatic cancer in her maternal grandmother. ROS:   Please see the history of present illness.    All 14 point review of systems negative except as described per history of present illness  EKGs/Labs/Other Studies Reviewed:      Recent Labs: No results found for requested labs within last 8760 hours.  Recent Lipid Panel No results found for: CHOL, TRIG, HDL, CHOLHDL, VLDL, LDLCALC, LDLDIRECT  Physical Exam:    VS:  BP 102/62 (BP Location: Left Arm, Patient Position: Sitting)   Pulse 71   Ht 5' 3.5" (1.613 m)   Wt 282 lb (127.9 kg)   LMP  (LMP Unknown)   SpO2  95%   BMI 49.17 kg/m     Wt Readings from Last 3 Encounters:  09/29/20 282 lb (127.9 kg)  04/17/20 268 lb 12.8 oz (121.9 kg)  03/04/20 266 lb (120.7 kg)     GEN:  Well nourished, well developed in no acute distress HEENT: Normal NECK: No JVD; No carotid bruits LYMPHATICS: No lymphadenopathy CARDIAC: RRR, no murmurs, no rubs, no gallops RESPIRATORY:  Clear to auscultation without rales, wheezing or rhonchi  ABDOMEN: Soft, non-tender, non-distended MUSCULOSKELETAL:  No edema; No deformity  SKIN: Warm and dry LOWER EXTREMITIES: no swelling NEUROLOGIC:  Alert and oriented x 3 PSYCHIATRIC:  Normal affect   ASSESSMENT:    1. Late effect of cerebrovascular accident (CVA)   2. Palpitations  3. Obstructive sleep apnea   4. Dyspnea on exertion    PLAN:    In order of problems listed above:  1. Palpitations: I will get another monitor on our to make sure were not missing atrial fibrillation.  She does have history of CVA and exact etiology of this phenomena has not been discovered so far I am worried about potentially atrial fibrillation which of course will force [place] to do full anticoagulation. 2. Late effect of CVA.  Recovered quite nicely but again issue about potential atrial fibrillation 3. Dyspnea on exertion stable. 4. Diabetes she is checking her sugar on regular basis her hemoglobin A1c was 6.2 this is from 05/22/2020 and there is data is from [facility]. 5. Dyslipidemia her LDL is 79 HDL 34 05/22/2020 data from [facility].  She is on Crestor 20 which I will continue.   Medication Adjustments/Labs and Tests Ordered: Current medicines are reviewed at length with the patient today.  Concerns regarding medicines are outlined above.  No orders of the defined types were placed in this encounter.  Medication changes: No orders of the defined types were placed in this encounter.   Signed, Georgeanna Lea, MD, [facility] 09/29/2020 1:43 PM    [facility]

## 2020-09-30 DIAGNOSIS — E1169 Type 2 diabetes mellitus with other specified complication: Secondary | ICD-10-CM | POA: Diagnosis not present

## 2020-09-30 DIAGNOSIS — Z139 Encounter for screening, unspecified: Secondary | ICD-10-CM | POA: Diagnosis not present

## 2020-09-30 DIAGNOSIS — I152 Hypertension secondary to endocrine disorders: Secondary | ICD-10-CM | POA: Diagnosis not present

## 2020-09-30 DIAGNOSIS — E1159 Type 2 diabetes mellitus with other circulatory complications: Secondary | ICD-10-CM | POA: Diagnosis not present

## 2020-09-30 DIAGNOSIS — Z0000 Encounter for general adult medical examination without abnormal findings: Secondary | ICD-10-CM | POA: Diagnosis not present

## 2020-09-30 DIAGNOSIS — E785 Hyperlipidemia, unspecified: Secondary | ICD-10-CM | POA: Diagnosis not present

## 2020-09-30 DIAGNOSIS — Z136 Encounter for screening for cardiovascular disorders: Secondary | ICD-10-CM | POA: Diagnosis not present

## 2020-10-01 ENCOUNTER — Telehealth: Payer: Self-pay | Admitting: Cardiology

## 2020-10-01 NOTE — Telephone Encounter (Signed)
Flo from [employer] called in regards to a Zio Monitor, pt only was able to wear it for 2 days and they have already sent out a replacement. She states she just wanted to let Dr. Bing Matter know!  [employer] Reference # [id number]

## 2020-10-07 ENCOUNTER — Ambulatory Visit: Payer: Medicare Other | Admitting: Allergy

## 2020-10-12 DIAGNOSIS — J449 Chronic obstructive pulmonary disease, unspecified: Secondary | ICD-10-CM | POA: Diagnosis not present

## 2020-10-14 ENCOUNTER — Ambulatory Visit (INDEPENDENT_AMBULATORY_CARE_PROVIDER_SITE_OTHER): Payer: Medicare Other | Admitting: Allergy

## 2020-10-14 ENCOUNTER — Other Ambulatory Visit: Payer: Self-pay

## 2020-10-14 ENCOUNTER — Encounter: Payer: Self-pay | Admitting: Allergy

## 2020-10-14 VITALS — BP 126/82 | HR 78 | Resp 16 | Ht 64.0 in | Wt 277.2 lb

## 2020-10-14 DIAGNOSIS — J4530 Mild persistent asthma, uncomplicated: Secondary | ICD-10-CM

## 2020-10-14 DIAGNOSIS — J3089 Other allergic rhinitis: Secondary | ICD-10-CM | POA: Diagnosis not present

## 2020-10-14 DIAGNOSIS — G4733 Obstructive sleep apnea (adult) (pediatric): Secondary | ICD-10-CM

## 2020-10-14 DIAGNOSIS — T783XXD Angioneurotic edema, subsequent encounter: Secondary | ICD-10-CM

## 2020-10-14 DIAGNOSIS — T50905D Adverse effect of unspecified drugs, medicaments and biological substances, subsequent encounter: Secondary | ICD-10-CM

## 2020-10-14 DIAGNOSIS — H1013 Acute atopic conjunctivitis, bilateral: Secondary | ICD-10-CM | POA: Diagnosis not present

## 2020-10-14 DIAGNOSIS — L509 Urticaria, unspecified: Secondary | ICD-10-CM | POA: Diagnosis not present

## 2020-10-14 DIAGNOSIS — R002 Palpitations: Secondary | ICD-10-CM | POA: Diagnosis not present

## 2020-10-14 MED ORDER — EPINEPHRINE 0.3 MG/0.3ML IJ SOAJ: 0.3000 mg | INTRAMUSCULAR | 2 refills | Status: DC | PRN

## 2020-10-14 NOTE — Patient Instructions (Addendum)
Angioedema, isolated -Tongue swelling after starting azithromycin.  Also was on lisinopril. -Discussed today that lisinopril (ACE inhibitor group) have a well-known side effect history of causing angioedema especially of the oral cavity.  Since she has had tongue swelling will recommend that she continue off lisinopril and avoid ACE inhibitor's at this time. -Discussed today that her tongue swelling could also be driven by the azithromycin as she has had this medication before which could have been a sensitizing period and with subsequent use developed an IgE mediated drug response with tongue swelling.  We do not have standardized testing for azithromycin thus in order to determine if she is allergic or not would need to perform a graded dose challenge in the office.  I recommend this be done no sooner than 6 months or so.  That we will give time for the lisinopril to out of her system and to see if she will have any further episodes of tongue swelling. -Also discussed that swelling without rash can be associated with hereditary angioedema and will obtain the panel for this.  Hereditary angioedema is a nonhistamine driven process that is treated very differently than allergic reactions. -Recommend that she have access to an epinephrine device as she does have other medication allergy as well as reactions to strong odors -Should significant symptoms recur or new symptoms occur, a journal is to be kept recording any foods eaten, beverages consumed, medications taken, activities performed, and environmental conditions within a 6 hour time period prior to the onset of symptoms. For any symptoms concerning for anaphylaxis, epinephrine is to be administered and 911 is to be called immediately.   Urticaria with angioedema -Continue current medication avoidance as well as avoidance of strong odors -Epinephrine device as above  Allergic rhinitis with conjunctivitis -Environmental allergy skin testing is  positive to molds and dust mites.  We will obtain serum IgE levels to determine if she has any more environmental allergies like her history to ragweed in the past -Allergen avoidance measures discussed/handouts provided -For allergy symptom control can use long-acting antihistamine like Allegra or Xyzal and to see if these are more effective than Zyrtec -For itchy, watery eyes can use over-the-counter Pataday 1 drop each eye daily as needed -For nasal congestion and drainage can use nasal steroid spray like Flonase, Rhinocort or Nasacort which are over-the-counter  Asthma/sleep apnea -Continue follow-up care with your pulmonologist Dr. Maple Hudson -Continue Symbicort 160 mcg 2 puffs twice a day -Continue Singulair 10 mg daily -have access to albuterol inhaler 2 puffs every 4-6 hours as needed for cough/wheeze/shortness of breath/chest tightness.  May use 15-20 minutes prior to activity.   Monitor frequency of use.    Follow-up in 3 to 4 months or sooner if needed

## 2020-10-14 NOTE — Progress Notes (Signed)
New Patient Note  RE: Veronica Leblanc MRN: [id number] DOB: 08/03/1960 Date of Office Visit: 10/14/2020  Referring provider: Abner GreenspanHodges, Beth, MD Primary care provider: Jerene CannyUnderkoffler, Kimberly, PA-C  Chief Complaint: medication reaction  History of present illness: Veronica GistJennifer Ann Leblanc is a 60 y.o. female presenting today for evaluation of angioedema.   She has had tongue swelling. She took a course of azithromycin first in March 2022 and states had no issues.  She has had azithromycin prior to this as well with no issues.  She was prescribed a second round of azithromycin in April 2022 when she states she developed tongue swelling.   She states she was being treated for strep throat.  She initially was tested for both Covid and strep and her covid testing was negative but strep was positive.  She states a covid test was sent off that did return back positive.  The azithromycin was prescribed due to positive strep test.   She provided pictures of the tongue swelling that is quite evident.  She states she went to the ED with a tongue swelling and she was treated with prednisone course.   She does not recall any rash with this swelling. She states the tongue swelling did occur after the first dose of azithromycin in the second round.  She however was also on lisinopril and states she stopped this several days ago as she did some research that lisinopril can also cause swelling.  She has not notified her PCP yet in regards to stopping the lisinopril.  She has a host of other medication allergies most of which have caused hives and or swelling. With penicillins she reports developing hives and swelling.  This occurred in childhood.  Cefazolin and ceftriaxone also reports hives and swelling as well. She reports having several surgeries where she required keflex and states she was desensitized in order to take it was in the mid 90s.   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  She states certain perfumes/odors has caused her to have swelling and hives as well.    No history of food allergy.   She reports itchy/watery eyes, nasal congestion, nasal drainage, facial swelling and symptoms are year-round but worse in fall and spring.  She states she is allergic to ragweed that she is aware of.  She did do allergen immunotherapy in the 80s.  She takes zyrtec as needed and sometimes works.  She also reports taking benadryl as needed.    She follows with Dr. Maple HudsonYoung in pulmonology for asthma.  She is on symbicort 160mcg 2 puffs twice a day.  She is also on singulair daily.    Review of systems: Review of Systems  Constitutional: Negative.   HENT:       See HPI  Eyes:       See HPI  Respiratory: Negative.   Cardiovascular: Negative.   Gastrointestinal: Negative.   Musculoskeletal: Negative.   Skin: Negative.   Neurological: Negative.     All other systems negative unless noted above in HPI  Past medical history: Past Medical History:  Diagnosis Date  . Acute, but ill-defined, cerebrovascular disease   . Arthritis   . Asthma   . Asthma, mild intermittent 03/21/2019  . Chronic pain of left knee 09/07/2018  . CVA (cerebral vascular accident) (HCC)   . CVA (cerebral vascular accident) (HCC)    Age 60  . Dyspnea on exertion 09/18/2019  .  Eczema   . Endometriosis, site unspecified   . History of migraine headaches   . Insomnia 08/30/2019  . Late effect of cerebrovascular accident (CVA) 09/18/2019  . Nocturnal hypoxemia 05/10/2020  . Obstructive sleep apnea 09/09/2010   PSG from 05/06/10>>AHI 9.6, SpO2 low 77% from [place].   CPAP titration 07/16/10>>CPAP 8 cm from [place].   . OSA (obstructive sleep apnea)   . Palpitations 09/18/2019  . S/P left knee arthroscopy 05/08/2019  . Type 2 diabetes mellitus without complication, without long-term current use of insulin (HCC) 09/18/2019  . Urticaria     Past  surgical history: Past Surgical History:  Procedure Laterality Date  . APPENDECTOMY    . CESAREAN SECTION    . CHOLECYSTECTOMY    . ganglionectomy     c2/c3 right and left side  . NECK SURGERY     5 times  . presacral neurectomy    . SALPINGOOPHORECTOMY     left  . TOTAL ABDOMINAL HYSTERECTOMY      Family history:  Family History  Problem Relation Age of Onset  . Diabetes Father   . Hypertension Father   . Diabetes Brother        multiple  . Hypertension Mother   . Hypertension Brother   . Breast cancer Maternal Aunt   . Heart disease Maternal Grandfather   . Heart disease Paternal Grandfather   . Colon cancer Cousin   . Pancreatic cancer Maternal Grandmother     Social history: She lives in an apartment with carpeting with electric heating and central cooling.  There is a dog, cat and gecko outside the home.  There is no concern for roaches in the home.  She does not report an occupation at this time.  She denies a smoking history.  She is occasionally exposed to cigarette smoke in the car.  Medication List: Current Outpatient Medications  Medication Sig Dispense Refill  . albuterol (VENTOLIN HFA) 108 (90 Base) MCG/ACT inhaler INHALE 2 PUFFS EVERY 4 HOURS AS NEEDED (Patient taking differently: Inhale 2 puffs into the lungs every 4 (four) hours as needed for wheezing or shortness of breath.) 8.5 g 3  . aspirin EC 81 MG tablet Take 81 mg by mouth daily. Swallow whole.    . budesonide-formoterol (SYMBICORT) 160-4.5 MCG/ACT inhaler Inhale 2 puffs into the lungs 2 (two) times daily.    . cetirizine (ZYRTEC) 10 MG tablet Take 10 mg by mouth daily.    . citalopram (CELEXA) 40 MG tablet Take 40 mg by mouth daily. Takes 1 tablet at bedtime.    Marland Kitchen EPINEPHrine (AUVI-Q) 0.3 mg/0.3 mL IJ SOAJ injection Inject 0.3 mg into the muscle as needed for anaphylaxis. As directed for life-threatening allergic reactions 2 each 2  . ibuprofen (ADVIL) 800 MG tablet Take 800 mg by mouth every 8  (eight) hours as needed for mild pain or moderate pain.    Marland Kitchen JARDIANCE 10 MG TABS tablet Take 10 mg by mouth daily.    Marland Kitchen lisinopril (ZESTRIL) 40 MG tablet Take 40 mg by mouth daily.    . Melatonin 1 MG TABS Take 9 mg by mouth at bedtime.    . metoprolol succinate (TOPROL-XL) 25 MG 24 hr tablet Take 25 mg by mouth daily.    . montelukast (SINGULAIR) 10 MG tablet Take 10 mg by mouth at bedtime.    . rosuvastatin (CRESTOR) 20 MG tablet Take 20 mg by mouth at bedtime.    . temazepam (RESTORIL) 30 MG capsule Take  1 capsule (30 mg total) by mouth at bedtime as needed for sleep. 30 capsule 5   No current facility-administered medications for this visit.    Known medication allergies: Allergies  Allergen Reactions  . Butorphanol Hives  . Metoclopramide Hives  . Other Other (See Comments) and Swelling    Locks jaw Locks jaw  . Amitriptyline Hcl Hives and Swelling  . Cefazolin Hives and Swelling  . Ceftriaxone Sodium Hives and Swelling  . Ciprofloxacin Hives and Swelling  . Darvocet [Propoxyphene N-Acetaminophen] Hives and Swelling  . Penicillins Hives and Swelling  . Prochlorperazine Edisylate Hives and Swelling  . Promethazine Hcl     Jaw locks up  . Sulfamethoxazole Swelling  . Sulfonamide Derivatives Hives and Swelling  . Sumatriptan Hives and Swelling     Physical examination: Blood pressure 126/82, pulse 78, resp. rate 16, height 5\' 4"  (1.626 m), weight 277 lb 3.2 oz (125.7 kg), SpO2 95 %.  General: Alert, interactive, in no acute distress. HEENT: PERRLA, right think she is at a provider sting also can remember yeah I shared her sting as well as nonswollen was only on the soft things TMs pearly gray, turbinates non-edematous without discharge, post-pharynx non erythematous. Neck: Supple without lymphadenopathy. Lungs: Clear to auscultation without wheezing, rhonchi or rales. {no increased work of breathing. CV: Normal S1, S2 without murmurs. Abdomen: Nondistended,  nontender. Skin: Warm and dry, without lesions or rashes. Extremities:  No clubbing, cyanosis or edema. Neuro:   Grossly intact.  Diagnositics/Labs:  Spirometry: FEV1: 1.09 L 43%, FVC: 1.9 L 60%, ratio consistent with Obstructive with effective pattern  Allergy testing: Environmental allergy skin prick testing is positive to both dust mites and pullulara. Allergy testing results were read and interpreted by provider, documented by clinical staff.   Assessment and plan:   Angioedema, isolated -Tongue swelling after starting azithromycin.  Also was on lisinopril. -Discussed today that lisinopril (ACE inhibitor group) have a well-known side effect history of causing angioedema especially of the oral cavity.  Since she has had tongue swelling will recommend that she continue off lisinopril and avoid ACE inhibitor's at this time. -Discussed today that her tongue swelling could also be driven by the azithromycin as she has had this medication before which could have been a sensitizing period and with subsequent use developed an IgE mediated drug response with tongue swelling.  We do not have standardized testing for azithromycin thus in order to determine if she is allergic or not would need to perform a graded dose challenge in the office.  I recommend this be done no sooner than 6 months or so.  That we will give time for the lisinopril to out of her system and to see if she will have any further episodes of tongue swelling. -Also discussed that swelling without rash can be associated with hereditary angioedema and will obtain the panel for this.  Hereditary angioedema is a nonhistamine driven process that is treated very differently than allergic reactions. -Recommend that she have access to an epinephrine device as she does have other medication allergy as well as reactions to strong odors -Should significant symptoms recur or new symptoms occur, a journal is to be kept recording any foods eaten,  beverages consumed, medications taken, activities performed, and environmental conditions within a 6 hour time period prior to the onset of symptoms. For any symptoms concerning for anaphylaxis, epinephrine is to be administered and 911 is to be called immediately.   Urticaria with angioedema -Continue current medication  avoidance as well as avoidance of strong odors -Epinephrine device as above  Allergic rhinitis with conjunctivitis -Environmental allergy skin testing is positive to molds and dust mites.  We will obtain serum IgE levels to determine if she has any more environmental allergies like her history to ragweed in the past -Allergen avoidance measures discussed/handouts provided -For allergy symptom control can use long-acting antihistamine like Allegra or Xyzal and to see if these are more effective than Zyrtec -For itchy, watery eyes can use over-the-counter Pataday 1 drop each eye daily as needed -For nasal congestion and drainage can use nasal steroid spray like Flonase, Rhinocort or Nasacort which are over-the-counter  Asthma/sleep apnea -Continue follow-up care with your pulmonologist Dr. Maple Hudson -Continue Symbicort 160 mcg 2 puffs twice a day -Continue Singulair 10 mg daily -have access to albuterol inhaler 2 puffs every 4-6 hours as needed for cough/wheeze/shortness of breath/chest tightness.  May use 15-20 minutes prior to activity.   Monitor frequency of use.    Follow-up in 3 to 4 months or sooner if needed  I appreciate the opportunity to take part in Kareli's care. Please do not hesitate to contact me with questions.  Sincerely,   Margo Aye, MD Allergy/Immunology [facility]

## 2020-10-15 ENCOUNTER — Telehealth: Payer: Self-pay | Admitting: Allergy

## 2020-10-15 DIAGNOSIS — H1013 Acute atopic conjunctivitis, bilateral: Secondary | ICD-10-CM | POA: Diagnosis not present

## 2020-10-15 DIAGNOSIS — T783XXD Angioneurotic edema, subsequent encounter: Secondary | ICD-10-CM | POA: Diagnosis not present

## 2020-10-15 DIAGNOSIS — J3089 Other allergic rhinitis: Secondary | ICD-10-CM | POA: Diagnosis not present

## 2020-10-15 NOTE — Telephone Encounter (Signed)
Patient states Epi Pen requires a Prior Authorization for insurance.  Please advise.

## 2020-10-16 MED ORDER — EPINEPHRINE 0.3 MG/0.3ML IJ SOAJ: INTRAMUSCULAR | 3 refills | Status: AC

## 2020-10-16 NOTE — Telephone Encounter (Signed)
Talked with patient and informed her that insurance would not cover the Auvi-Q, but should cover the generic Epipen.  Will send ERX to [facility] for generic Epipen.  Patient agreed with plan.

## 2020-10-17 ENCOUNTER — Other Ambulatory Visit: Payer: Self-pay | Admitting: Internal Medicine

## 2020-10-17 NOTE — Telephone Encounter (Signed)
Temazepam refilled.

## 2020-10-22 LAB — ALLERGENS W/TOTAL IGE AREA 2
Alternaria Alternata IgE: <0.1 kU/L
Aspergillus Fumigatus IgE: <0.1 kU/L
Bermuda Grass IgE: <0.1 kU/L
Cat Dander IgE: <0.1 kU/L
Cedar, Mountain IgE: <0.1 kU/L
Cladosporium Herbarum IgE: <0.1 kU/L
Cockroach, German IgE: 0.13 kU/L — AB
Common Silver Birch IgE: <0.1 kU/L
Cottonwood IgE: <0.1 kU/L
D Farinae IgE: 0.13 kU/L — AB
D Pteronyssinus IgE: 0.25 kU/L — AB
Dog Dander IgE: <0.1 kU/L
Elm, American IgE: 0.12 kU/L — AB
IgE (Immunoglobulin E), Serum: 72 IU/mL (ref 6–495)
Johnson Grass IgE: <0.1 kU/L
Maple/Box Elder IgE: <0.1 kU/L
Mouse Urine IgE: <0.1 kU/L
Oak, White IgE: 0.12 kU/L — AB
Pecan, Hickory IgE: 0.11 kU/L — AB
Penicillium Chrysogen IgE: <0.1 kU/L
Pigweed, Rough IgE: <0.1 kU/L
Ragweed, Short IgE: 0.46 kU/L — AB
Sheep Sorrel IgE Qn: 0.14 kU/L — AB
Timothy Grass IgE: <0.1 kU/L
White Mulberry IgE: <0.1 kU/L

## 2020-10-22 LAB — COMPLEMENT COMPONENT C1Q: Complement C1Q: 14.2 mg/dL (ref 10.3–20.5)

## 2020-10-22 LAB — C1 ESTERASE INHIBITOR: C1INH SerPl-mCnc: 30 mg/dL (ref 21–39)

## 2020-10-22 LAB — C1 ESTERASE INHIBITOR, FUNCTIONAL: C1INH Functional/C1INH Total MFr SerPl: >100 %mean normal

## 2020-10-22 LAB — TRYPTASE: Tryptase: 7.7 ug/L (ref 2.2–13.2)

## 2020-10-22 LAB — C4 COMPLEMENT: Complement C4, Serum: 31 mg/dL (ref 12–38)

## 2020-11-12 DIAGNOSIS — J449 Chronic obstructive pulmonary disease, unspecified: Secondary | ICD-10-CM | POA: Diagnosis not present

## 2020-11-13 DIAGNOSIS — E1159 Type 2 diabetes mellitus with other circulatory complications: Secondary | ICD-10-CM | POA: Diagnosis not present

## 2020-11-13 DIAGNOSIS — Z789 Other specified health status: Secondary | ICD-10-CM | POA: Diagnosis not present

## 2020-11-13 DIAGNOSIS — G8929 Other chronic pain: Secondary | ICD-10-CM | POA: Diagnosis not present

## 2020-11-13 DIAGNOSIS — I152 Hypertension secondary to endocrine disorders: Secondary | ICD-10-CM | POA: Diagnosis not present

## 2020-11-13 DIAGNOSIS — M542 Cervicalgia: Secondary | ICD-10-CM | POA: Diagnosis not present

## 2020-11-14 DIAGNOSIS — R6889 Other general symptoms and signs: Secondary | ICD-10-CM | POA: Diagnosis not present

## 2020-11-14 DIAGNOSIS — Z743 Need for continuous supervision: Secondary | ICD-10-CM | POA: Diagnosis not present

## 2020-11-14 DIAGNOSIS — G43909 Migraine, unspecified, not intractable, without status migrainosus: Secondary | ICD-10-CM | POA: Diagnosis not present

## 2020-11-14 DIAGNOSIS — G4489 Other headache syndrome: Secondary | ICD-10-CM | POA: Diagnosis not present

## 2020-11-14 DIAGNOSIS — G43009 Migraine without aura, not intractable, without status migrainosus: Secondary | ICD-10-CM | POA: Diagnosis not present

## 2020-11-14 DIAGNOSIS — R58 Hemorrhage, not elsewhere classified: Secondary | ICD-10-CM | POA: Diagnosis not present

## 2020-11-14 DIAGNOSIS — R112 Nausea with vomiting, unspecified: Secondary | ICD-10-CM | POA: Diagnosis not present

## 2020-11-19 ENCOUNTER — Other Ambulatory Visit: Payer: Self-pay

## 2020-11-19 DIAGNOSIS — R062 Wheezing: Secondary | ICD-10-CM

## 2020-11-19 MED ORDER — ALBUTEROL SULFATE HFA 108 (90 BASE) MCG/ACT IN AERS: 2.0000 | INHALATION_SPRAY | RESPIRATORY_TRACT | 1 refills | Status: AC | PRN

## 2020-11-21 ENCOUNTER — Encounter: Payer: Self-pay | Admitting: Physical Medicine & Rehabilitation

## 2020-11-21 DIAGNOSIS — Z1231 Encounter for screening mammogram for malignant neoplasm of breast: Secondary | ICD-10-CM | POA: Diagnosis not present

## 2020-12-09 DIAGNOSIS — G4733 Obstructive sleep apnea (adult) (pediatric): Secondary | ICD-10-CM | POA: Diagnosis not present

## 2020-12-11 DIAGNOSIS — I152 Hypertension secondary to endocrine disorders: Secondary | ICD-10-CM | POA: Diagnosis not present

## 2020-12-12 DIAGNOSIS — J449 Chronic obstructive pulmonary disease, unspecified: Secondary | ICD-10-CM | POA: Diagnosis not present

## 2020-12-12 DIAGNOSIS — E1159 Type 2 diabetes mellitus with other circulatory complications: Secondary | ICD-10-CM | POA: Diagnosis not present

## 2020-12-12 DIAGNOSIS — I152 Hypertension secondary to endocrine disorders: Secondary | ICD-10-CM | POA: Diagnosis not present

## 2020-12-16 DIAGNOSIS — E1159 Type 2 diabetes mellitus with other circulatory complications: Secondary | ICD-10-CM | POA: Diagnosis not present

## 2020-12-16 DIAGNOSIS — I152 Hypertension secondary to endocrine disorders: Secondary | ICD-10-CM | POA: Diagnosis not present

## 2020-12-16 DIAGNOSIS — R600 Localized edema: Secondary | ICD-10-CM | POA: Diagnosis not present

## 2020-12-23 DIAGNOSIS — K649 Unspecified hemorrhoids: Secondary | ICD-10-CM | POA: Diagnosis not present

## 2020-12-23 DIAGNOSIS — R319 Hematuria, unspecified: Secondary | ICD-10-CM | POA: Diagnosis not present

## 2020-12-23 DIAGNOSIS — B372 Candidiasis of skin and nail: Secondary | ICD-10-CM | POA: Diagnosis not present

## 2020-12-23 DIAGNOSIS — Z1211 Encounter for screening for malignant neoplasm of colon: Secondary | ICD-10-CM | POA: Diagnosis not present

## 2020-12-23 DIAGNOSIS — Z0001 Encounter for general adult medical examination with abnormal findings: Secondary | ICD-10-CM | POA: Diagnosis not present

## 2020-12-25 DIAGNOSIS — M1712 Unilateral primary osteoarthritis, left knee: Secondary | ICD-10-CM | POA: Diagnosis not present

## 2021-01-12 DIAGNOSIS — E1159 Type 2 diabetes mellitus with other circulatory complications: Secondary | ICD-10-CM | POA: Diagnosis not present

## 2021-01-12 DIAGNOSIS — I152 Hypertension secondary to endocrine disorders: Secondary | ICD-10-CM | POA: Diagnosis not present

## 2021-01-12 DIAGNOSIS — J449 Chronic obstructive pulmonary disease, unspecified: Secondary | ICD-10-CM | POA: Diagnosis not present

## 2021-01-16 DIAGNOSIS — L739 Follicular disorder, unspecified: Secondary | ICD-10-CM | POA: Diagnosis not present

## 2021-01-27 DIAGNOSIS — L739 Follicular disorder, unspecified: Secondary | ICD-10-CM | POA: Diagnosis not present

## 2021-01-27 DIAGNOSIS — Z23 Encounter for immunization: Secondary | ICD-10-CM | POA: Diagnosis not present

## 2021-01-27 DIAGNOSIS — E1169 Type 2 diabetes mellitus with other specified complication: Secondary | ICD-10-CM | POA: Diagnosis not present

## 2021-02-02 DIAGNOSIS — W19XXXA Unspecified fall, initial encounter: Secondary | ICD-10-CM | POA: Diagnosis not present

## 2021-02-02 DIAGNOSIS — M545 Low back pain, unspecified: Secondary | ICD-10-CM | POA: Diagnosis not present

## 2021-02-02 DIAGNOSIS — R6889 Other general symptoms and signs: Secondary | ICD-10-CM | POA: Diagnosis not present

## 2021-02-02 DIAGNOSIS — S5012XA Contusion of left forearm, initial encounter: Secondary | ICD-10-CM | POA: Diagnosis not present

## 2021-02-02 DIAGNOSIS — Z743 Need for continuous supervision: Secondary | ICD-10-CM | POA: Diagnosis not present

## 2021-02-02 DIAGNOSIS — M79632 Pain in left forearm: Secondary | ICD-10-CM | POA: Diagnosis not present

## 2021-02-02 DIAGNOSIS — S39012A Strain of muscle, fascia and tendon of lower back, initial encounter: Secondary | ICD-10-CM | POA: Diagnosis not present

## 2021-02-02 DIAGNOSIS — M25562 Pain in left knee: Secondary | ICD-10-CM | POA: Diagnosis not present

## 2021-02-04 ENCOUNTER — Encounter: Payer: Medicare Other | Attending: Physical Medicine & Rehabilitation | Admitting: Physical Medicine & Rehabilitation

## 2021-02-04 DIAGNOSIS — F32A Depression, unspecified: Secondary | ICD-10-CM | POA: Diagnosis not present

## 2021-02-04 DIAGNOSIS — E1159 Type 2 diabetes mellitus with other circulatory complications: Secondary | ICD-10-CM | POA: Diagnosis not present

## 2021-02-04 DIAGNOSIS — I152 Hypertension secondary to endocrine disorders: Secondary | ICD-10-CM | POA: Diagnosis not present

## 2021-02-04 DIAGNOSIS — G43909 Migraine, unspecified, not intractable, without status migrainosus: Secondary | ICD-10-CM | POA: Diagnosis not present

## 2021-02-12 DIAGNOSIS — J449 Chronic obstructive pulmonary disease, unspecified: Secondary | ICD-10-CM | POA: Diagnosis not present

## 2021-02-12 DIAGNOSIS — I152 Hypertension secondary to endocrine disorders: Secondary | ICD-10-CM | POA: Diagnosis not present

## 2021-02-12 DIAGNOSIS — E1159 Type 2 diabetes mellitus with other circulatory complications: Secondary | ICD-10-CM | POA: Diagnosis not present

## 2021-03-10 DIAGNOSIS — G4733 Obstructive sleep apnea (adult) (pediatric): Secondary | ICD-10-CM | POA: Diagnosis not present

## 2021-03-14 DIAGNOSIS — I152 Hypertension secondary to endocrine disorders: Secondary | ICD-10-CM | POA: Diagnosis not present

## 2021-03-14 DIAGNOSIS — J449 Chronic obstructive pulmonary disease, unspecified: Secondary | ICD-10-CM | POA: Diagnosis not present

## 2021-03-14 DIAGNOSIS — E1159 Type 2 diabetes mellitus with other circulatory complications: Secondary | ICD-10-CM | POA: Diagnosis not present

## 2021-03-17 DIAGNOSIS — M25562 Pain in left knee: Secondary | ICD-10-CM | POA: Diagnosis not present

## 2021-03-17 DIAGNOSIS — M1712 Unilateral primary osteoarthritis, left knee: Secondary | ICD-10-CM | POA: Diagnosis not present

## 2021-04-01 ENCOUNTER — Other Ambulatory Visit: Payer: Self-pay | Admitting: *Deleted

## 2021-04-01 DIAGNOSIS — R350 Frequency of micturition: Secondary | ICD-10-CM | POA: Diagnosis not present

## 2021-04-01 MED ORDER — TEMAZEPAM 30 MG PO CAPS: 30.0000 mg | ORAL_CAPSULE | Freq: Every evening | ORAL | 5 refills | Status: DC | PRN

## 2021-04-01 NOTE — Telephone Encounter (Signed)
Temazepam refilled. She needs to see [place] sonce a year to keep refilling meds> next month or so. Held spot ok.

## 2021-04-01 NOTE — Telephone Encounter (Signed)
Received faxed refill request from [facility]  Medication name/strength/dose: temazepam 30 mg Medication last rx'd: 10/17/2020 Quantity and number of refills last rx'd: #30 with 5 refills Instructions: take one tablet by mouth at bedtime  Last OV: 04/17/2020 Next OV: no pending appts  CY please advise on refill request  Allergies  Allergen Reactions   Butorphanol Hives   Metoclopramide Hives   Other Other (See Comments) and Swelling    Locks jaw Locks jaw   Amitriptyline Hcl Hives and Swelling   Cefazolin Hives and Swelling   Ceftriaxone Sodium Hives and Swelling   Ciprofloxacin Hives and Swelling   Darvocet [Propoxyphene N-Acetaminophen] Hives and Swelling   Penicillins Hives and Swelling   Prochlorperazine Edisylate Hives and Swelling   Promethazine Hcl     Jaw locks up   Sulfamethoxazole Swelling   Sulfonamide Derivatives Hives and Swelling   Sumatriptan Hives and Swelling   Current Outpatient Medications on File Prior to Visit  Medication Sig Dispense Refill   albuterol (VENTOLIN HFA) 108 (90 Base) MCG/ACT inhaler Inhale 2 puffs into the lungs every 4 (four) hours as needed. 34 g 1   aspirin EC 81 MG tablet Take 81 mg by mouth daily. Swallow whole.     budesonide-formoterol (SYMBICORT) 160-4.5 MCG/ACT inhaler Inhale 2 puffs into the lungs 2 (two) times daily.     cetirizine (ZYRTEC) 10 MG tablet Take 10 mg by mouth daily.     citalopram (CELEXA) 40 MG tablet Take 40 mg by mouth daily. Takes 1 tablet at bedtime.     EPINEPHrine 0.3 mg/0.3 mL IJ SOAJ injection Use as directed for life-threatening allergic reaction. 2 each 3   ibuprofen (ADVIL) 800 MG tablet Take 800 mg by mouth every 8 (eight) hours as needed for mild pain or moderate pain.     JARDIANCE 10 MG TABS tablet Take 10 mg by mouth daily.     lisinopril (ZESTRIL) 40 MG tablet Take 40 mg by mouth daily.     Melatonin 1 MG TABS Take 9 mg by mouth at bedtime.     metoprolol succinate (TOPROL-XL) 25 MG 24 hr  tablet Take 25 mg by mouth daily.     montelukast (SINGULAIR) 10 MG tablet Take 10 mg by mouth at bedtime.     rosuvastatin (CRESTOR) 20 MG tablet Take 20 mg by mouth at bedtime.     temazepam (RESTORIL) 30 MG capsule Take 1 capsule (30 mg total) by mouth at bedtime as needed for sleep. 30 capsule 5   No current facility-administered medications on file prior to visit.

## 2021-04-09 DIAGNOSIS — G4733 Obstructive sleep apnea (adult) (pediatric): Secondary | ICD-10-CM | POA: Diagnosis not present

## 2021-04-14 DIAGNOSIS — I152 Hypertension secondary to endocrine disorders: Secondary | ICD-10-CM | POA: Diagnosis not present

## 2021-04-14 DIAGNOSIS — E1159 Type 2 diabetes mellitus with other circulatory complications: Secondary | ICD-10-CM | POA: Diagnosis not present

## 2021-04-14 DIAGNOSIS — J449 Chronic obstructive pulmonary disease, unspecified: Secondary | ICD-10-CM | POA: Diagnosis not present

## 2021-04-16 DIAGNOSIS — M549 Dorsalgia, unspecified: Secondary | ICD-10-CM | POA: Diagnosis not present

## 2021-04-18 DIAGNOSIS — M779 Enthesopathy, unspecified: Secondary | ICD-10-CM | POA: Diagnosis not present

## 2021-04-18 DIAGNOSIS — Z882 Allergy status to sulfonamides status: Secondary | ICD-10-CM | POA: Diagnosis not present

## 2021-04-18 DIAGNOSIS — Z7982 Long term (current) use of aspirin: Secondary | ICD-10-CM | POA: Diagnosis not present

## 2021-04-18 DIAGNOSIS — S39012A Strain of muscle, fascia and tendon of lower back, initial encounter: Secondary | ICD-10-CM | POA: Diagnosis not present

## 2021-04-18 DIAGNOSIS — M5137 Other intervertebral disc degeneration, lumbosacral region: Secondary | ICD-10-CM | POA: Diagnosis not present

## 2021-04-18 DIAGNOSIS — J45909 Unspecified asthma, uncomplicated: Secondary | ICD-10-CM | POA: Diagnosis not present

## 2021-04-18 DIAGNOSIS — G8929 Other chronic pain: Secondary | ICD-10-CM | POA: Diagnosis not present

## 2021-04-18 DIAGNOSIS — Z79899 Other long term (current) drug therapy: Secondary | ICD-10-CM | POA: Diagnosis not present

## 2021-04-18 DIAGNOSIS — R519 Headache, unspecified: Secondary | ICD-10-CM | POA: Diagnosis not present

## 2021-04-18 DIAGNOSIS — Z7951 Long term (current) use of inhaled steroids: Secondary | ICD-10-CM | POA: Diagnosis not present

## 2021-04-18 DIAGNOSIS — Z880 Allergy status to penicillin: Secondary | ICD-10-CM | POA: Diagnosis not present

## 2021-04-18 DIAGNOSIS — E785 Hyperlipidemia, unspecified: Secondary | ICD-10-CM | POA: Diagnosis not present

## 2021-04-18 DIAGNOSIS — G4733 Obstructive sleep apnea (adult) (pediatric): Secondary | ICD-10-CM | POA: Diagnosis not present

## 2021-04-18 DIAGNOSIS — M47817 Spondylosis without myelopathy or radiculopathy, lumbosacral region: Secondary | ICD-10-CM | POA: Diagnosis not present

## 2021-04-18 DIAGNOSIS — M25562 Pain in left knee: Secondary | ICD-10-CM | POA: Diagnosis not present

## 2021-04-28 DIAGNOSIS — E1169 Type 2 diabetes mellitus with other specified complication: Secondary | ICD-10-CM | POA: Diagnosis not present

## 2021-05-06 DIAGNOSIS — E1159 Type 2 diabetes mellitus with other circulatory complications: Secondary | ICD-10-CM | POA: Diagnosis not present

## 2021-05-06 DIAGNOSIS — E1169 Type 2 diabetes mellitus with other specified complication: Secondary | ICD-10-CM | POA: Diagnosis not present

## 2021-05-06 DIAGNOSIS — E785 Hyperlipidemia, unspecified: Secondary | ICD-10-CM | POA: Diagnosis not present

## 2021-05-06 DIAGNOSIS — I152 Hypertension secondary to endocrine disorders: Secondary | ICD-10-CM | POA: Diagnosis not present

## 2021-05-09 DIAGNOSIS — M25562 Pain in left knee: Secondary | ICD-10-CM | POA: Diagnosis not present

## 2021-05-09 DIAGNOSIS — G8929 Other chronic pain: Secondary | ICD-10-CM | POA: Diagnosis not present

## 2021-05-14 DIAGNOSIS — J449 Chronic obstructive pulmonary disease, unspecified: Secondary | ICD-10-CM | POA: Diagnosis not present

## 2021-05-14 DIAGNOSIS — I152 Hypertension secondary to endocrine disorders: Secondary | ICD-10-CM | POA: Diagnosis not present

## 2021-05-14 DIAGNOSIS — E1159 Type 2 diabetes mellitus with other circulatory complications: Secondary | ICD-10-CM | POA: Diagnosis not present

## 2021-05-15 DIAGNOSIS — M79601 Pain in right arm: Secondary | ICD-10-CM | POA: Diagnosis not present

## 2021-05-15 DIAGNOSIS — Z043 Encounter for examination and observation following other accident: Secondary | ICD-10-CM | POA: Diagnosis not present

## 2021-05-15 DIAGNOSIS — R6889 Other general symptoms and signs: Secondary | ICD-10-CM | POA: Diagnosis not present

## 2021-05-15 DIAGNOSIS — I739 Peripheral vascular disease, unspecified: Secondary | ICD-10-CM | POA: Diagnosis not present

## 2021-05-15 DIAGNOSIS — R0902 Hypoxemia: Secondary | ICD-10-CM | POA: Diagnosis not present

## 2021-05-15 DIAGNOSIS — Z743 Need for continuous supervision: Secondary | ICD-10-CM | POA: Diagnosis not present

## 2021-05-15 DIAGNOSIS — G319 Degenerative disease of nervous system, unspecified: Secondary | ICD-10-CM | POA: Diagnosis not present

## 2021-05-15 DIAGNOSIS — W19XXXA Unspecified fall, initial encounter: Secondary | ICD-10-CM | POA: Diagnosis not present

## 2021-05-15 DIAGNOSIS — S0003XA Contusion of scalp, initial encounter: Secondary | ICD-10-CM | POA: Diagnosis not present

## 2021-06-14 DIAGNOSIS — I152 Hypertension secondary to endocrine disorders: Secondary | ICD-10-CM | POA: Diagnosis not present

## 2021-06-14 DIAGNOSIS — J449 Chronic obstructive pulmonary disease, unspecified: Secondary | ICD-10-CM | POA: Diagnosis not present

## 2021-06-14 DIAGNOSIS — E1159 Type 2 diabetes mellitus with other circulatory complications: Secondary | ICD-10-CM | POA: Diagnosis not present

## 2021-07-02 DIAGNOSIS — Z9889 Other specified postprocedural states: Secondary | ICD-10-CM | POA: Diagnosis not present

## 2021-07-02 DIAGNOSIS — M1712 Unilateral primary osteoarthritis, left knee: Secondary | ICD-10-CM | POA: Diagnosis not present

## 2021-07-08 DIAGNOSIS — G4733 Obstructive sleep apnea (adult) (pediatric): Secondary | ICD-10-CM | POA: Diagnosis not present

## 2021-07-10 ENCOUNTER — Ambulatory Visit: Payer: Medicare Other | Admitting: Cardiology

## 2021-07-10 DIAGNOSIS — J069 Acute upper respiratory infection, unspecified: Secondary | ICD-10-CM | POA: Diagnosis not present

## 2021-07-10 DIAGNOSIS — R197 Diarrhea, unspecified: Secondary | ICD-10-CM | POA: Diagnosis not present

## 2021-07-10 DIAGNOSIS — A084 Viral intestinal infection, unspecified: Secondary | ICD-10-CM | POA: Diagnosis not present

## 2021-07-10 DIAGNOSIS — R051 Acute cough: Secondary | ICD-10-CM | POA: Diagnosis not present

## 2021-07-10 DIAGNOSIS — I10 Essential (primary) hypertension: Secondary | ICD-10-CM | POA: Diagnosis not present

## 2021-07-10 DIAGNOSIS — R112 Nausea with vomiting, unspecified: Secondary | ICD-10-CM | POA: Diagnosis not present

## 2021-07-10 DIAGNOSIS — Z20822 Contact with and (suspected) exposure to covid-19: Secondary | ICD-10-CM | POA: Diagnosis not present

## 2021-07-11 DIAGNOSIS — I700 Atherosclerosis of aorta: Secondary | ICD-10-CM | POA: Diagnosis not present

## 2021-07-11 DIAGNOSIS — R197 Diarrhea, unspecified: Secondary | ICD-10-CM | POA: Diagnosis not present

## 2021-07-11 DIAGNOSIS — N390 Urinary tract infection, site not specified: Secondary | ICD-10-CM | POA: Diagnosis not present

## 2021-07-11 DIAGNOSIS — R112 Nausea with vomiting, unspecified: Secondary | ICD-10-CM | POA: Diagnosis not present

## 2021-07-11 DIAGNOSIS — R109 Unspecified abdominal pain: Secondary | ICD-10-CM | POA: Diagnosis not present

## 2021-07-15 DIAGNOSIS — E785 Hyperlipidemia, unspecified: Secondary | ICD-10-CM | POA: Diagnosis not present

## 2021-07-15 DIAGNOSIS — I152 Hypertension secondary to endocrine disorders: Secondary | ICD-10-CM | POA: Diagnosis not present

## 2021-07-15 DIAGNOSIS — E1159 Type 2 diabetes mellitus with other circulatory complications: Secondary | ICD-10-CM | POA: Diagnosis not present

## 2021-07-15 DIAGNOSIS — J449 Chronic obstructive pulmonary disease, unspecified: Secondary | ICD-10-CM | POA: Diagnosis not present

## 2021-07-21 ENCOUNTER — Ambulatory Visit (INDEPENDENT_AMBULATORY_CARE_PROVIDER_SITE_OTHER): Payer: Medicare Other | Admitting: Cardiology

## 2021-07-21 ENCOUNTER — Other Ambulatory Visit: Payer: Self-pay

## 2021-07-21 VITALS — BP 138/80 | HR 74 | Ht 63.0 in | Wt 275.8 lb

## 2021-07-21 DIAGNOSIS — R002 Palpitations: Secondary | ICD-10-CM

## 2021-07-21 DIAGNOSIS — R0609 Other forms of dyspnea: Secondary | ICD-10-CM | POA: Diagnosis not present

## 2021-07-21 DIAGNOSIS — I6930 Unspecified sequelae of cerebral infarction: Secondary | ICD-10-CM

## 2021-07-21 DIAGNOSIS — G4733 Obstructive sleep apnea (adult) (pediatric): Secondary | ICD-10-CM | POA: Diagnosis not present

## 2021-07-21 MED ORDER — METOPROLOL SUCCINATE ER 50 MG PO TB24: 50.0000 mg | ORAL_TABLET | Freq: Every day | ORAL | 3 refills | Status: AC

## 2021-07-21 NOTE — Patient Instructions (Signed)
Medication Instructions:  Your physician has recommended you make the following change in your medication:   START: Metoprolol succinate 50 mg daily  *If you need a refill on your cardiac medications before your next appointment, please call your pharmacy*   Lab Work: None If you have labs (blood work) drawn today and your tests are completely normal, you will receive your results only by: [place] (if you have MyChart) OR A paper copy in the mail If you have any lab test that is abnormal or we need to change your treatment, we will call you to review the results.   Testing/Procedures: Your physician has requested that you have an echocardiogram. Echocardiography is a painless test that uses sound waves to create images of your heart. It provides your doctor with information about the size and shape of your heart and how well your hearts chambers and valves are working. This procedure takes approximately one hour. There are no restrictions for this procedure.    Follow-Up: At [facility], you and your health needs are our priority.  As part of our continuing mission to provide you with exceptional heart care, we have created designated Provider Care Teams.  These Care Teams include your primary Cardiologist (physician) and Advanced Practice Providers (APPs -  Physician Assistants and Nurse Practitioners) who all work together to provide you with the care you need, when you need it.  We recommend signing up for the patient portal called "MyChart".  Sign up information is provided on this After Visit Summary.  MyChart is used to connect with patients for Virtual Visits (Telemedicine).  Patients are able to view lab/test results, encounter notes, upcoming appointments, etc.  Non-urgent messages can be sent to your provider as well.   To learn more about what you can do with MyChart, go to [email or web address].    Your next appointment:   6 month(s)  The format for your next  appointment:   In Person  Provider:   Jenne Campus, MD    Other Instructions None

## 2021-07-21 NOTE — Progress Notes (Signed)
Cardiology Office Note:    Date:  07/21/2021   ID:  Veronica Leblanc, DOB [birth year], MRN [id number]  PCP:  Lezlie Lye, Meda Coffee, MD  Cardiologist:  Gypsy Balsam, MD    Referring MD: Jerene Canny,*   Chief Complaint  Patient presents with   Follow-up  Still some palpitation  History of Present Illness:    Veronica Leblanc is a 61 y.o. female with past medical history significant for morbid obesity, obstructive sleep apnea, type 2 diabetes, dyslipidemia she was referred to [place] because of palpitations as well as dyspnea on exertion.  Cardiac work-up likely was negative.  She did wear some monitor which showed some PVCs and APCs.  She was put on beta-blocker which responds but relief from symptoms is not complete.  She still gets some episode of palpitation and skipped beats.  Complain of having fatigue tiredness she is trying to lose weight because she was told in order for her to have knee replacement surgery she has to lose about 50 pounds.  She dropped already 12 pounds which I congratulated her for.  Past Medical History:  Diagnosis Date   Acute, but ill-defined, cerebrovascular disease    Arthritis    Asthma    Asthma, mild intermittent 03/21/2019   Chronic pain of left knee 09/07/2018   CVA (cerebral vascular accident) [facility])    CVA (cerebral vascular accident) (HCC)    Age 60   Dyspnea on exertion 09/18/2019   Eczema    Endometriosis, site unspecified    History of migraine headaches    Insomnia 08/30/2019   Late effect of cerebrovascular accident (CVA) 09/18/2019   Nocturnal hypoxemia 05/10/2020   Obstructive sleep apnea 09/09/2010   PSG from 05/06/10>>AHI 9.6, SpO2 low 77% from [place].   CPAP titration 07/16/10>>CPAP 8 cm from [place].    OSA (obstructive sleep apnea)    Palpitations 09/18/2019   S/P left knee arthroscopy 05/08/2019   Type 2 diabetes mellitus without complication, without long-term current use of insulin (HCC) 09/18/2019   Urticaria      Past Surgical History:  Procedure Laterality Date   APPENDECTOMY     CESAREAN SECTION     CHOLECYSTECTOMY     ganglionectomy     c2/c3 right and left side   NECK SURGERY     5 times   presacral neurectomy     SALPINGOOPHORECTOMY     left   TOTAL ABDOMINAL HYSTERECTOMY      Current Medications: Current Meds  Medication Sig   albuterol (VENTOLIN HFA) 108 (90 Base) MCG/ACT inhaler Inhale 2 puffs into the lungs every 4 (four) hours as needed.   aspirin EC 81 MG tablet Take 81 mg by mouth daily. Swallow whole.   budesonide-formoterol (SYMBICORT) 160-4.5 MCG/ACT inhaler Inhale 2 puffs into the lungs 2 (two) times daily.   cetirizine (ZYRTEC) 10 MG tablet Take 10 mg by mouth daily.   citalopram (CELEXA) 40 MG tablet Take 40 mg by mouth daily. Takes 1 tablet at bedtime.   EPINEPHrine 0.3 mg/0.3 mL IJ SOAJ injection Use as directed for life-threatening allergic reaction. (Patient taking differently: Inject 0.3 mg into the muscle as needed for anaphylaxis. Use as directed for life-threatening allergic reaction.)   ibuprofen (ADVIL) 800 MG tablet Take 800 mg by mouth every 8 (eight) hours as needed for mild pain or moderate pain.   JARDIANCE 10 MG TABS tablet Take 10 mg by mouth daily.   lisinopril (ZESTRIL) 40 MG tablet Take 40 mg by  mouth daily.   Melatonin 1 MG TABS Take 9 mg by mouth at bedtime.   metoprolol succinate (TOPROL-XL) 25 MG 24 hr tablet Take 25 mg by mouth daily.   montelukast (SINGULAIR) 10 MG tablet Take 10 mg by mouth at bedtime.   rosuvastatin (CRESTOR) 20 MG tablet Take 20 mg by mouth at bedtime.   temazepam (RESTORIL) 30 MG capsule Take 1 capsule (30 mg total) by mouth at bedtime as needed for sleep.     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  Tobacco Use   Smoking status: Never   Smokeless tobacco: Never  Substance and Sexual Activity   Alcohol use: Not on file   Drug use: Not on file   Sexual activity: Not on file  Other Topics Concern   Not on file  Social History Narrative   Not on file   Social Determinants of Health   Financial Resource Strain: Not on file  Food Insecurity: Not on file  Transportation Needs: Not on file  Physical Activity: Not on file  Stress: Not on file  Social Connections: Not on file     Family History: The patient's family history includes Breast cancer in her maternal aunt; Colon cancer in her cousin; Diabetes in her brother and father; Heart disease in her maternal grandfather and paternal grandfather; Hypertension in her brother, father, and mother; Pancreatic cancer in her maternal grandmother. ROS:   Please see the history of present illness.    All 14 point review of systems negative except as described per history of present illness  EKGs/Labs/Other Studies Reviewed:      Recent Labs: No results found for requested labs within last 8760 hours.  Recent Lipid Panel No results found for: CHOL, TRIG, HDL, CHOLHDL, VLDL, LDLCALC, LDLDIRECT  Physical Exam:    VS:  BP 138/80 (BP Location: Left Arm)    Pulse 74    Ht 5\' 3"  (1.6 m)    Wt 275 lb 12.8 oz (125.1 kg)    LMP  (LMP Unknown)    SpO2 95%    BMI 48.86 kg/m     Wt Readings from Last 3 Encounters:  07/21/21 275 lb 12.8 oz (125.1 kg)  10/14/20 277 lb 3.2 oz (125.7 kg)  09/29/20 282 lb (127.9 kg)     GEN:  Well nourished, well developed in no acute distress HEENT: Normal NECK: No JVD; No carotid bruits LYMPHATICS:  No lymphadenopathy CARDIAC: RRR, no murmurs, no rubs, no gallops RESPIRATORY:  Clear to auscultation without rales, wheezing or rhonchi  ABDOMEN: Soft, non-tender, non-distended MUSCULOSKELETAL:  No edema; No deformity  SKIN: Warm and dry LOWER EXTREMITIES: no swelling NEUROLOGIC:  Alert and oriented x 3 PSYCHIATRIC:  Normal affect   ASSESSMENT:    1. Palpitations   2. Late effect of cerebrovascular accident (CVA)   3. Dyspnea on exertion   4. Obstructive sleep apnea    PLAN:    In order of problems listed above:  Palpitation ask her to increase dose of metoprolol succinate from  50 mg daily to 75 mg daily. Late effect of CVA.  That happened many years ago.  He is on antiplatelet therapy which I will continue. Dyspnea exertion.  I will ask her to have an echocardiogram repeated to recheck left ventricle ejection fraction. Dyslipidemia she is on Crestor which I will continue, I did review her K PN which show me total cholesterol 129 HDL 36 LDL not available this is from November 2022.   Medication Adjustments/Labs and Tests Ordered: Current medicines are reviewed at length with the patient today.  Concerns regarding medicines are outlined above.  No orders of the defined types were placed in this encounter.  Medication changes: No orders of the defined types were placed in this encounter.   Signed, Georgeanna Lea, MD, [facility] 07/21/2021 2:09 PM    [facility]

## 2021-07-28 ENCOUNTER — Other Ambulatory Visit: Payer: Medicare Other

## 2021-07-31 ENCOUNTER — Other Ambulatory Visit: Payer: Self-pay

## 2021-07-31 ENCOUNTER — Ambulatory Visit (INDEPENDENT_AMBULATORY_CARE_PROVIDER_SITE_OTHER): Payer: Medicare Other

## 2021-07-31 DIAGNOSIS — G4733 Obstructive sleep apnea (adult) (pediatric): Secondary | ICD-10-CM

## 2021-07-31 DIAGNOSIS — R0609 Other forms of dyspnea: Secondary | ICD-10-CM

## 2021-07-31 DIAGNOSIS — R002 Palpitations: Secondary | ICD-10-CM | POA: Diagnosis not present

## 2021-07-31 DIAGNOSIS — I6930 Unspecified sequelae of cerebral infarction: Secondary | ICD-10-CM | POA: Diagnosis not present

## 2021-07-31 LAB — ECHOCARDIOGRAM COMPLETE
Area-P 1/2: 2.83 cm2
S' Lateral: 3 cm

## 2021-08-04 ENCOUNTER — Telehealth: Payer: Self-pay

## 2021-08-04 NOTE — Telephone Encounter (Signed)
Patient notified of results.

## 2021-08-04 NOTE — Telephone Encounter (Signed)
-----   Message from Georgeanna Lea, MD sent at 08/04/2021 10:47 AM EST ----- Echocardiogram showed low normal ejection fraction, moderate LVH, overall looks good

## 2021-08-12 DIAGNOSIS — E785 Hyperlipidemia, unspecified: Secondary | ICD-10-CM | POA: Diagnosis not present

## 2021-08-12 DIAGNOSIS — E1159 Type 2 diabetes mellitus with other circulatory complications: Secondary | ICD-10-CM | POA: Diagnosis not present

## 2021-08-12 DIAGNOSIS — J449 Chronic obstructive pulmonary disease, unspecified: Secondary | ICD-10-CM | POA: Diagnosis not present

## 2021-08-12 DIAGNOSIS — I152 Hypertension secondary to endocrine disorders: Secondary | ICD-10-CM | POA: Diagnosis not present

## 2021-08-13 DIAGNOSIS — E1169 Type 2 diabetes mellitus with other specified complication: Secondary | ICD-10-CM | POA: Diagnosis not present

## 2021-08-27 DIAGNOSIS — R000 Tachycardia, unspecified: Secondary | ICD-10-CM | POA: Diagnosis not present

## 2021-08-27 DIAGNOSIS — E785 Hyperlipidemia, unspecified: Secondary | ICD-10-CM | POA: Diagnosis not present

## 2021-08-27 DIAGNOSIS — E1169 Type 2 diabetes mellitus with other specified complication: Secondary | ICD-10-CM | POA: Diagnosis not present

## 2021-08-27 DIAGNOSIS — I10 Essential (primary) hypertension: Secondary | ICD-10-CM | POA: Diagnosis not present

## 2021-09-10 DIAGNOSIS — I10 Essential (primary) hypertension: Secondary | ICD-10-CM | POA: Diagnosis not present

## 2021-09-10 DIAGNOSIS — E119 Type 2 diabetes mellitus without complications: Secondary | ICD-10-CM | POA: Diagnosis not present

## 2021-09-10 DIAGNOSIS — Z713 Dietary counseling and surveillance: Secondary | ICD-10-CM | POA: Diagnosis not present

## 2021-09-10 DIAGNOSIS — E039 Hypothyroidism, unspecified: Secondary | ICD-10-CM | POA: Diagnosis not present

## 2021-09-12 DIAGNOSIS — J449 Chronic obstructive pulmonary disease, unspecified: Secondary | ICD-10-CM | POA: Diagnosis not present

## 2021-09-17 DIAGNOSIS — Z713 Dietary counseling and surveillance: Secondary | ICD-10-CM | POA: Diagnosis not present

## 2021-09-17 DIAGNOSIS — E1169 Type 2 diabetes mellitus with other specified complication: Secondary | ICD-10-CM | POA: Diagnosis not present

## 2021-09-29 DIAGNOSIS — T23262A Burn of second degree of back of left hand, initial encounter: Secondary | ICD-10-CM | POA: Diagnosis not present

## 2021-09-29 DIAGNOSIS — Z23 Encounter for immunization: Secondary | ICD-10-CM | POA: Diagnosis not present

## 2021-09-29 DIAGNOSIS — S61402A Unspecified open wound of left hand, initial encounter: Secondary | ICD-10-CM | POA: Diagnosis not present

## 2021-10-03 DIAGNOSIS — T23262D Burn of second degree of back of left hand, subsequent encounter: Secondary | ICD-10-CM | POA: Diagnosis not present

## 2021-10-03 DIAGNOSIS — T23212D Burn of second degree of left thumb (nail), subsequent encounter: Secondary | ICD-10-CM | POA: Diagnosis not present

## 2021-10-06 DIAGNOSIS — Z0189 Encounter for other specified special examinations: Secondary | ICD-10-CM | POA: Diagnosis not present

## 2021-10-06 DIAGNOSIS — Z136 Encounter for screening for cardiovascular disorders: Secondary | ICD-10-CM | POA: Diagnosis not present

## 2021-10-06 DIAGNOSIS — Z0000 Encounter for general adult medical examination without abnormal findings: Secondary | ICD-10-CM | POA: Diagnosis not present

## 2021-10-06 DIAGNOSIS — Z139 Encounter for screening, unspecified: Secondary | ICD-10-CM | POA: Diagnosis not present

## 2021-10-07 ENCOUNTER — Telehealth: Payer: Self-pay | Admitting: Internal Medicine

## 2021-10-07 DIAGNOSIS — G4733 Obstructive sleep apnea (adult) (pediatric): Secondary | ICD-10-CM | POA: Diagnosis not present

## 2021-10-07 NOTE — Progress Notes (Deleted)
HPI F never smoker followed for OSA, Insomnia, complicated by CVA, OSA, Endometriosis, Asthma, Allergic Rhinitis, HBP,  NPSG [place] 05/05/10- AHI 9.6/ hr, RDI 13.2/ hr, desaturation to 77%.  ------------------------------------------------------------------------------------------------------   04/17/20- 59 yoF never smoker followed for OSA, Insomnia, Asthma, , complicated by CVA, OSA, Endometriosis,  Allergic Rhinitis, HBP, DM2,   Singulair, melatonin, Vistaril, Zyrtec, Symbicort 160, albuterol hfa CPAP  Auto 5-15/ Home O2 sleep/ [employer] Download- compliance 90%, AHI 0.8/ hr Body weight today- 268 lbs Covid vax- none Flu vax- declines -----pt is here for cpap compliance and temazepam dosagenot working well Doing well with CPAP and full-face mask is good. Discussed insomnia- can try increasing temazepam to 30 mg. Discussed oxygen- trying to do without it.  Long discussion of Covid vaccine.   10/09/21- 61 yoF never smoker followed for OSA, Insomnia, Asthma, , complicated by CVA, OSA, Endometriosis,  Allergic Rhinitis, HBP, DM2,   Singulair, melatonin, Vistaril, Zyrtec, Symbicort 160, albuterol hfa CPAP  Auto 5-15/ Home O2 sleep/ [employer] Download- compliance  Body weight today-  Covid vax- none Flu vax- declines   ROS-see HPI   + = positive Constitutional:    weight loss, night sweats, fevers, chills, +fatigue, lassitude. HEENT:    +headaches, difficulty swallowing, tooth/dental problems, sore throat,       +sneezing, itching, ear ache, +nasal congestion, post nasal drip, snoring CV:    chest pain, orthopnea, PND, +swelling in lower extremities, anasarca,                                   dizziness, palpitations Resp:   shortness of breath with exertion or at rest.                +productive cough,   +non-productive cough, coughing up of blood.              change in color of mucus.  +wheezing.   Skin:    rash or lesions. GI:  No-   heartburn, indigestion, abdominal pain,  nausea, vomiting, diarrhea,                 change in bowel habits, loss of appetite GU: dysuria, change in color of urine, no urgency or frequency.   flank pain. MS:   joint pain, stiffness, decreased range of motion, back pain. Neuro-     nothing unusual Psych:  change in mood or affect.  depression or anxiety.   memory loss.  OBJ- Physical Exam General- Alert, Oriented, Affect-appropriate, Distress- none acute, + obese Skin- rash-none, lesions- none, excoriation- none Lymphadenopathy- none Head- atraumatic            Eyes- Gross vision intact, PERRLA, conjunctivae and secretions clear            Ears- Hearing, canals-normal            Nose- Clear, no-Septal dev, mucus, polyps, erosion, perforation             Throat- Mallampati III , mucosa clear , drainage- none, tonsils- atrophic,  + many missing teeth Neck- flexible , trachea midline, no stridor , thyroid nl, carotid no bruit Chest - symmetrical excursion , unlabored           Heart/CV- RRR , no murmur , no gallop  , no rub, nl s1 s2                           -  JVD- none , edema- none, stasis changes- none, varices- none           Lung- clear to P&A, wheeze- none, cough- none , dullness-none, rub- none           Chest wall-  Abd-  Br/ Gen/ Rectal- Not done, not indicated Extrem- cyanosis- none, clubbing, none, atrophy- none, strength- nl Neuro- grossly intact to observation

## 2021-10-07 NOTE — Telephone Encounter (Signed)
Pt must be seen first prior to [place] being able to refill her Temazepam due to pt not being seen at the office since 2021. ? ?Called and spoke with pt letting her know this info and that we would take care of refilling med at OV. Pt verbalized understanding. Nothing further needed. ?

## 2021-10-08 DIAGNOSIS — M1712 Unilateral primary osteoarthritis, left knee: Secondary | ICD-10-CM | POA: Diagnosis not present

## 2021-10-09 ENCOUNTER — Ambulatory Visit: Payer: Medicare Other | Admitting: Internal Medicine

## 2021-10-12 DIAGNOSIS — J449 Chronic obstructive pulmonary disease, unspecified: Secondary | ICD-10-CM | POA: Diagnosis not present

## 2021-10-12 DIAGNOSIS — E785 Hyperlipidemia, unspecified: Secondary | ICD-10-CM | POA: Diagnosis not present

## 2021-10-12 DIAGNOSIS — E1169 Type 2 diabetes mellitus with other specified complication: Secondary | ICD-10-CM | POA: Diagnosis not present

## 2021-10-15 ENCOUNTER — Other Ambulatory Visit: Payer: Self-pay | Admitting: Internal Medicine

## 2021-10-15 MED ORDER — TEMAZEPAM 30 MG PO CAPS: 30.0000 mg | ORAL_CAPSULE | Freq: Every evening | ORAL | 5 refills | Status: DC | PRN

## 2021-10-15 NOTE — Telephone Encounter (Signed)
Temazepam refilled. Please keep f/u visit 5/9 ?

## 2021-10-15 NOTE — Telephone Encounter (Signed)
Temazepam refill sent to [facility] ?

## 2021-10-15 NOTE — Addendum Note (Signed)
Addended by: Jetty Duhamel D on: 10/15/2021 02:21 PM ? ? Modules accepted: Orders ? ?

## 2021-10-17 NOTE — Progress Notes (Deleted)
HPI F never smoker followed for OSA, Insomnia, complicated by CVA, OSA, Endometriosis, Asthma, Allergic Rhinitis, HBP,  NPSG [facility] 05/05/10- AHI 9.6/ hr, RDI 13.2/ hr, desaturation to 77%.  -----------------------------------------------------------------------------------------------------------------------------------------   04/17/20- 59 yoF never smoker followed for OSA, Insomnia, Asthma, , complicated by CVA, OSA, Endometriosis,  Allergic Rhinitis, HBP, DM2,   Singulair, melatonin, Vistaril, Zyrtec, Symbicort 160, albuterol hfa CPAP  Auto 5-15/ Home O2 sleep/ [employer] Download- compliance 90%, AHI 0.8/ hr Body weight today- 268 lbs Covid vax- none Flu vax- declines -----pt is here for cpap compliance and temazepam dosagenot working well Doing well with CPAP and full-face mask is good. Discussed insomnia- can try increasing temazepam to 30 mg. Discussed oxygen- trying to do without it.  Long discussion of Covid vaccine.   10/20/21-  39 yoF never smoker followed for OSA, Insomnia, Asthma, , complicated by CVA, OSA, Endometriosis,  Allergic Rhinitis, HBP, DM2,   -Singulair, melatonin, Vistaril, Zyrtec, Symbicort 160, albuterol hfa CPAP  Auto 5-15/ Home O2 sleep/ [employer] Download- compliance  Body weight today- Covid vax- none Flu vax-    ROS-see HPI   + = positive Constitutional:    weight loss, night sweats, fevers, chills, +fatigue, lassitude. HEENT:    +headaches, difficulty swallowing, tooth/dental problems, sore throat,       +sneezing, itching, ear ache, +nasal congestion, post nasal drip, snoring CV:    chest pain, orthopnea, PND, +swelling in lower extremities, anasarca,                                   dizziness, palpitations Resp:   shortness of breath with exertion or at rest.                +productive cough,   +non-productive cough, coughing up of blood.              change in color of mucus.  +wheezing.   Skin:    rash or lesions. GI:  No-   heartburn,  indigestion, abdominal pain, nausea, vomiting, diarrhea,                 change in bowel habits, loss of appetite GU: dysuria, change in color of urine, no urgency or frequency.   flank pain. MS:   joint pain, stiffness, decreased range of motion, back pain. Neuro-     nothing unusual Psych:  change in mood or affect.  depression or anxiety.   memory loss.  OBJ- Physical Exam General- Alert, Oriented, Affect-appropriate, Distress- none acute, + obese Skin- rash-none, lesions- none, excoriation- none Lymphadenopathy- none Head- atraumatic            Eyes- Gross vision intact, PERRLA, conjunctivae and secretions clear            Ears- Hearing, canals-normal            Nose- Clear, no-Septal dev, mucus, polyps, erosion, perforation             Throat- Mallampati III , mucosa clear , drainage- none, tonsils- atrophic,  + many missing teeth Neck- flexible , trachea midline, no stridor , thyroid nl, carotid no bruit Chest - symmetrical excursion , unlabored           Heart/CV- RRR , no murmur , no gallop  , no rub, nl s1 s2                           -  JVD- none , edema- none, stasis changes- none, varices- none           Lung- clear to P&A, wheeze- none, cough- none , dullness-none, rub- none           Chest wall-  Abd-  Br/ Gen/ Rectal- Not done, not indicated Extrem- cyanosis- none, clubbing, none, atrophy- none, strength- nl Neuro- grossly intact to observation

## 2021-10-20 ENCOUNTER — Ambulatory Visit: Payer: Medicare Other | Admitting: Internal Medicine

## 2021-10-21 ENCOUNTER — Encounter: Payer: Self-pay | Admitting: Internal Medicine

## 2021-10-24 NOTE — Progress Notes (Signed)
HPI ?F never smoker followed for OSA, Insomnia, complicated by CVA, OSA, Endometriosis, Asthma, Allergic Rhinitis, HBP,  ?NPSG [facility] 05/05/10- AHI 9.6/ hr, RDI 13.2/ hr, desaturation to 77%. ? ?----------------------------------------------------------------------------------------------------------------------------------------- ? ? ?04/17/20- 59 yoF never smoker followed for OSA, Insomnia, Asthma, , complicated by CVA, OSA, Endometriosis,  Allergic Rhinitis, HBP, DM2,   ?Singulair, melatonin, Vistaril, Zyrtec, Symbicort 160, albuterol hfa ?CPAP  Auto 5-15/ Home O2 sleep/ [employer] ?Download- compliance 90%, AHI 0.8/ hr ?Body weight today- 268 lbs ?Covid vax- none ?Flu vax- declines ?-----pt is here for cpap compliance and temazepam dosagenot working well ?Doing well with CPAP and full-face mask is good. ?Discussed insomnia- can try increasing temazepam to 30 mg. ?Discussed oxygen- trying to do without it.  ?Long discussion of Covid vaccine.  ? ?10/26/21-  61 yoF never smoker followed for OSA, Insomnia, Asthma,  complicated by CVA, OSA, Endometriosis,  Allergic Rhinitis, HBP, DM2,   ?-Singulair, melatonin, Vistaril, Zyrtec, Symbicort 160, albuterol hfa, Temazepam, ?CPAP  Auto 5-15/ Home O2 sleep/ [employer] ?Download- compliance 67%, AHI 0.4/ hr     Short nights ?Body weight today-269 lbs ?Covid vax- 3 Moderna ?Flu vax- no ?-----Patients daughter states that she stops breathing more at night when sleeping. Patient states that she wears her CPAP but not as much as she should but she knocks it off while sleeping.  ?She and her daughter are not clear themselves about whether she is pulling CPAP mask off in her sleep or simply dislodging it.  They do not describe complex parasomnias but she can get restless.  Questions if temazepam works well enough and I suggested we try Lunesta.  She is interested in trying different CPAP mask so we will arrange mask fitting.  Download reviewed with very good control when  used. ? ?ROS-see HPI   + = positive ?Constitutional:    weight loss, night sweats, fevers, chills, +fatigue, lassitude. ?HEENT:    +headaches, difficulty swallowing, tooth/dental problems, sore throat,  ?     +sneezing, itching, ear ache, +nasal congestion, post nasal drip, snoring ?CV:    chest pain, orthopnea, PND, +swelling in lower extremities, anasarca,                                   ?dizziness, palpitations ?Resp:   shortness of breath with exertion or at rest.   ?             +productive cough,   +non-productive cough, coughing up of blood.   ?           change in color of mucus.  +wheezing.   ?Skin:    rash or lesions. ?GI:  No-   heartburn, indigestion, abdominal pain, nausea, vomiting, diarrhea,  ?               change in bowel habits, loss of appetite ?GU: dysuria, change in color of urine, no urgency or frequency.   flank pain. ?MS:   joint pain, stiffness, decreased range of motion, back pain. ?Neuro-     nothing unusual ?Psych:  change in mood or affect.  depression or anxiety.   memory loss. ? ?OBJ- Physical Exam ?General- Alert, Oriented, Affect-appropriate, Distress- none acute, + obese ?Skin- rash-none, lesions- none, excoriation- none ?Lymphadenopathy- none ?Head- atraumatic ?           Eyes- Gross vision intact, PERRLA, conjunctivae and secretions clear ?  Ears- Hearing, canals-normal ?           Nose- Clear, no-Septal dev, mucus, polyps, erosion, perforation  ?           Throat- Mallampati III , mucosa clear , drainage- none, tonsils- atrophic,  ?+ many missing teeth ?Neck- flexible , trachea midline, no stridor , thyroid nl, carotid no bruit ?Chest - symmetrical excursion , unlabored ?          Heart/CV- RRR , no murmur , no gallop  , no rub, nl s1 s2 ?                          - JVD- none , edema- none, stasis changes- none, varices- none ?          Lung- clear to P&A, wheeze- none, cough- none , dullness-none, rub- none ?          Chest wall-  ?Abd-  ?Br/ Gen/ Rectal- Not done,  not indicated ?Extrem- cyanosis- none, clubbing, none, atrophy- none, strength- nl ?Neuro- grossly intact to observation ? ? ?

## 2021-10-26 ENCOUNTER — Encounter: Payer: Self-pay | Admitting: Internal Medicine

## 2021-10-26 ENCOUNTER — Ambulatory Visit (INDEPENDENT_AMBULATORY_CARE_PROVIDER_SITE_OTHER): Payer: Medicare Other | Admitting: Internal Medicine

## 2021-10-26 VITALS — BP 120/62 | HR 73 | Temp 98.0°F | Ht 63.0 in | Wt 269.6 lb

## 2021-10-26 DIAGNOSIS — F5101 Primary insomnia: Secondary | ICD-10-CM

## 2021-10-26 DIAGNOSIS — G4733 Obstructive sleep apnea (adult) (pediatric): Secondary | ICD-10-CM | POA: Diagnosis not present

## 2021-10-26 DIAGNOSIS — J4520 Mild intermittent asthma, uncomplicated: Secondary | ICD-10-CM | POA: Diagnosis not present

## 2021-10-26 MED ORDER — ESZOPICLONE 2 MG PO TABS: ORAL_TABLET | ORAL | 2 refills | Status: DC

## 2021-10-26 MED ORDER — BREZTRI AEROSPHERE 160-9-4.8 MCG/ACT IN AERO: 2.0000 | INHALATION_SPRAY | Freq: Two times a day (BID) | RESPIRATORY_TRACT | 0 refills | Status: DC

## 2021-10-26 NOTE — Patient Instructions (Signed)
Order- referral for mask fitting ? ?We can continue CPAP auto 5-15 ? ?Order- sample x 2 Breztri inhaler    inhale 2 puffs then rinse mouth, twice daily ?Try this instead of Symbicort. If it works better, let us know. ? ?Script sent to try lunesta for sleep instead of temazepam ?

## 2021-10-26 NOTE — Assessment & Plan Note (Signed)
We are replacing temazepam with Lunesta 2 mg seeking better sleep consolidation. ?

## 2021-10-26 NOTE — Assessment & Plan Note (Signed)
Benefits from CPAP when she can keep it on. ?Plan-continue AutoPap 5-15.  Mask fitting.  Try replacing temazepam with Lunesta as discussed. ?

## 2021-11-12 ENCOUNTER — Other Ambulatory Visit: Payer: Self-pay | Admitting: Family Medicine

## 2021-11-12 DIAGNOSIS — E1129 Type 2 diabetes mellitus with other diabetic kidney complication: Secondary | ICD-10-CM | POA: Diagnosis not present

## 2021-11-12 DIAGNOSIS — I10 Essential (primary) hypertension: Secondary | ICD-10-CM | POA: Diagnosis not present

## 2021-11-12 DIAGNOSIS — Z1231 Encounter for screening mammogram for malignant neoplasm of breast: Secondary | ICD-10-CM

## 2021-11-12 DIAGNOSIS — J449 Chronic obstructive pulmonary disease, unspecified: Secondary | ICD-10-CM | POA: Diagnosis not present

## 2021-11-19 DIAGNOSIS — T310 Burns involving less than 10% of body surface: Secondary | ICD-10-CM | POA: Diagnosis not present

## 2021-11-19 DIAGNOSIS — T23201A Burn of second degree of right hand, unspecified site, initial encounter: Secondary | ICD-10-CM | POA: Diagnosis not present

## 2021-11-19 DIAGNOSIS — T23262A Burn of second degree of back of left hand, initial encounter: Secondary | ICD-10-CM | POA: Diagnosis not present

## 2021-11-30 ENCOUNTER — Inpatient Hospital Stay: Admission: RE | Admit: 2021-11-30 | Payer: Medicare Other | Source: Ambulatory Visit

## 2021-12-03 DIAGNOSIS — E1169 Type 2 diabetes mellitus with other specified complication: Secondary | ICD-10-CM | POA: Diagnosis not present

## 2021-12-10 DIAGNOSIS — I674 Hypertensive encephalopathy: Secondary | ICD-10-CM | POA: Diagnosis not present

## 2021-12-10 DIAGNOSIS — E1169 Type 2 diabetes mellitus with other specified complication: Secondary | ICD-10-CM | POA: Diagnosis not present

## 2021-12-10 DIAGNOSIS — G43109 Migraine with aura, not intractable, without status migrainosus: Secondary | ICD-10-CM | POA: Diagnosis not present

## 2021-12-10 DIAGNOSIS — E785 Hyperlipidemia, unspecified: Secondary | ICD-10-CM | POA: Diagnosis not present

## 2021-12-12 DIAGNOSIS — I10 Essential (primary) hypertension: Secondary | ICD-10-CM | POA: Diagnosis not present

## 2021-12-12 DIAGNOSIS — E1129 Type 2 diabetes mellitus with other diabetic kidney complication: Secondary | ICD-10-CM | POA: Diagnosis not present

## 2021-12-12 DIAGNOSIS — J449 Chronic obstructive pulmonary disease, unspecified: Secondary | ICD-10-CM | POA: Diagnosis not present

## 2021-12-22 ENCOUNTER — Other Ambulatory Visit (HOSPITAL_BASED_OUTPATIENT_CLINIC_OR_DEPARTMENT_OTHER): Payer: Medicare Other | Admitting: Internal Medicine

## 2021-12-31 DIAGNOSIS — Z0100 Encounter for examination of eyes and vision without abnormal findings: Secondary | ICD-10-CM | POA: Diagnosis not present

## 2021-12-31 DIAGNOSIS — H524 Presbyopia: Secondary | ICD-10-CM | POA: Diagnosis not present

## 2021-12-31 DIAGNOSIS — H5213 Myopia, bilateral: Secondary | ICD-10-CM | POA: Diagnosis not present

## 2022-01-01 DIAGNOSIS — G4733 Obstructive sleep apnea (adult) (pediatric): Secondary | ICD-10-CM | POA: Diagnosis not present

## 2022-01-07 DIAGNOSIS — G8929 Other chronic pain: Secondary | ICD-10-CM | POA: Diagnosis not present

## 2022-01-07 DIAGNOSIS — M25562 Pain in left knee: Secondary | ICD-10-CM | POA: Diagnosis not present

## 2022-01-07 DIAGNOSIS — G43109 Migraine with aura, not intractable, without status migrainosus: Secondary | ICD-10-CM | POA: Diagnosis not present

## 2022-01-12 DIAGNOSIS — E1129 Type 2 diabetes mellitus with other diabetic kidney complication: Secondary | ICD-10-CM | POA: Diagnosis not present

## 2022-01-12 DIAGNOSIS — E785 Hyperlipidemia, unspecified: Secondary | ICD-10-CM | POA: Diagnosis not present

## 2022-01-12 DIAGNOSIS — I10 Essential (primary) hypertension: Secondary | ICD-10-CM | POA: Diagnosis not present

## 2022-01-12 DIAGNOSIS — J449 Chronic obstructive pulmonary disease, unspecified: Secondary | ICD-10-CM | POA: Diagnosis not present

## 2022-01-13 DIAGNOSIS — H524 Presbyopia: Secondary | ICD-10-CM | POA: Diagnosis not present

## 2022-01-19 ENCOUNTER — Ambulatory Visit: Payer: Medicare Other | Admitting: Cardiology

## 2022-02-02 ENCOUNTER — Ambulatory Visit (INDEPENDENT_AMBULATORY_CARE_PROVIDER_SITE_OTHER): Payer: Medicare Other | Admitting: Cardiology

## 2022-02-02 ENCOUNTER — Encounter: Payer: Self-pay | Admitting: Cardiology

## 2022-02-02 VITALS — BP 144/74 | HR 91 | Ht 63.0 in | Wt 259.6 lb

## 2022-02-02 DIAGNOSIS — R002 Palpitations: Secondary | ICD-10-CM | POA: Diagnosis not present

## 2022-02-02 DIAGNOSIS — I6930 Unspecified sequelae of cerebral infarction: Secondary | ICD-10-CM

## 2022-02-02 DIAGNOSIS — R0609 Other forms of dyspnea: Secondary | ICD-10-CM

## 2022-02-02 NOTE — Progress Notes (Unsigned)
Cardiology Office Note:    Date:  02/02/2022   ID:  Veronica Leblanc, DOB 1961-04-29, MRN [id number]  PCP:  Lezlie Lye, Meda Coffee, MD  Cardiologist:  Gypsy Balsam, MD    Referring MD: Lezlie Lye, Meda Coffee, *   Chief Complaint  Patient presents with   Follow-up  Fine  History of Present Illness:    Veronica Leblanc is a 61 y.o. female with past medical history significant for morbid obesity, obstructive sleep apnea, type 2 diabetes, dyslipidemia.  Initial reason for referral was palpitations.  She also had dyspnea on exertion, cardiac work-up likely was negative.  She did wore monitor which shows some PVCs and APCs.  She was put on beta-blocker with good relief.  She comes today to my office for follow-up she is very happy because she lost 45 pounds.  She is getting ready to have her knee replacement surgery but first she have to have a visit with orthopedic doctor.  Denies have any chest pain tightness squeezing pressure burning chest, she did have steroid injection to the knee which helped significantly she said with that she would not be able to walk at all.  Past Medical History:  Diagnosis Date   Acute, but ill-defined, cerebrovascular disease    Arthritis    Asthma    Asthma, mild intermittent 03/21/2019   Chronic pain of left knee 09/07/2018   [facility])    CVA (cerebral vascular accident) (HCC)    Age 61   Dyspnea on exertion 09/18/2019   Eczema    Endometriosis, site unspecified    History of migraine headaches    Insomnia 08/30/2019   Late effect of cerebrovascular accident (CVA) 09/18/2019   Nocturnal hypoxemia 05/10/2020   Obstructive sleep apnea 09/09/2010   PSG from 05/06/10>>AHI 9.6, SpO2 low 77% from [place].   CPAP titration 07/16/10>>CPAP 8 cm from [place].    OSA (obstructive sleep apnea)    Palpitations 09/18/2019   S/P left knee arthroscopy 05/08/2019   Type 2 diabetes mellitus without complication, without long-term current  use of insulin (HCC) 09/18/2019   Urticaria     Past Surgical History:  Procedure Laterality Date   APPENDECTOMY     CESAREAN SECTION     CHOLECYSTECTOMY     ganglionectomy     c2/c3 right and left side   NECK SURGERY     5 times   presacral neurectomy     SALPINGOOPHORECTOMY     left   TOTAL ABDOMINAL HYSTERECTOMY      Current Medications: Current Meds  Medication Sig   albuterol (VENTOLIN HFA) 108 (90 Base) MCG/ACT inhaler Inhale 2 puffs into the lungs every 4 (four) hours as needed. (Patient taking differently: Inhale 2 puffs into the lungs every 4 (four) hours as needed for wheezing or shortness of breath.)   aspirin EC 81 MG tablet Take 81 mg by mouth daily. Swallow whole.   Budeson-Glycopyrrol-Formoterol (BREZTRI AEROSPHERE) 160-9-4.8 MCG/ACT AERO Inhale 2 puffs into the lungs 2 (two) times daily.   budesonide-formoterol (SYMBICORT) 160-4.5 MCG/ACT inhaler Inhale 2 puffs into the lungs 2 (two) times daily.   cetirizine (ZYRTEC) 10 MG tablet Take 10 mg by mouth daily.   citalopram (CELEXA) 40 MG tablet Take 40 mg by mouth daily. Takes 1 tablet at bedtime.   EPINEPHrine 0.3 mg/0.3 mL IJ SOAJ injection Use as directed for life-threatening allergic reaction. (Patient taking differently: Inject 0.3 mg into the muscle as needed for anaphylaxis. Use as  directed for life-threatening allergic reaction.)   furosemide (LASIX) 20 MG tablet Take 20 mg by mouth as needed for fluid or edema.   hydrOXYzine (VISTARIL) 100 MG capsule Take 100 mg by mouth as needed for anxiety.   ibuprofen (ADVIL) 800 MG tablet Take 800 mg by mouth every 8 (eight) hours as needed for mild pain or moderate pain.   JARDIANCE 10 MG TABS tablet Take 25 mg by mouth daily.   Melatonin 1 MG TABS Take 50 mg by mouth at bedtime.   metoprolol succinate (TOPROL-XL) 25 MG 24 hr tablet Take 50 mg by mouth daily. Patient takes 1 tablet every day and then taking a tablet and a half every other day   metoprolol succinate  (TOPROL-XL) 50 MG 24 hr tablet Take 1 tablet (50 mg total) by mouth daily. Take with or immediately following a meal.   mirabegron ER (MYRBETRIQ) 50 MG TB24 tablet Take 1 tablet by mouth daily.   montelukast (SINGULAIR) 10 MG tablet Take 10 mg by mouth at bedtime.   rosuvastatin (CRESTOR) 20 MG tablet Take 20 mg by mouth at bedtime.   RYBELSUS 7 MG TABS Take 1 tablet by mouth daily.   temazepam (RESTORIL) 30 MG capsule Take 1 capsule (30 mg total) by mouth at bedtime as needed for sleep.   [DISCONTINUED] eszopiclone (LUNESTA) 2 MG TABS tablet 1 at bedtime for sleep (Patient taking differently: Take 2 mg by mouth at bedtime as needed for sleep. 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  Tobacco Use   Smoking status: Never   Smokeless tobacco: Never  Vaping Use   Vaping Use: Never used  Substance and Sexual Activity   Alcohol use: Not on file   Drug use: Not on file   Sexual activity: Not on file  Other Topics Concern   Not on file  Social History Narrative   Not on file   Social Determinants of Health   Financial Resource Strain: Not on file  Food Insecurity: Not on file  Transportation Needs: Not on file  Physical Activity: Not on file  Stress: Not on file  Social Connections: Not on file     Family History: The patient's family history includes Breast cancer in her maternal aunt; Colon cancer in her cousin; Diabetes in her brother  and father; Heart disease in her maternal grandfather and paternal grandfather; Hypertension in her brother, father, and mother; Pancreatic cancer in her maternal grandmother. ROS:   Please see the history of present illness.    All 14 point review of systems negative except as described per history of present illness  EKGs/Labs/Other Studies Reviewed:      Recent Labs: No results found for requested labs within last 365 days.  Recent Lipid Panel No results found for: "CHOL", "TRIG", "HDL", "CHOLHDL", "VLDL", "LDLCALC", "LDLDIRECT"  Physical Exam:    VS:  BP (!) 144/74 (BP Location: Left Arm, Patient Position: Sitting)   Pulse 91   Ht 5\' 3"  (1.6  m)   Wt 259 lb 9.6 oz (117.8 kg)   LMP  (LMP Unknown)   SpO2 93%   BMI 45.99 kg/m     Wt Readings from Last 3 Encounters:  02/02/22 259 lb 9.6 oz (117.8 kg)  10/26/21 269 lb 9.6 oz (122.3 kg)  07/21/21 275 lb 12.8 oz (125.1 kg)     GEN:  Well nourished, well developed in no acute distress HEENT: Normal NECK: No JVD; No carotid bruits LYMPHATICS: No lymphadenopathy CARDIAC: RRR, no murmurs, no rubs, no gallops RESPIRATORY:  Clear to auscultation without rales, wheezing or rhonchi  ABDOMEN: Soft, non-tender, non-distended MUSCULOSKELETAL:  No edema; No deformity  SKIN: Warm and dry LOWER EXTREMITIES: no swelling NEUROLOGIC:  Alert and oriented x 3 PSYCHIATRIC:  Normal affect   ASSESSMENT:    1. Palpitations   2. Dyspnea on exertion   3. Late effect of cerebrovascular accident (CVA)    PLAN:    In order of problems listed above:  Palpitations: Does control with higher dose of metoprolol however she said she is difficulty cathing feeling to have some 4 weeks she tried to take only 50 mg metoprolol which seems to be controlling situation quite well.  I told her simply to reduce to 50 and take extra half tablet if she got particularly bad day. This morning duration that is improved.  Her echocardiogram showed preserved  ejection fraction.  We did not have assessment of her coronary arteries which was very difficult before because of morbid obesity but however now after she lost 45 pounds I think we will have to do Lexiscan before her surgery for knee.  So she is going to have a visit with orthopedic doctor if they decided to pursue surgical intervention then we will schedule her to have Lexiscan. Late effect of CVA.  Stable. Dyslipidemia I did review her K PN which show me LDL of 62 HDL 37.  I will continue present management which include Crestor 20   Medication Adjustments/Labs and Tests Ordered: Current medicines are reviewed at length with the patient today.  Concerns regarding medicines are outlined above.  No orders of the defined types were placed in this encounter.  Medication changes: No orders of the defined types were placed in this encounter.   Signed, Georgeanna Lea, MD, [facility] 02/02/2022 2:13 PM    [facility]

## 2022-02-02 NOTE — Patient Instructions (Signed)

## 2022-02-03 DIAGNOSIS — Z0000 Encounter for general adult medical examination without abnormal findings: Secondary | ICD-10-CM | POA: Diagnosis not present

## 2022-02-03 DIAGNOSIS — M8580 Other specified disorders of bone density and structure, unspecified site: Secondary | ICD-10-CM | POA: Diagnosis not present

## 2022-02-03 DIAGNOSIS — M85852 Other specified disorders of bone density and structure, left thigh: Secondary | ICD-10-CM | POA: Diagnosis not present

## 2022-02-03 DIAGNOSIS — Z23 Encounter for immunization: Secondary | ICD-10-CM | POA: Diagnosis not present

## 2022-02-09 DIAGNOSIS — I152 Hypertension secondary to endocrine disorders: Secondary | ICD-10-CM | POA: Diagnosis not present

## 2022-02-09 DIAGNOSIS — E1159 Type 2 diabetes mellitus with other circulatory complications: Secondary | ICD-10-CM | POA: Diagnosis not present

## 2022-02-09 DIAGNOSIS — M8580 Other specified disorders of bone density and structure, unspecified site: Secondary | ICD-10-CM | POA: Diagnosis not present

## 2022-02-09 DIAGNOSIS — Z1231 Encounter for screening mammogram for malignant neoplasm of breast: Secondary | ICD-10-CM | POA: Diagnosis not present

## 2022-02-12 DIAGNOSIS — E1159 Type 2 diabetes mellitus with other circulatory complications: Secondary | ICD-10-CM | POA: Diagnosis not present

## 2022-02-12 DIAGNOSIS — J449 Chronic obstructive pulmonary disease, unspecified: Secondary | ICD-10-CM | POA: Diagnosis not present

## 2022-02-12 DIAGNOSIS — I152 Hypertension secondary to endocrine disorders: Secondary | ICD-10-CM | POA: Diagnosis not present

## 2022-02-24 DIAGNOSIS — G43109 Migraine with aura, not intractable, without status migrainosus: Secondary | ICD-10-CM | POA: Diagnosis not present

## 2022-02-25 DIAGNOSIS — Z743 Need for continuous supervision: Secondary | ICD-10-CM | POA: Diagnosis not present

## 2022-02-25 DIAGNOSIS — R1030 Lower abdominal pain, unspecified: Secondary | ICD-10-CM | POA: Diagnosis not present

## 2022-02-25 DIAGNOSIS — R519 Headache, unspecified: Secondary | ICD-10-CM | POA: Diagnosis not present

## 2022-02-25 DIAGNOSIS — I4510 Unspecified right bundle-branch block: Secondary | ICD-10-CM | POA: Diagnosis not present

## 2022-02-25 DIAGNOSIS — K529 Noninfective gastroenteritis and colitis, unspecified: Secondary | ICD-10-CM | POA: Diagnosis not present

## 2022-02-25 DIAGNOSIS — R109 Unspecified abdominal pain: Secondary | ICD-10-CM | POA: Diagnosis not present

## 2022-02-25 DIAGNOSIS — I700 Atherosclerosis of aorta: Secondary | ICD-10-CM | POA: Diagnosis not present

## 2022-02-25 DIAGNOSIS — R1110 Vomiting, unspecified: Secondary | ICD-10-CM | POA: Diagnosis not present

## 2022-03-14 DIAGNOSIS — J449 Chronic obstructive pulmonary disease, unspecified: Secondary | ICD-10-CM | POA: Diagnosis not present

## 2022-03-14 DIAGNOSIS — E1159 Type 2 diabetes mellitus with other circulatory complications: Secondary | ICD-10-CM | POA: Diagnosis not present

## 2022-03-14 DIAGNOSIS — I152 Hypertension secondary to endocrine disorders: Secondary | ICD-10-CM | POA: Diagnosis not present

## 2022-03-29 DIAGNOSIS — G4733 Obstructive sleep apnea (adult) (pediatric): Secondary | ICD-10-CM | POA: Diagnosis not present

## 2022-04-01 DIAGNOSIS — M25562 Pain in left knee: Secondary | ICD-10-CM | POA: Diagnosis not present

## 2022-04-01 DIAGNOSIS — G8929 Other chronic pain: Secondary | ICD-10-CM | POA: Diagnosis not present

## 2022-04-14 ENCOUNTER — Ambulatory Visit: Payer: Medicare Other | Admitting: Cardiology

## 2022-04-14 ENCOUNTER — Other Ambulatory Visit: Payer: Self-pay | Admitting: Internal Medicine

## 2022-04-14 DIAGNOSIS — I152 Hypertension secondary to endocrine disorders: Secondary | ICD-10-CM | POA: Diagnosis not present

## 2022-04-14 DIAGNOSIS — J449 Chronic obstructive pulmonary disease, unspecified: Secondary | ICD-10-CM | POA: Diagnosis not present

## 2022-04-14 DIAGNOSIS — E1159 Type 2 diabetes mellitus with other circulatory complications: Secondary | ICD-10-CM | POA: Diagnosis not present

## 2022-04-14 NOTE — Telephone Encounter (Signed)
Temazepam refilled.

## 2022-04-21 DIAGNOSIS — L659 Nonscarring hair loss, unspecified: Secondary | ICD-10-CM | POA: Diagnosis not present

## 2022-04-21 DIAGNOSIS — M25562 Pain in left knee: Secondary | ICD-10-CM | POA: Diagnosis not present

## 2022-04-21 DIAGNOSIS — G43909 Migraine, unspecified, not intractable, without status migrainosus: Secondary | ICD-10-CM | POA: Diagnosis not present

## 2022-04-28 DIAGNOSIS — Z713 Dietary counseling and surveillance: Secondary | ICD-10-CM | POA: Diagnosis not present

## 2022-04-28 DIAGNOSIS — E1169 Type 2 diabetes mellitus with other specified complication: Secondary | ICD-10-CM | POA: Diagnosis not present

## 2022-04-28 NOTE — Progress Notes (Deleted)
HPI F never smoker followed for OSA, Insomnia, complicated by CVA, OSA, Endometriosis, Asthma, Allergic Rhinitis, HBP,  NPSG [facility] 05/05/10- AHI 9.6/ hr, RDI 13.2/ hr, desaturation to 77%.  ----------------------------------------------------------------------------------------------------------------------------------------- 10/26/21-  65 yoF never smoker followed for OSA, Insomnia, Asthma,  complicated by CVA, OSA, Endometriosis,  Allergic Rhinitis, HBP, DM2,   -Singulair, melatonin, Vistaril, Zyrtec, Symbicort 160, albuterol hfa, Temazepam, CPAP  Auto 5-15/ Home O2 sleep/ [employer] Download- compliance 67%, AHI 0.4/ hr     Short nights Body weight today-269 lbs Covid vax- 3 Moderna Flu vax- no -----Patients daughter states that she stops breathing more at night when sleeping. Patient states that she wears her CPAP but not as much as she should but she knocks it off while sleeping.  She and her daughter are not clear themselves about whether she is pulling CPAP mask off in her sleep or simply dislodging it.  They do not describe complex parasomnias but she can get restless.  Questions if temazepam works well enough and I suggested we try Lunesta.  She is interested in trying different CPAP mask so we will arrange mask fitting.  Download reviewed with very good control when used.  04/29/22- 27 yoF never smoker followed for OSA, Insomnia, Asthma,  complicated by CVA, OSA, Endometriosis,  Allergic Rhinitis, HBP, DM2,  Migraine,  -Singulair, melatonin, Vistaril, Zyrtec, Brezgtri or Symbicort 160, albuterol hfa, Temazepam, CPAP  Auto 5-15/ Home O2 sleep/ [employer] Download- compliance  Body weight today- Covid vax- 3 Moderna Flu vax-      ROS-see HPI   + = positive Constitutional:    weight loss, night sweats, fevers, chills, +fatigue, lassitude. HEENT:    +headaches, difficulty swallowing, tooth/dental problems, sore throat,       +sneezing, itching, ear ache, +nasal congestion, post  nasal drip, snoring CV:    chest pain, orthopnea, PND, +swelling in lower extremities, anasarca,                                   dizziness, palpitations Resp:   shortness of breath with exertion or at rest.                +productive cough,   +non-productive cough, coughing up of blood.              change in color of mucus.  +wheezing.   Skin:    rash or lesions. GI:  No-   heartburn, indigestion, abdominal pain, nausea, vomiting, diarrhea,                 change in bowel habits, loss of appetite GU: dysuria, change in color of urine, no urgency or frequency.   flank pain. MS:   joint pain, stiffness, decreased range of motion, back pain. Neuro-     nothing unusual Psych:  change in mood or affect.  depression or anxiety.   memory loss.  OBJ- Physical Exam General- Alert, Oriented, Affect-appropriate, Distress- none acute, + obese Skin- rash-none, lesions- none, excoriation- none Lymphadenopathy- none Head- atraumatic            Eyes- Gross vision intact, PERRLA, conjunctivae and secretions clear            Ears- Hearing, canals-normal            Nose- Clear, no-Septal dev, mucus, polyps, erosion, perforation             Throat- Mallampati III ,  mucosa clear , drainage- none, tonsils- atrophic,  + many missing teeth Neck- flexible , trachea midline, no stridor , thyroid nl, carotid no bruit Chest - symmetrical excursion , unlabored           Heart/CV- RRR , no murmur , no gallop  , no rub, nl s1 s2                           - JVD- none , edema- none, stasis changes- none, varices- none           Lung- clear to P&A, wheeze- none, cough- none , dullness-none, rub- none           Chest wall-  Abd-  Br/ Gen/ Rectal- Not done, not indicated Extrem- cyanosis- none, clubbing, none, atrophy- none, strength- nl Neuro- grossly intact to observation

## 2022-04-29 ENCOUNTER — Ambulatory Visit: Payer: Medicare Other | Admitting: Internal Medicine

## 2022-05-08 DIAGNOSIS — J45909 Unspecified asthma, uncomplicated: Secondary | ICD-10-CM | POA: Diagnosis not present

## 2022-05-08 DIAGNOSIS — M25562 Pain in left knee: Secondary | ICD-10-CM | POA: Diagnosis not present

## 2022-05-08 DIAGNOSIS — G4730 Sleep apnea, unspecified: Secondary | ICD-10-CM | POA: Diagnosis not present

## 2022-05-08 DIAGNOSIS — G8929 Other chronic pain: Secondary | ICD-10-CM | POA: Diagnosis not present

## 2022-05-08 DIAGNOSIS — Z7982 Long term (current) use of aspirin: Secondary | ICD-10-CM | POA: Diagnosis not present

## 2022-05-08 DIAGNOSIS — M25462 Effusion, left knee: Secondary | ICD-10-CM | POA: Diagnosis not present

## 2022-05-08 DIAGNOSIS — Z7984 Long term (current) use of oral hypoglycemic drugs: Secondary | ICD-10-CM | POA: Diagnosis not present

## 2022-05-08 DIAGNOSIS — Z79899 Other long term (current) drug therapy: Secondary | ICD-10-CM | POA: Diagnosis not present

## 2022-05-08 DIAGNOSIS — Z880 Allergy status to penicillin: Secondary | ICD-10-CM | POA: Diagnosis not present

## 2022-05-08 DIAGNOSIS — Z7951 Long term (current) use of inhaled steroids: Secondary | ICD-10-CM | POA: Diagnosis not present

## 2022-05-10 DIAGNOSIS — I152 Hypertension secondary to endocrine disorders: Secondary | ICD-10-CM | POA: Diagnosis not present

## 2022-05-10 DIAGNOSIS — E1159 Type 2 diabetes mellitus with other circulatory complications: Secondary | ICD-10-CM | POA: Diagnosis not present

## 2022-05-12 DIAGNOSIS — Z713 Dietary counseling and surveillance: Secondary | ICD-10-CM | POA: Diagnosis not present

## 2022-05-12 DIAGNOSIS — E1169 Type 2 diabetes mellitus with other specified complication: Secondary | ICD-10-CM | POA: Diagnosis not present

## 2022-05-14 DIAGNOSIS — J449 Chronic obstructive pulmonary disease, unspecified: Secondary | ICD-10-CM | POA: Diagnosis not present

## 2022-05-14 DIAGNOSIS — E785 Hyperlipidemia, unspecified: Secondary | ICD-10-CM | POA: Diagnosis not present

## 2022-05-14 DIAGNOSIS — E1169 Type 2 diabetes mellitus with other specified complication: Secondary | ICD-10-CM | POA: Diagnosis not present

## 2022-05-14 DIAGNOSIS — Z01818 Encounter for other preprocedural examination: Secondary | ICD-10-CM | POA: Diagnosis not present

## 2022-05-14 DIAGNOSIS — I152 Hypertension secondary to endocrine disorders: Secondary | ICD-10-CM | POA: Diagnosis not present

## 2022-05-14 DIAGNOSIS — E1159 Type 2 diabetes mellitus with other circulatory complications: Secondary | ICD-10-CM | POA: Diagnosis not present

## 2022-05-14 DIAGNOSIS — R9431 Abnormal electrocardiogram [ECG] [EKG]: Secondary | ICD-10-CM | POA: Diagnosis not present

## 2022-05-14 DIAGNOSIS — Z1231 Encounter for screening mammogram for malignant neoplasm of breast: Secondary | ICD-10-CM | POA: Diagnosis not present

## 2022-05-19 DIAGNOSIS — E1169 Type 2 diabetes mellitus with other specified complication: Secondary | ICD-10-CM | POA: Diagnosis not present

## 2022-05-19 DIAGNOSIS — Z713 Dietary counseling and surveillance: Secondary | ICD-10-CM | POA: Diagnosis not present

## 2022-05-20 DIAGNOSIS — R9431 Abnormal electrocardiogram [ECG] [EKG]: Secondary | ICD-10-CM | POA: Diagnosis not present

## 2022-05-20 DIAGNOSIS — E1169 Type 2 diabetes mellitus with other specified complication: Secondary | ICD-10-CM | POA: Diagnosis not present

## 2022-05-20 DIAGNOSIS — R079 Chest pain, unspecified: Secondary | ICD-10-CM | POA: Diagnosis not present

## 2022-05-20 DIAGNOSIS — Z01818 Encounter for other preprocedural examination: Secondary | ICD-10-CM | POA: Diagnosis not present

## 2022-06-03 DIAGNOSIS — E1159 Type 2 diabetes mellitus with other circulatory complications: Secondary | ICD-10-CM | POA: Diagnosis not present

## 2022-06-03 DIAGNOSIS — E785 Hyperlipidemia, unspecified: Secondary | ICD-10-CM | POA: Diagnosis not present

## 2022-06-04 ENCOUNTER — Ambulatory Visit (HOSPITAL_COMMUNITY): Admission: RE | Admit: 2022-06-04 | Payer: Medicare Other | Source: Home / Self Care | Admitting: Oral Surgery

## 2022-06-04 ENCOUNTER — Encounter (HOSPITAL_COMMUNITY): Admission: RE | Payer: Self-pay | Source: Home / Self Care

## 2022-06-04 SURGERY — DENTAL RESTORATION/EXTRACTION WITH X-RAY
Anesthesia: General

## 2022-06-08 DIAGNOSIS — G43909 Migraine, unspecified, not intractable, without status migrainosus: Secondary | ICD-10-CM | POA: Diagnosis not present

## 2022-06-08 DIAGNOSIS — B338 Other specified viral diseases: Secondary | ICD-10-CM | POA: Diagnosis not present

## 2022-06-08 DIAGNOSIS — Z20822 Contact with and (suspected) exposure to covid-19: Secondary | ICD-10-CM | POA: Diagnosis not present

## 2022-06-10 DIAGNOSIS — E785 Hyperlipidemia, unspecified: Secondary | ICD-10-CM | POA: Diagnosis not present

## 2022-06-10 DIAGNOSIS — E1169 Type 2 diabetes mellitus with other specified complication: Secondary | ICD-10-CM | POA: Diagnosis not present

## 2022-06-10 DIAGNOSIS — E1159 Type 2 diabetes mellitus with other circulatory complications: Secondary | ICD-10-CM | POA: Diagnosis not present

## 2022-06-10 DIAGNOSIS — I152 Hypertension secondary to endocrine disorders: Secondary | ICD-10-CM | POA: Diagnosis not present

## 2022-06-14 DIAGNOSIS — E1169 Type 2 diabetes mellitus with other specified complication: Secondary | ICD-10-CM | POA: Diagnosis not present

## 2022-06-14 DIAGNOSIS — J449 Chronic obstructive pulmonary disease, unspecified: Secondary | ICD-10-CM | POA: Diagnosis not present

## 2022-06-14 DIAGNOSIS — E785 Hyperlipidemia, unspecified: Secondary | ICD-10-CM | POA: Diagnosis not present

## 2022-06-22 DIAGNOSIS — G4733 Obstructive sleep apnea (adult) (pediatric): Secondary | ICD-10-CM | POA: Diagnosis not present

## 2022-06-23 DIAGNOSIS — E1169 Type 2 diabetes mellitus with other specified complication: Secondary | ICD-10-CM | POA: Diagnosis not present

## 2022-06-23 DIAGNOSIS — Z713 Dietary counseling and surveillance: Secondary | ICD-10-CM | POA: Diagnosis not present

## 2022-06-29 DIAGNOSIS — Z20822 Contact with and (suspected) exposure to covid-19: Secondary | ICD-10-CM | POA: Diagnosis not present

## 2022-06-29 DIAGNOSIS — J069 Acute upper respiratory infection, unspecified: Secondary | ICD-10-CM | POA: Diagnosis not present

## 2022-07-01 DIAGNOSIS — M25562 Pain in left knee: Secondary | ICD-10-CM | POA: Diagnosis not present

## 2022-07-01 DIAGNOSIS — G8929 Other chronic pain: Secondary | ICD-10-CM | POA: Diagnosis not present

## 2022-07-07 DIAGNOSIS — Z713 Dietary counseling and surveillance: Secondary | ICD-10-CM | POA: Diagnosis not present

## 2022-07-07 DIAGNOSIS — J45909 Unspecified asthma, uncomplicated: Secondary | ICD-10-CM | POA: Diagnosis not present

## 2022-07-08 ENCOUNTER — Emergency Department (HOSPITAL_COMMUNITY): Payer: 59

## 2022-07-08 ENCOUNTER — Emergency Department (HOSPITAL_COMMUNITY)
Admission: EM | Admit: 2022-07-08 | Discharge: 2022-07-09 | Disposition: A | Discharge status: Home / Self Care | Payer: 59 | Attending: Student | Admitting: Student

## 2022-07-08 ENCOUNTER — Other Ambulatory Visit: Payer: Self-pay

## 2022-07-08 ENCOUNTER — Encounter (HOSPITAL_COMMUNITY): Payer: Self-pay | Admitting: Emergency Medicine

## 2022-07-08 DIAGNOSIS — G43809 Other migraine, not intractable, without status migrainosus: Secondary | ICD-10-CM | POA: Diagnosis not present

## 2022-07-08 DIAGNOSIS — Z7982 Long term (current) use of aspirin: Secondary | ICD-10-CM | POA: Insufficient documentation

## 2022-07-08 DIAGNOSIS — Z794 Long term (current) use of insulin: Secondary | ICD-10-CM | POA: Insufficient documentation

## 2022-07-08 DIAGNOSIS — I639 Cerebral infarction, unspecified: Secondary | ICD-10-CM | POA: Insufficient documentation

## 2022-07-08 DIAGNOSIS — G43909 Migraine, unspecified, not intractable, without status migrainosus: Secondary | ICD-10-CM | POA: Diagnosis not present

## 2022-07-08 DIAGNOSIS — I6789 Other cerebrovascular disease: Secondary | ICD-10-CM

## 2022-07-08 DIAGNOSIS — R000 Tachycardia, unspecified: Secondary | ICD-10-CM | POA: Insufficient documentation

## 2022-07-08 DIAGNOSIS — G319 Degenerative disease of nervous system, unspecified: Secondary | ICD-10-CM | POA: Diagnosis not present

## 2022-07-08 DIAGNOSIS — Z1152 Encounter for screening for COVID-19: Secondary | ICD-10-CM | POA: Insufficient documentation

## 2022-07-08 DIAGNOSIS — Z7951 Long term (current) use of inhaled steroids: Secondary | ICD-10-CM | POA: Insufficient documentation

## 2022-07-08 DIAGNOSIS — R0789 Other chest pain: Secondary | ICD-10-CM | POA: Diagnosis not present

## 2022-07-08 DIAGNOSIS — Z20822 Contact with and (suspected) exposure to covid-19: Secondary | ICD-10-CM | POA: Diagnosis not present

## 2022-07-08 DIAGNOSIS — R0602 Shortness of breath: Secondary | ICD-10-CM | POA: Diagnosis not present

## 2022-07-08 DIAGNOSIS — R7401 Elevation of levels of liver transaminase levels: Secondary | ICD-10-CM | POA: Diagnosis not present

## 2022-07-08 DIAGNOSIS — R079 Chest pain, unspecified: Secondary | ICD-10-CM | POA: Diagnosis not present

## 2022-07-08 DIAGNOSIS — E1169 Type 2 diabetes mellitus with other specified complication: Secondary | ICD-10-CM | POA: Diagnosis not present

## 2022-07-08 DIAGNOSIS — G919 Hydrocephalus, unspecified: Secondary | ICD-10-CM | POA: Diagnosis not present

## 2022-07-08 DIAGNOSIS — Z79899 Other long term (current) drug therapy: Secondary | ICD-10-CM | POA: Diagnosis not present

## 2022-07-08 DIAGNOSIS — J4520 Mild intermittent asthma, uncomplicated: Secondary | ICD-10-CM | POA: Insufficient documentation

## 2022-07-08 DIAGNOSIS — E119 Type 2 diabetes mellitus without complications: Secondary | ICD-10-CM | POA: Diagnosis not present

## 2022-07-08 DIAGNOSIS — D72829 Elevated white blood cell count, unspecified: Secondary | ICD-10-CM | POA: Diagnosis not present

## 2022-07-08 DIAGNOSIS — R945 Abnormal results of liver function studies: Secondary | ICD-10-CM | POA: Diagnosis not present

## 2022-07-08 DIAGNOSIS — R29818 Other symptoms and signs involving the nervous system: Secondary | ICD-10-CM | POA: Diagnosis not present

## 2022-07-08 DIAGNOSIS — I6782 Cerebral ischemia: Secondary | ICD-10-CM | POA: Diagnosis not present

## 2022-07-08 DIAGNOSIS — E876 Hypokalemia: Secondary | ICD-10-CM | POA: Insufficient documentation

## 2022-07-08 LAB — CBC WITH DIFFERENTIAL/PLATELET
Abs Immature Granulocytes: 0.11 10*3/uL — ABNORMAL HIGH (ref 0.00–0.07)
Basophils Absolute: 0.1 10*3/uL (ref 0.0–0.1)
Basophils Relative: 1 %
Eosinophils Absolute: 1.1 10*3/uL — ABNORMAL HIGH (ref 0.0–0.5)
Eosinophils Relative: 7 %
HCT: 45.2 % (ref 36.0–46.0)
Hemoglobin: 15.1 g/dL — ABNORMAL HIGH (ref 12.0–15.0)
Immature Granulocytes: 1 %
Lymphocytes Relative: 22 %
Lymphs Abs: 3.4 10*3/uL (ref 0.7–4.0)
MCH: 29.2 pg (ref 26.0–34.0)
MCHC: 33.4 g/dL (ref 30.0–36.0)
MCV: 87.4 fL (ref 80.0–100.0)
Monocytes Absolute: 1 10*3/uL (ref 0.1–1.0)
Monocytes Relative: 7 %
Neutro Abs: 9.4 10*3/uL — ABNORMAL HIGH (ref 1.7–7.7)
Neutrophils Relative %: 62 %
Platelets: 317 10*3/uL (ref 150–400)
RBC: 5.17 MIL/uL — ABNORMAL HIGH (ref 3.87–5.11)
RDW: 14 % (ref 11.5–15.5)
WBC: 15.1 10*3/uL — ABNORMAL HIGH (ref 4.0–10.5)
nRBC: 0 % (ref 0.0–0.2)

## 2022-07-08 LAB — COMPREHENSIVE METABOLIC PANEL
ALT: 46 U/L — ABNORMAL HIGH (ref 0–44)
AST: 53 U/L — ABNORMAL HIGH (ref 15–41)
Albumin: 4.1 g/dL (ref 3.5–5.0)
Alkaline Phosphatase: 136 U/L — ABNORMAL HIGH (ref 38–126)
Anion gap: 14 (ref 5–15)
BUN: 19 mg/dL (ref 6–20)
CO2: 20 mmol/L — ABNORMAL LOW (ref 22–32)
Calcium: 9.9 mg/dL (ref 8.9–10.3)
Chloride: 100 mmol/L (ref 98–111)
Creatinine, Ser: 0.93 mg/dL (ref 0.44–1.00)
GFR, Estimated: >60 mL/min (ref >60)
Glucose, Bld: 138 mg/dL — ABNORMAL HIGH (ref 70–99)
Potassium: 3.4 mmol/L — ABNORMAL LOW (ref 3.5–5.1)
Sodium: 134 mmol/L — ABNORMAL LOW (ref 135–145)
Total Bilirubin: 1.1 mg/dL (ref 0.3–1.2)
Total Protein: 8.4 g/dL — ABNORMAL HIGH (ref 6.5–8.1)

## 2022-07-08 LAB — BASIC METABOLIC PANEL
Anion gap: 14 (ref 5–15)
BUN: 17 mg/dL (ref 6–20)
CO2: 23 mmol/L (ref 22–32)
Calcium: 9.9 mg/dL (ref 8.9–10.3)
Chloride: 99 mmol/L (ref 98–111)
Creatinine, Ser: 0.91 mg/dL (ref 0.44–1.00)
GFR, Estimated: >60 mL/min (ref >60)
Glucose, Bld: 136 mg/dL — ABNORMAL HIGH (ref 70–99)
Potassium: 3.9 mmol/L (ref 3.5–5.1)
Sodium: 136 mmol/L (ref 135–145)

## 2022-07-08 LAB — I-STAT CHEM 8, ED
BUN: 21 mg/dL — ABNORMAL HIGH (ref 6–20)
Calcium, Ion: 1.11 mmol/L — ABNORMAL LOW (ref 1.15–1.40)
Chloride: 104 mmol/L (ref 98–111)
Creatinine, Ser: 0.7 mg/dL (ref 0.44–1.00)
Glucose, Bld: 128 mg/dL — ABNORMAL HIGH (ref 70–99)
HCT: 46 % (ref 36.0–46.0)
Hemoglobin: 15.6 g/dL — ABNORMAL HIGH (ref 12.0–15.0)
Potassium: 3.7 mmol/L (ref 3.5–5.1)
Sodium: 138 mmol/L (ref 135–145)
TCO2: 20 mmol/L — ABNORMAL LOW (ref 22–32)

## 2022-07-08 LAB — APTT: aPTT: 29 seconds (ref 24–36)

## 2022-07-08 LAB — RESP PANEL BY RT-PCR (RSV, FLU A&B, COVID)  RVPGX2
Influenza A by PCR: NEGATIVE
Influenza B by PCR: NEGATIVE
Resp Syncytial Virus by PCR: NEGATIVE
SARS Coronavirus 2 by RT PCR: NEGATIVE

## 2022-07-08 LAB — I-STAT BETA HCG BLOOD, ED (MC, WL, AP ONLY): I-stat hCG, quantitative: <5 m[IU]/mL (ref <5)

## 2022-07-08 LAB — PROTIME-INR
INR: 1.1 (ref 0.8–1.2)
Prothrombin Time: 14.6 seconds (ref 11.4–15.2)

## 2022-07-08 LAB — ETHANOL: Alcohol, Ethyl (B): <10 mg/dL (ref <10)

## 2022-07-08 LAB — CBG MONITORING, ED: Glucose-Capillary: 133 mg/dL — ABNORMAL HIGH (ref 70–99)

## 2022-07-08 LAB — TROPONIN I (HIGH SENSITIVITY)
Troponin I (High Sensitivity): 26 ng/L — ABNORMAL HIGH (ref <18)
Troponin I (High Sensitivity): 24 ng/L — ABNORMAL HIGH (ref <18)

## 2022-07-08 MED ORDER — DIPHENHYDRAMINE HCL 50 MG/ML IJ SOLN
25.0000 mg | Freq: Once | INTRAMUSCULAR | Status: AC
  Administered 2022-07-09: 25 mg via INTRAVENOUS
  Filled 2022-07-08: qty 1

## 2022-07-08 MED ORDER — LORAZEPAM 2 MG/ML IJ SOLN
0.5000 mg | Freq: Once | INTRAMUSCULAR | Status: AC | PRN
  Administered 2022-07-09: 0.5 mg via INTRAVENOUS
  Filled 2022-07-08: qty 1

## 2022-07-08 MED ORDER — ONDANSETRON HCL 4 MG/2ML IJ SOLN
4.0000 mg | Freq: Once | INTRAMUSCULAR | Status: AC
  Administered 2022-07-09: 4 mg via INTRAVENOUS
  Filled 2022-07-08: qty 2

## 2022-07-08 MED ORDER — KETOROLAC TROMETHAMINE 15 MG/ML IJ SOLN
15.0000 mg | Freq: Once | INTRAMUSCULAR | Status: AC
  Administered 2022-07-09: 15 mg via INTRAVENOUS
  Filled 2022-07-08: qty 1

## 2022-07-08 NOTE — ED Provider Triage Note (Signed)
Emergency Medicine Provider Triage Evaluation Note  Veronica Leblanc , a 62 y.o. female  was evaluated in triage.  Pt complains of chest pain and shortness of breath.  Patient states she tested positive for RSV 2 weeks ago.  Since then she has had had increased cough shortness of breath.  Last night she developed chest pain that is constant and is the center of the chest.  Denies fever.  Review of Systems  Positive: As above Negative: As above  Physical Exam  BP (!) 138/91 (BP Location: Right Arm)   Pulse (!) 119   Temp 98.4 F (36.9 C)   Resp 19   LMP  (LMP Unknown)   SpO2 100%  Gen:   Awake, no distress   Resp:  Normal effort  MSK:   Moves extremities without difficulty  Other:    Medical Decision Making  Medically screening exam initiated at 6:23 PM.  Appropriate orders placed.  Veronica Leblanc was informed that the remainder of the evaluation will be completed by another provider, this initial triage assessment does not replace that evaluation, and the importance of remaining in the ED until their evaluation is complete.    Rex Kras, [place] 07/08/22 Jeri Lager

## 2022-07-08 NOTE — ED Notes (Signed)
CTA delayed d/t difficult IV access. EDP in CT to assist with US guided IV.

## 2022-07-08 NOTE — ED Triage Notes (Signed)
Pt reports chest pain and cough that is worse with deep breathing. Pt reports she was sent to the ER from [facility] because of an abnormal EKG.

## 2022-07-08 NOTE — ED Provider Notes (Signed)
[facility] Provider Note  CSN: [id number] Arrival date & time: 07/08/22 1749  Chief Complaint(s) Chest Pain  HPI Veronica Leblanc is a 62 y.o. female with PMH previous CVA with right-sided sensory deficits, OSA, asthma, T2DM who presents emergency department for evaluation of multiple complaints including chest pain and headache.  She states that chest pain began on 07/07/2022, went to urgent care and was ultimately transferred to the emergency department for further evaluation.  While waiting in the waiting room, patient started to have worsening headache with right-sided facial numbness and right-sided weakness and ultimately a code stroke was activated.  Currently she states that the chest pain is fairly minimal but her headache pain is out of control.  She denies shortness of breath, abdominal pain, nausea, vomiting or other systemic symptoms.   Past Medical History Past Medical History:  Diagnosis Date   Acute, but ill-defined, cerebrovascular disease    Arthritis    Asthma    Asthma, mild intermittent 03/21/2019   Chronic pain of left knee 09/07/2018   CVA (cerebral vascular accident) [facility])    CVA (cerebral vascular accident) ([place])    Age 17   Dyspnea on exertion 09/18/2019   Eczema    Endometriosis, site unspecified    History of migraine headaches    Insomnia 08/30/2019   Late effect of cerebrovascular accident (CVA) 09/18/2019   Nocturnal hypoxemia 05/10/2020   Obstructive sleep apnea 09/09/2010   PSG from 05/06/10>>AHI 9.6, SpO2 low 77% from [place].   CPAP titration 07/16/10>>CPAP 8 cm from [place].    OSA (obstructive sleep apnea)    Palpitations 09/18/2019   S/P left knee arthroscopy 05/08/2019   Type 2 diabetes mellitus without complication, without long-term current use of insulin ([place]) 09/18/2019   Urticaria    Patient Active Problem List   Diagnosis Date Noted   Morbid (severe) obesity due to excess calories ([place])  10/26/2021   OSA (obstructive sleep apnea)    History of migraine headaches    CVA (cerebral vascular accident) ([place])    Arthritis    Nocturnal hypoxemia 05/10/2020   Palpitations 09/18/2019   Dyspnea on exertion 09/18/2019   Late effect of cerebrovascular accident (CVA) 09/18/2019   Type 2 diabetes mellitus without complication, without long-term current use of insulin ([place]) 09/18/2019   Insomnia 08/30/2019   S/P left knee arthroscopy 05/08/2019   Asthma, mild intermittent 03/21/2019   Chronic pain of left knee 09/07/2018   Obstructive sleep apnea 09/09/2010   C V A / STROKE 08/12/2010   ENDOMETRIOSIS 08/12/2010   Home Medication(s) Prior to Admission medications   Medication Sig Start Date End Date Taking? Authorizing Provider  albuterol (VENTOLIN HFA) 108 (90 Base) MCG/ACT inhaler Inhale 2 puffs into the lungs every 4 (four) hours as needed. Patient taking differently: Inhale 2 puffs into the lungs every 4 (four) hours as needed for wheezing or shortness of breath. 11/19/20   Baird Lyons D, MD  aspirin EC 81 MG tablet Take 81 mg by mouth daily. Swallow whole.    [provider]  Budeson-Glycopyrrol-Formoterol (BREZTRI AEROSPHERE) 160-9-4.8 MCG/ACT AERO Inhale 2 puffs into the lungs 2 (two) times daily. 10/26/21   Deneise Lever, MD  budesonide-formoterol (SYMBICORT) 160-4.5 MCG/ACT inhaler Inhale 2 puffs into the lungs 2 (two) times daily.    [provider]  cetirizine (ZYRTEC) 10 MG tablet Take 10 mg by mouth daily.    [provider]  citalopram (CELEXA) 40 MG tablet  Take 40 mg by mouth daily. Takes 1 tablet at bedtime.    [provider]  EPINEPHrine 0.3 mg/0.3 mL IJ SOAJ injection Use as directed for life-threatening allergic reaction. Patient taking differently: Inject 0.3 mg into the muscle as needed for anaphylaxis. Use as directed for life-threatening allergic reaction. 10/16/20   Kennith Gain, MD  furosemide (LASIX) 20 MG  tablet Take 20 mg by mouth as needed for fluid or edema.    [provider]  hydrOXYzine (VISTARIL) 100 MG capsule Take 100 mg by mouth as needed for anxiety.    [provider]  ibuprofen (ADVIL) 800 MG tablet Take 800 mg by mouth every 8 (eight) hours as needed for mild pain or moderate pain.    [provider]  JARDIANCE 10 MG TABS tablet Take 25 mg by mouth daily. 08/28/19   [provider]  Melatonin 1 MG TABS Take 50 mg by mouth at bedtime.    [provider]  metoprolol succinate (TOPROL-XL) 25 MG 24 hr tablet Take 50 mg by mouth daily. Patient takes 1 tablet every day and then taking a tablet and a half every other day 10/22/19   [provider]  metoprolol succinate (TOPROL-XL) 50 MG 24 hr tablet Take 1 tablet (50 mg total) by mouth daily. Take with or immediately following a meal. 07/21/21   Park Liter, MD  mirabegron ER (MYRBETRIQ) 50 MG TB24 tablet Take 1 tablet by mouth daily. 10/05/21   [provider]  montelukast (SINGULAIR) 10 MG tablet Take 10 mg by mouth at bedtime.    [provider]  rosuvastatin (CRESTOR) 20 MG tablet Take 20 mg by mouth at bedtime. 08/22/19   [provider]  RYBELSUS 7 MG TABS Take 1 tablet by mouth daily. 08/27/21   [provider]  temazepam (RESTORIL) 30 MG capsule Take 1 capsule (30 mg total) by mouth at bedtime as needed for sleep. 04/14/22   Deneise Lever, MD                                                                                                                                    Past Surgical History Past Surgical History:  Procedure Laterality Date   APPENDECTOMY     CESAREAN SECTION     CHOLECYSTECTOMY     ganglionectomy     c2/c3 right and left side   NECK SURGERY     5 times   presacral neurectomy     SALPINGOOPHORECTOMY     left   TOTAL ABDOMINAL HYSTERECTOMY     Family History Family History  Problem Relation Age of Onset   Diabetes  Father    Hypertension Father    Diabetes Brother        multiple   Hypertension Mother    Hypertension Brother    Breast cancer Maternal Aunt    Heart disease Maternal Grandfather  Heart disease Paternal Grandfather    Colon cancer Cousin    Pancreatic cancer Maternal Grandmother     Social History Social History   Tobacco Use   Smoking status: Never   Smokeless tobacco: Never  Vaping Use   Vaping Use: Never used   Allergies Butorphanol, Metoclopramide, Other, Amitriptyline hcl, Cefazolin, Ceftriaxone sodium, Ciprofloxacin, Darvocet [propoxyphene n-acetaminophen], Penicillins, Prochlorperazine edisylate, Promethazine hcl, Propoxyphene, Sulfamethoxazole, Sulfonamide derivatives, and Sumatriptan  Review of Systems Review of Systems  Cardiovascular:  Positive for chest pain.  Neurological:  Positive for numbness and headaches.    Physical Exam Vital Signs  I have reviewed the triage vital signs BP (!) 151/96 (BP Location: Right Arm)   Pulse (!) 127   Temp 98.2 F (36.8 C) (Oral)   Resp (!) 29   Ht 5\' 3"  (1.6 m)   Wt 110.8 kg   LMP  (LMP Unknown)   SpO2 100%   BMI 43.27 kg/m   Physical Exam Vitals and nursing note reviewed.  Constitutional:      General: She is not in acute distress.    Appearance: She is well-developed.  HENT:     Head: Normocephalic and atraumatic.  Eyes:     Conjunctiva/sclera: Conjunctivae normal.  Cardiovascular:     Rate and Rhythm: Regular rhythm. Tachycardia present.     Heart sounds: No murmur heard. Pulmonary:     Effort: Pulmonary effort is normal. No respiratory distress.     Breath sounds: Normal breath sounds.  Abdominal:     Palpations: Abdomen is soft.     Tenderness: There is no abdominal tenderness.  Musculoskeletal:        General: No swelling.     Cervical back: Neck supple.  Skin:    General: Skin is warm and dry.     Capillary Refill: Capillary refill takes less than 2 seconds.  Neurological:     Mental  Status: She is alert.     Cranial Nerves: No cranial nerve deficit.     Sensory: No sensory deficit.     Motor: No weakness.  Psychiatric:        Mood and Affect: Mood normal.     ED Results and Treatments Labs (all labs ordered are listed, but only abnormal results are displayed) Labs Reviewed  BASIC METABOLIC PANEL - Abnormal; Notable for the following components:      Result Value   Glucose, Bld 136 (*)    All other components within normal limits  CBC WITH DIFFERENTIAL/PLATELET - Abnormal; Notable for the following components:   WBC 15.1 (*)    RBC 5.17 (*)    Hemoglobin 15.1 (*)    Neutro Abs 9.4 (*)    Eosinophils Absolute 1.1 (*)    Abs Immature Granulocytes 0.11 (*)    All other components within normal limits  COMPREHENSIVE METABOLIC PANEL - Abnormal; Notable for the following components:   Sodium 134 (*)    Potassium 3.4 (*)    CO2 20 (*)    Glucose, Bld 138 (*)    Total Protein 8.4 (*)    AST 53 (*)    ALT 46 (*)    Alkaline Phosphatase 136 (*)    All other components within normal limits  I-STAT CHEM 8, ED - Abnormal; Notable for the following components:   BUN 21 (*)    Glucose, Bld 128 (*)    Calcium, Ion 1.11 (*)    TCO2 20 (*)    Hemoglobin 15.6 (*)  All other components within normal limits  CBG MONITORING, ED - Abnormal; Notable for the following components:   Glucose-Capillary 133 (*)    All other components within normal limits  TROPONIN I (HIGH SENSITIVITY) - Abnormal; Notable for the following components:   Troponin I (High Sensitivity) 26 (*)    All other components within normal limits  TROPONIN I (HIGH SENSITIVITY) - Abnormal; Notable for the following components:   Troponin I (High Sensitivity) 24 (*)    All other components within normal limits  RESP PANEL BY RT-PCR (RSV, FLU A&B, COVID)  RVPGX2  ETHANOL  PROTIME-INR  APTT  RAPID URINE DRUG SCREEN, HOSP PERFORMED  URINALYSIS, ROUTINE W REFLEX MICROSCOPIC  I-STAT BETA HCG BLOOD, ED  (MC, [facility], AP ONLY)                                                                                                                          Radiology CT HEAD CODE STROKE WO CONTRAST  Result Date: 07/08/2022 CLINICAL DATA:  Code stroke. EXAM: CT HEAD WITHOUT CONTRAST TECHNIQUE: Contiguous axial images were obtained from the base of the skull through the vertex without intravenous contrast. RADIATION DOSE REDUCTION: This exam was performed according to the departmental dose-optimization program which includes automated exposure control, adjustment of the mA and/or kV according to patient size and/or use of iterative reconstruction technique. COMPARISON:  Prior study from 05/15/2021. FINDINGS: Brain: Age-related cerebral atrophy with chronic small vessel ischemic disease. No acute intracranial hemorrhage. No acute large vessel territory infarct. No mass lesion, mass effect or midline shift. Ventricular prominence related to global parenchymal volume loss of hydrocephalus. No extra-axial fluid collection. Vascular: No asymmetric hyperdense vessel. Scattered vascular calcifications noted within the carotid siphons. Skull: Scalp soft tissues and calvarium within normal limits. Sinuses/Orbits: Globes orbital soft tissues demonstrate no acute finding. Paranasal sinuses and mastoid air cells are clear. Other: None. ASPECTS [facility] Stroke Program Early CT Score) - Ganglionic level infarction (caudate, lentiform nuclei, internal capsule, insula, M1-M3 cortex): 7 - Supraganglionic infarction (M4-M6 cortex): 3 Total score (0-10 with 10 being normal): 10 IMPRESSION: 1. No acute intracranial abnormality. 2. ASPECTS is 10. 3. Age-related cerebral atrophy with chronic small vessel ischemic disease. These results were communicated to Dr. Wilford Corner at 10:32 pm on 07/08/2022 by text page via the [facility] messaging system. Electronically Signed   By: Rise Mu M.D.   On: 07/08/2022 22:33   DG Chest 2 View  Result Date:  07/08/2022 CLINICAL DATA:  Short of breath EXAM: CHEST - 2 VIEW COMPARISON:  06/14/2019 FINDINGS: The heart size and mediastinal contours are within normal limits. Both lungs are clear. Degenerative changes of the spine. IMPRESSION: No active cardiopulmonary disease. Electronically Signed   By: Jasmine Pang M.D.   On: 07/08/2022 18:58    Pertinent labs & imaging results that were available during my care of the patient were reviewed by me and considered in my medical decision making (see MDM for details).  Medications  Ordered in ED Medications - No data to display                                                                                                                                   Procedures .Critical Care  Performed by: Teressa Lower, MD Authorized by: Teressa Lower, MD   Critical care provider statement:    Critical care time (minutes):  30   Critical care was necessary to treat or prevent imminent or life-threatening deterioration of the following conditions:  CNS failure or compromise   Critical care was time spent personally by me on the following activities:  Development of treatment plan with patient or surrogate, discussions with consultants, evaluation of patient's response to treatment, examination of patient, ordering and review of laboratory studies, ordering and review of radiographic studies, ordering and performing treatments and interventions, pulse oximetry, re-evaluation of patient's condition and review of old charts   (including critical care time)  Medical Decision Making / ED Course   This patient presents to the ED for concern of headache, chest pain, facial numbness and tingling, this involves an extensive number of treatment options, and is a complaint that carries with it a high risk of complications and morbidity.  The differential diagnosis includes complex migraine, CVA, ACS, pneumonia, COVID-19, influenza, RSV, unspecified viral URI, intracranial  mass  MDM: Patient seen the emergency room for evaluation of multiple complaints including chest pain, facial numbness and weakness.  Stroke alert called from the lobby due to sudden onset neurologic symptoms with worst headache of life and stroke imaging is reassuringly negative.  Laboratory evaluation with mild hypokalemia to 3.4, AST 53, ALT 46, leukocytosis to 15.1, hemoglobin 15.1 likely elevated in setting of her OSA.  High-sensitivity troponin minimally elevated at 26 with delta troponin downtrending to 24.  ECG with right bundle branch block and no evidence of ischemia.  Patient low risk by Wells criteria and I have very low suspicion for pulmonary embolism this patient given no shortness of breath, pleurisy or hypoxia.  Patient's heart score is 3 and I have low suspicion for ACS at this time.  Patient required multiple medications to get control of her migraine headache including an initial headache cocktail with Benadryl, Ativan, Toradol and ultimately followed with gabapentin, oxycodone Haldol and lorazepam.  After an extended ER stay, patient did have resolution of her headache symptoms and her chest pain and shortness of breath has resolved as well.  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  Thus I placed a ambulatory referral to neurology and she was discharged with return precautions which she voiced understanding.   Additional history obtained: -Additional history obtained from multiple family members -External records from outside source obtained and reviewed including: Chart review including previous notes, labs, imaging, consultation notes   Lab Tests: -I ordered, reviewed, and interpreted labs.   The  pertinent results include:   Labs Reviewed  BASIC METABOLIC PANEL - Abnormal; Notable for the following components:      Result Value   Glucose, Bld 136 (*)    All other components within normal limits   CBC WITH DIFFERENTIAL/PLATELET - Abnormal; Notable for the following components:   WBC 15.1 (*)    RBC 5.17 (*)    Hemoglobin 15.1 (*)    Neutro Abs 9.4 (*)    Eosinophils Absolute 1.1 (*)    Abs Immature Granulocytes 0.11 (*)    All other components within normal limits  COMPREHENSIVE METABOLIC PANEL - Abnormal; Notable for the following components:   Sodium 134 (*)    Potassium 3.4 (*)    CO2 20 (*)    Glucose, Bld 138 (*)    Total Protein 8.4 (*)    AST 53 (*)    ALT 46 (*)    Alkaline Phosphatase 136 (*)    All other components within normal limits  I-STAT CHEM 8, ED - Abnormal; Notable for the following components:   BUN 21 (*)    Glucose, Bld 128 (*)    Calcium, Ion 1.11 (*)    TCO2 20 (*)    Hemoglobin 15.6 (*)    All other components within normal limits  CBG MONITORING, ED - Abnormal; Notable for the following components:   Glucose-Capillary 133 (*)    All other components within normal limits  TROPONIN I (HIGH SENSITIVITY) - Abnormal; Notable for the following components:   Troponin I (High Sensitivity) 26 (*)    All other components within normal limits  TROPONIN I (HIGH SENSITIVITY) - Abnormal; Notable for the following components:   Troponin I (High Sensitivity) 24 (*)    All other components within normal limits  RESP PANEL BY RT-PCR (RSV, FLU A&B, COVID)  RVPGX2  ETHANOL  PROTIME-INR  APTT  RAPID URINE DRUG SCREEN, HOSP PERFORMED  URINALYSIS, ROUTINE W REFLEX MICROSCOPIC  I-STAT BETA HCG BLOOD, ED ([facility], [facility], AP ONLY)      EKG   EKG Interpretation  Date/Time:  Friday July 09 2022 04:27:21 EST Ventricular Rate:  115 PR Interval:  148 QRS Duration: 134 QT Interval:  372 QTC Calculation: 515 R Axis:   104 Text Interpretation: Sinus tachycardia RBBB and LPFB Confirmed by [place] ([id number]) on 07/09/2022 6:49:37 AM         Imaging Studies ordered: I ordered imaging studies including CT head, MRI brain, chest x-ray I independently visualized  and interpreted imaging. I agree with the radiologist interpretation   Medicines ordered and prescription drug management: No orders of the defined types were placed in this encounter.   -I have reviewed the patients home medicines and have made adjustments as needed  Critical interventions Stroke activation and consideration of TNK  Consultations Obtained: I requested consultation with the stroke neurologist Dr. Rory Percy,  and discussed lab and imaging findings as well as pertinent plan - they recommend: Treatment of complex migraine   Cardiac Monitoring: The patient was maintained on a cardiac monitor.  I personally viewed and interpreted the cardiac monitored which showed an underlying rhythm of: Sinus tachycardia  Social Determinants of Health:  Factors impacting patients care include: none   Reevaluation: After the interventions noted above, I reevaluated the patient and found that they have :improved  Co morbidities that complicate the patient evaluation  Past Medical History:  Diagnosis Date   Acute, but ill-defined, cerebrovascular disease    Arthritis  Asthma    Asthma, mild intermittent 03/21/2019   Chronic pain of left knee 09/07/2018   CVA (cere[facility] vascular accident) [facility])    CVA (cerebral vascular accident) ([place])    Age 74   Dyspnea on exertion 09/18/2019   Eczema    Endometriosis, site unspecified    History of migraine headaches    Insomnia 08/30/2019   Late effect of cerebrovascular accident (CVA) 09/18/2019   Nocturnal hypoxemia 05/10/2020   Obstructive sleep apnea 09/09/2010   PSG from 05/06/10>>AHI 9.6, SpO2 low 77% from [place].   CPAP titration 07/16/10>>CPAP 8 cm from [place].    OSA (obstructive sleep apnea)    Palpitations 09/18/2019   S/P left knee arthroscopy 05/08/2019   Type 2 diabetes mellitus without complication, without long-term current use of insulin ([place]) 09/18/2019   Urticaria       Dispostion: I considered admission for this patient,  but with symptoms improved, heart score of 3 and low risk by Wells criteria, patient safe for discharge and outpatient follow-up and return precautions     Final Clinical Impression(s) / ED Diagnoses Final diagnoses:  C V A / STROKE     @PCDICTATION @    Teressa Lower, MD 07/09/22 1615

## 2022-07-08 NOTE — ED Provider Triage Note (Addendum)
Emergency Medicine Provider Triage Evaluation Note  Veronica Leblanc , a 62 y.o. female  was evaluated in triage.  Pt complains of sudden onset right facial numbness, right-sided weakness of arm, leg.  Patient with previous history of stroke, she does not take any blood thinners.  She was already in the waiting room, being seen evaluated for chest pain, cough, shortness of breath.  Review of Systems  Positive: Chest pain, right sided weakness, numbness Negative: Fever, chills  Physical Exam  BP (!) 151/96 (BP Location: Right Arm)   Pulse (!) 127   Temp 98.2 F (36.8 C) (Oral)   Resp (!) 29   LMP  (LMP Unknown)   SpO2 100%  Gen:   Awake, no distress   Resp:  Normal effort  MSK:   Moves extremities without difficulty  Other:  Patient with decreased strength to right shoulder raise, flexion, extension of right upper extremity, and flexion at the right hip.  Medical Decision Making  Medically screening exam initiated at 10:13 PM.  Appropriate orders placed.  Veronica Leblanc was informed that the remainder of the evaluation will be completed by another provider, this initial triage assessment does not replace that evaluation, and the importance of remaining in the ED until their evaluation is complete.  Workup initiated in triage Patient with no focal deficits of strength when she was first evaluated, now with some weakness on the right side, questionable facial droop, no dysarthria, aphasia.  She additionally endorses some dizziness with extraocular movements but is able to perform.  Code stroke activated secondary to symptoms and time of onset   Anselmo Pickler, PA-C 07/08/22 2215    Anselmo Pickler, [place] 07/08/22 2215

## 2022-07-08 NOTE — Code Documentation (Signed)
Stroke Response Nurse Documentation Code Documentation  Veronica Leblanc is a 62 y.o. female arriving to [facility]  via [employer] on 1/25 with past medical hx of CVA, MO, OSA, DM2. On aspirin 81 mg daily. Code stroke was activated by ED.   Patient from home where she was LKW at 2150 and now complaining of right sided numbness.  Stroke team at the bedside on patient arrival. Labs drawn and patient cleared for CT by Dr. Matilde Sprang. Patient to CT with team. NIHSS 1, see documentation for details and code stroke times. Patient with right decreased sensation on exam. The following imaging was completed:  CT Head and MRI. Patient is not a candidate for IV Thrombolytic due to outside of window. Patient is not a candidate for IR due to MRI negative for stroke.     Bedside handoff with ED RN Bryson Ha.    Madelynn Done  Rapid Response RN

## 2022-07-08 NOTE — ED Provider Notes (Signed)
Procedures EMERGENCY DEPARTMENT  US GUIDANCE EXAM Emergency Ultrasound:  US Guidance for Needle Guidance  INDICATIONS: Difficult vascular access Linear probe used in real-time to visualize location of needle entry through skin.   PERFORMED BY: Myself IMAGES ARCHIVED?: No LIMITATIONS: None VIEWS USED: Transverse INTERPRETATION: Needle visualized within vein    Blanchie Dessert, MD 07/08/22 2254

## 2022-07-08 NOTE — Consult Note (Addendum)
Neurology Consultation  Reason for Consult: Right-sided numbness, code stroke Referring Physician: Dr. Curly Shores  CC: Right-sided weakness and numbness, chest pain, headache-code stroke  History is obtained from: Patient, chart, patient's daughters at bedside  HPI: Veronica Leblanc is a 62 y.o. female past medical history of stroke at the age of 42 due to being on birth control with residual right-sided sensory deficits, obstructive sleep apnea, asthma, chronic left knee pain, type 2 diabetes, presented to the emergency room for evaluation of chest pain that started on over 24 hours.  She started having chest discomfort on the night of 07/07/2022, did not make much of it, it did not improve by the next morning, went to the urgent care, where an EKG was done and deemed that it was abnormal and recommended coming to the emergency room.  She was dropped to the emergency room by a friend around 5:45 PM and while waiting evaluation, right around 10 PM she started complaining of some right facial numbness and right-sided weakness to the family.  She was probably having some symptoms with her headache since yesterday but did not complain much because they were not very severe.  Code stroke was activated.  Stat CT head was done-unremarkable.  CT angio was obtained with delay but unfortunately the IV line infiltrated and images were not obtained. Reports severe headache mostly involving the right side of her head throbbing in nature with photophobia and phonophobia-like her typical migraines.  She has severe headaches ever since she had a stroke at the age of 74. No recent stressors other than usual life stressors.   LKW: Essentially not been feeling well since yesterday when she started having chest pain so greater than 24 hours since last known well but the facial neurological symptoms worsened at around 10 PM. IV thrombolysis given?: no, outside the window Premorbid modified Rankin scale (mRS): 2-mostly due  to musculoskeletal issues   ROS: Full ROS was performed and is negative except as noted in the HPI.   Past Medical History:  Diagnosis Date   Acute, but ill-defined, cerebrovascular disease    Arthritis    Asthma    Asthma, mild intermittent 03/21/2019   Chronic pain of left knee 09/07/2018   CVA (cere[facility] vascular accident) [facility])    CVA (cerebral vascular accident) ([place])    Age 16   Dyspnea on exertion 09/18/2019   Eczema    Endometriosis, site unspecified    History of migraine headaches    Insomnia 08/30/2019   Late effect of cerebrovascular accident (CVA) 09/18/2019   Nocturnal hypoxemia 05/10/2020   Obstructive sleep apnea 09/09/2010   PSG from 05/06/10>>AHI 9.6, SpO2 low 77% from [place].   CPAP titration 07/16/10>>CPAP 8 cm from [place].    OSA (obstructive sleep apnea)    Palpitations 09/18/2019   S/P left knee arthroscopy 05/08/2019   Type 2 diabetes mellitus without complication, without long-term current use of insulin ([place]) 09/18/2019   Urticaria     Family History  Problem Relation Age of Onset   Diabetes Father    Hypertension Father    Diabetes Brother        multiple   Hypertension Mother    Hypertension Brother    Breast cancer Maternal Aunt    Heart disease Maternal Grandfather    Heart disease Paternal Grandfather    Colon cancer Cousin    Pancreatic cancer Maternal Grandmother    Social History:   reports that she has never smoked. She has never used  smokeless tobacco. No history on file for alcohol use and drug use.  Medications  Current Facility-Administered Medications:    diphenhydrAMINE (BENADRYL) injection 25 mg, 25 mg, Intravenous, Once, Amie Portland, MD   ketorolac (TORADOL) 15 MG/ML injection 15 mg, 15 mg, Intravenous, Once, Amie Portland, MD   ondansetron [facility]) injection 4 mg, 4 mg, Intravenous, Once, Amie Portland, MD  Current Outpatient Medications:    albuterol (VENTOLIN HFA) 108 (90 Base) MCG/ACT inhaler, Inhale 2 puffs into the  lungs every 4 (four) hours as needed. (Patient taking differently: Inhale 2 puffs into the lungs every 4 (four) hours as needed for wheezing or shortness of breath.), Disp: 34 g, Rfl: 1   aspirin EC 81 MG tablet, Take 81 mg by mouth daily. Swallow whole., Disp: , Rfl:    Budeson-Glycopyrrol-Formoterol (BREZTRI AEROSPHERE) 160-9-4.8 MCG/ACT AERO, Inhale 2 puffs into the lungs 2 (two) times daily., Disp: 10.7 g, Rfl: 0   budesonide-formoterol (SYMBICORT) 160-4.5 MCG/ACT inhaler, Inhale 2 puffs into the lungs 2 (two) times daily., Disp: , Rfl:    cetirizine (ZYRTEC) 10 MG tablet, Take 10 mg by mouth daily., Disp: , Rfl:    citalopram (CELEXA) 40 MG tablet, Take 40 mg by mouth daily. Takes 1 tablet at bedtime., Disp: , Rfl:    EPINEPHrine 0.3 mg/0.3 mL IJ SOAJ injection, Use as directed for life-threatening allergic reaction. (Patient taking differently: Inject 0.3 mg into the muscle as needed for anaphylaxis. Use as directed for life-threatening allergic reaction.), Disp: 2 each, Rfl: 3   furosemide (LASIX) 20 MG tablet, Take 20 mg by mouth as needed for fluid or edema., Disp: , Rfl:    hydrOXYzine (VISTARIL) 100 MG capsule, Take 100 mg by mouth as needed for anxiety., Disp: , Rfl:    ibuprofen (ADVIL) 800 MG tablet, Take 800 mg by mouth every 8 (eight) hours as needed for mild pain or moderate pain., Disp: , Rfl:    JARDIANCE 10 MG TABS tablet, Take 25 mg by mouth daily., Disp: , Rfl:    Melatonin 1 MG TABS, Take 50 mg by mouth at bedtime., Disp: , Rfl:    metoprolol succinate (TOPROL-XL) 25 MG 24 hr tablet, Take 50 mg by mouth daily. Patient takes 1 tablet every day and then taking a tablet and a half every other day, Disp: , Rfl:    metoprolol succinate (TOPROL-XL) 50 MG 24 hr tablet, Take 1 tablet (50 mg total) by mouth daily. Take with or immediately following a meal., Disp: 90 tablet, Rfl: 3   mirabegron ER (MYRBETRIQ) 50 MG TB24 tablet, Take 1 tablet by mouth daily., Disp: , Rfl:    montelukast  (SINGULAIR) 10 MG tablet, Take 10 mg by mouth at bedtime., Disp: , Rfl:    rosuvastatin (CRESTOR) 20 MG tablet, Take 20 mg by mouth at bedtime., Disp: , Rfl:    RYBELSUS 7 MG TABS, Take 1 tablet by mouth daily., Disp: , Rfl:    temazepam (RESTORIL) 30 MG capsule, Take 1 capsule (30 mg total) by mouth at bedtime as needed for sleep., Disp: 30 capsule, Rfl: 5  Exam: Current vital signs: BP (!) 151/96 (BP Location: Right Arm)   Pulse (!) 127   Temp 98.2 F (36.8 C) (Oral)   Resp (!) 29   Ht 5\' 3"  (1.6 m)   Wt 110.8 kg   LMP  (LMP Unknown)   SpO2 100%   BMI 43.27 kg/m  Vital signs in last 24 hours: Temp:  [98.2 F (36.8 C)-98.9  F (37.2 C)] 98.2 F (36.8 C) (01/25 2211) Pulse Rate:  [119-127] 127 (01/25 2211) Resp:  [19-29] 29 (01/25 2211) BP: (138-151)/(81-96) 151/96 (01/25 2211) SpO2:  [100 %] 100 % (01/25 2211) Weight:  [110.8 kg] 110.8 kg (01/25 2257) General: Awake alert in some distress due to headache HEENT: Normocephalic atraumatic, poor dentition with multiple missing teeth. CVS: Regular rate rhythm Abdomen nondistended nontender Neurological exam Awake alert oriented x 3 No dysarthria No aphasia Cranial nerves II to XII intact Motor examination with mild weakness in the right upper and lower extremity which with coaching had no drift.  Left upper and lower extremity full strength. Sensation diminished on the right side with a sharp cut off in the midline. Coordination essentially with no deficits although slow to perform NIH stroke scale-1 for sensory.   Labs I have reviewed labs in epic and the results pertinent to this consultation are:  CBC    Component Value Date/Time   WBC 15.1 (H) 07/08/2022 1831   RBC 5.17 (H) 07/08/2022 1831   HGB 15.6 (H) 07/08/2022 2227   HCT 46.0 07/08/2022 2227   PLT 317 07/08/2022 1831   MCV 87.4 07/08/2022 1831   MCH 29.2 07/08/2022 1831   MCHC 33.4 07/08/2022 1831   RDW 14.0 07/08/2022 1831   LYMPHSABS 3.4 07/08/2022 1831    MONOABS 1.0 07/08/2022 1831   EOSABS 1.1 (H) 07/08/2022 1831   BASOSABS 0.1 07/08/2022 1831    CMP     Component Value Date/Time   NA 138 07/08/2022 2227   K 3.7 07/08/2022 2227   CL 104 07/08/2022 2227   CO2 20 (L) 07/08/2022 2218   GLUCOSE 128 (H) 07/08/2022 2227   BUN 21 (H) 07/08/2022 2227   CREATININE 0.70 07/08/2022 2227   CALCIUM 9.9 07/08/2022 2218   PROT 8.4 (H) 07/08/2022 2218   ALBUMIN 4.1 07/08/2022 2218   AST 53 (H) 07/08/2022 2218   ALT 46 (H) 07/08/2022 2218   ALKPHOS 136 (H) 07/08/2022 2218   BILITOT 1.1 07/08/2022 2218   GFRNONAA >60 07/08/2022 2218   Imaging I have reviewed the images obtained:  CT-head-no acute changes.  Aspects 10  Assessment:  62 year old with above past medical history presenting to the emergency room for evaluation of chest pain that has been occurring since yesterday along with a headache and some right-sided facial pain, with acute worsening of right-sided facial numbness and right body numbness that started while waiting in the emergency room. Examination is consistent with some sensory changes on the right in comparison to the left but her symptoms have been present to some extent since yesterday when she started with chest pain and headache and hence she remains outside the window.  Also her symptoms are very mild to treat. Examination was not consistent with LVO but given her prior risk factors, I wanted to get emergent vessel imaging.  A line was obtained after great difficulty but infiltrated and CT angio images could not be obtained. 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  Recommendations: No bleed on CT head.  Will give her migraine cocktail Toradol/Benadryl/Zofran.  She is allergic to Reglan and Compazine. Stat MRI without contrast of the head.  If negative, no further inpatient neurological workup. If the MRI shows a stroke, she may need admission for inpatient stroke  evaluation. Plan was discussed with Dr. Posey Rea in the emergency department  -- Milon Dikes, MD Neurologist [facility] Pager: [phone]

## 2022-07-08 NOTE — ED Notes (Signed)
Pt's IV infiltrated after CTA, hot pack applied and arm elevated.

## 2022-07-08 NOTE — ED Notes (Signed)
PA evaluating patient at triage at this time , family concerned brief right facial droop with numbness while at the waiting area.

## 2022-07-08 NOTE — ED Notes (Signed)
Arrived in ct.  

## 2022-07-09 ENCOUNTER — Emergency Department (HOSPITAL_COMMUNITY): Payer: 59

## 2022-07-09 DIAGNOSIS — R29818 Other symptoms and signs involving the nervous system: Secondary | ICD-10-CM | POA: Diagnosis not present

## 2022-07-09 DIAGNOSIS — I6782 Cerebral ischemia: Secondary | ICD-10-CM | POA: Diagnosis not present

## 2022-07-09 DIAGNOSIS — G43909 Migraine, unspecified, not intractable, without status migrainosus: Secondary | ICD-10-CM

## 2022-07-09 DIAGNOSIS — I639 Cerebral infarction, unspecified: Secondary | ICD-10-CM | POA: Diagnosis not present

## 2022-07-09 MED ORDER — DROPERIDOL 2.5 MG/ML IJ SOLN: 1.2500 mg | Freq: Once | INTRAMUSCULAR | Status: DC

## 2022-07-09 MED ORDER — LORAZEPAM 2 MG/ML IJ SOLN
1.0000 mg | Freq: Once | INTRAMUSCULAR | Status: AC
  Administered 2022-07-09: 1 mg via INTRAVENOUS
  Filled 2022-07-09: qty 1

## 2022-07-09 MED ORDER — HALOPERIDOL LACTATE 5 MG/ML IJ SOLN
2.0000 mg | Freq: Once | INTRAMUSCULAR | Status: AC
  Administered 2022-07-09: 2 mg via INTRAVENOUS
  Filled 2022-07-09: qty 1

## 2022-07-09 MED ORDER — DROPERIDOL 2.5 MG/ML IJ SOLN
1.2500 mg | Freq: Once | INTRAMUSCULAR | Status: DC
  Filled 2022-07-09: qty 2

## 2022-07-09 MED ORDER — METOPROLOL SUCCINATE ER 25 MG PO TB24
50.0000 mg | ORAL_TABLET | Freq: Every day | ORAL | Status: DC
  Administered 2022-07-09: 50 mg via ORAL
  Filled 2022-07-09: qty 2

## 2022-07-09 MED ORDER — LACTATED RINGERS IV BOLUS
1000.0000 mL | Freq: Once | INTRAVENOUS | Status: AC
  Administered 2022-07-09: 1000 mL via INTRAVENOUS

## 2022-07-09 MED ORDER — GABAPENTIN 100 MG PO CAPS
100.0000 mg | ORAL_CAPSULE | Freq: Once | ORAL | Status: AC
  Administered 2022-07-09: 100 mg via ORAL
  Filled 2022-07-09: qty 1

## 2022-07-09 MED ORDER — OXYCODONE HCL 5 MG PO TABS
5.0000 mg | ORAL_TABLET | Freq: Once | ORAL | Status: AC
  Administered 2022-07-09: 5 mg via ORAL
  Filled 2022-07-09: qty 1

## 2022-07-09 NOTE — ED Notes (Signed)
Patient verbalizes understanding of d/c instructions. Opportunities for questions and answers were provided. Pt d/c from ED and wheeled to lobby with daughters.

## 2022-07-09 NOTE — ED Notes (Signed)
MRI notified RN that pt is unable to do scan d/t claustrophobia. Rory Percy, MD notified and verbal ordered Ativan.

## 2022-07-09 NOTE — ED Notes (Signed)
Patient transported to MRI 

## 2022-07-13 DIAGNOSIS — G43909 Migraine, unspecified, not intractable, without status migrainosus: Secondary | ICD-10-CM | POA: Diagnosis not present

## 2022-07-13 DIAGNOSIS — R7989 Other specified abnormal findings of blood chemistry: Secondary | ICD-10-CM | POA: Diagnosis not present

## 2022-07-14 DIAGNOSIS — Z713 Dietary counseling and surveillance: Secondary | ICD-10-CM | POA: Diagnosis not present

## 2022-07-15 DIAGNOSIS — G43909 Migraine, unspecified, not intractable, without status migrainosus: Secondary | ICD-10-CM | POA: Diagnosis not present

## 2022-07-15 DIAGNOSIS — E785 Hyperlipidemia, unspecified: Secondary | ICD-10-CM | POA: Diagnosis not present

## 2022-07-15 DIAGNOSIS — E1169 Type 2 diabetes mellitus with other specified complication: Secondary | ICD-10-CM | POA: Diagnosis not present

## 2022-07-15 DIAGNOSIS — J449 Chronic obstructive pulmonary disease, unspecified: Secondary | ICD-10-CM | POA: Diagnosis not present

## 2022-07-16 ENCOUNTER — Encounter: Payer: Self-pay | Admitting: Neurology

## 2022-07-16 ENCOUNTER — Ambulatory Visit: Payer: Medicare Other | Admitting: Neurology

## 2022-07-20 DIAGNOSIS — R7989 Other specified abnormal findings of blood chemistry: Secondary | ICD-10-CM | POA: Diagnosis not present

## 2022-07-20 DIAGNOSIS — R932 Abnormal findings on diagnostic imaging of liver and biliary tract: Secondary | ICD-10-CM | POA: Diagnosis not present

## 2022-07-20 DIAGNOSIS — R945 Abnormal results of liver function studies: Secondary | ICD-10-CM | POA: Diagnosis not present

## 2022-07-20 DIAGNOSIS — Z9049 Acquired absence of other specified parts of digestive tract: Secondary | ICD-10-CM | POA: Diagnosis not present

## 2022-07-28 DIAGNOSIS — Z713 Dietary counseling and surveillance: Secondary | ICD-10-CM | POA: Diagnosis not present

## 2022-07-28 DIAGNOSIS — E1169 Type 2 diabetes mellitus with other specified complication: Secondary | ICD-10-CM | POA: Diagnosis not present

## 2022-07-28 DIAGNOSIS — R7989 Other specified abnormal findings of blood chemistry: Secondary | ICD-10-CM | POA: Diagnosis not present

## 2022-07-31 DIAGNOSIS — G43809 Other migraine, not intractable, without status migrainosus: Secondary | ICD-10-CM | POA: Diagnosis not present

## 2022-07-31 DIAGNOSIS — Z882 Allergy status to sulfonamides status: Secondary | ICD-10-CM | POA: Diagnosis not present

## 2022-07-31 DIAGNOSIS — Z7902 Long term (current) use of antithrombotics/antiplatelets: Secondary | ICD-10-CM | POA: Diagnosis not present

## 2022-07-31 DIAGNOSIS — Z79891 Long term (current) use of opiate analgesic: Secondary | ICD-10-CM | POA: Diagnosis not present

## 2022-07-31 DIAGNOSIS — Z20822 Contact with and (suspected) exposure to covid-19: Secondary | ICD-10-CM | POA: Diagnosis not present

## 2022-07-31 DIAGNOSIS — Z888 Allergy status to other drugs, medicaments and biological substances status: Secondary | ICD-10-CM | POA: Diagnosis not present

## 2022-07-31 DIAGNOSIS — R1110 Vomiting, unspecified: Secondary | ICD-10-CM | POA: Diagnosis not present

## 2022-07-31 DIAGNOSIS — R197 Diarrhea, unspecified: Secondary | ICD-10-CM | POA: Diagnosis not present

## 2022-07-31 DIAGNOSIS — Z880 Allergy status to penicillin: Secondary | ICD-10-CM | POA: Diagnosis not present

## 2022-07-31 DIAGNOSIS — Z7951 Long term (current) use of inhaled steroids: Secondary | ICD-10-CM | POA: Diagnosis not present

## 2022-07-31 DIAGNOSIS — R55 Syncope and collapse: Secondary | ICD-10-CM | POA: Diagnosis not present

## 2022-07-31 DIAGNOSIS — Z7982 Long term (current) use of aspirin: Secondary | ICD-10-CM | POA: Diagnosis not present

## 2022-07-31 DIAGNOSIS — Z79899 Other long term (current) drug therapy: Secondary | ICD-10-CM | POA: Diagnosis not present

## 2022-07-31 DIAGNOSIS — K0889 Other specified disorders of teeth and supporting structures: Secondary | ICD-10-CM | POA: Diagnosis not present

## 2022-08-01 DIAGNOSIS — G43809 Other migraine, not intractable, without status migrainosus: Secondary | ICD-10-CM | POA: Diagnosis not present

## 2022-08-04 DIAGNOSIS — L089 Local infection of the skin and subcutaneous tissue, unspecified: Secondary | ICD-10-CM | POA: Diagnosis not present

## 2022-08-04 DIAGNOSIS — W5503XA Scratched by cat, initial encounter: Secondary | ICD-10-CM | POA: Diagnosis not present

## 2022-08-05 ENCOUNTER — Encounter: Payer: Self-pay | Admitting: Cardiology

## 2022-08-05 ENCOUNTER — Ambulatory Visit: Payer: 59 | Attending: Cardiology | Admitting: Cardiology

## 2022-08-05 VITALS — BP 110/58 | HR 89 | Ht 64.0 in | Wt 239.8 lb

## 2022-08-05 DIAGNOSIS — R0609 Other forms of dyspnea: Secondary | ICD-10-CM

## 2022-08-05 DIAGNOSIS — I6930 Unspecified sequelae of cerebral infarction: Secondary | ICD-10-CM

## 2022-08-05 DIAGNOSIS — R002 Palpitations: Secondary | ICD-10-CM

## 2022-08-05 NOTE — Patient Instructions (Signed)

## 2022-08-05 NOTE — Progress Notes (Signed)
Cardiology Office Note:    Date:  08/05/2022   ID:  Veronica Leblanc, DOB 1960-12-24, MRN [id number]  PCP:  Veronica Leblanc, Veronica Argue, MD  Cardiologist:  Veronica Campus, MD    Referring MD: Veronica Leblanc, Veronica Leblanc, *   No chief complaint on file.   History of Present Illness:    Veronica Leblanc is a 62 y.o. female with past medical history significant for morbid obesity, obstructive sleep apnea, type 2 diabetes, dyslipidemia, palpitations she was referred to [place] for that reason.  She was identified to have PVCs APC she was put on small dose of beta-blocker with good response.  Recently she end up going to the emergency room she had some severe headache also end up having neurological episodes.  Conclusion is that she did have complex migraine.  She is awaiting appointment with neurologist to follow-up on that.  Another issue that she brought up today is the fact that she does have multiple teeth scheduled to be extracted (can be done under general esthesia.  She did have a stress test done recently in the hospital that I had a chance to review which was negative for exercise-induced myocardial ischemia Overall cardiac wise doing well.  Denies have any cardiac complaints.  No palpitations dizziness swelling of lower extremities  Past Medical History:  Diagnosis Date   Acute, but ill-defined, cerebrovascular disease    Arthritis    Asthma    Asthma, mild intermittent 03/21/2019   Chronic pain of left knee 09/07/2018   CVA (cerebral vascular accident) [facility])    CVA (cerebral vascular accident) ([place])    Age 74   Dyspnea on exertion 09/18/2019   Eczema    Endometriosis, site unspecified    History of migraine headaches    Insomnia 08/30/2019   Late effect of cerebrovascular accident (CVA) 09/18/2019   Nocturnal hypoxemia 05/10/2020   Obstructive sleep apnea 09/09/2010   PSG from 05/06/10>>AHI 9.6, SpO2 low 77% from [place].   CPAP titration 07/16/10>>CPAP 8 cm from [place].    OSA  (obstructive sleep apnea)    Palpitations 09/18/2019   S/P left knee arthroscopy 05/08/2019   Type 2 diabetes mellitus without complication, without long-term current use of insulin ([place]) 09/18/2019   Urticaria     Past Surgical History:  Procedure Laterality Date   APPENDECTOMY     CESAREAN SECTION     CHOLECYSTECTOMY     ganglionectomy     c2/c3 right and left side   NECK SURGERY     5 times   presacral neurectomy     SALPINGOOPHORECTOMY     left   TOTAL ABDOMINAL HYSTERECTOMY      Current Medications: Current Meds  Medication Sig   albuterol (VENTOLIN HFA) 108 (90 Base) MCG/ACT inhaler Inhale 2 puffs into the lungs every 4 (four) hours as needed. (Patient taking differently: Inhale 2 puffs into the lungs every 4 (four) hours as needed for wheezing or shortness of breath.)   aspirin EC 81 MG tablet Take 81 mg by mouth daily. Swallow whole.   budesonide-formoterol (SYMBICORT) 160-4.5 MCG/ACT inhaler Inhale 2 puffs into the lungs 2 (two) times daily.   cetirizine (ZYRTEC) 10 MG tablet Take 10 mg by mouth daily.   citalopram (CELEXA) 40 MG tablet Take 40 mg by mouth daily. Takes 1 tablet at bedtime.   EPINEPHrine 0.3 mg/0.3 mL IJ SOAJ injection Use as directed for life-threatening allergic reaction. (Patient taking differently: Inject 0.3 mg into the muscle as needed  for anaphylaxis. Use as directed for life-threatening allergic reaction.)   furosemide (LASIX) 20 MG tablet Take 20 mg by mouth as needed for fluid or edema.   hydrOXYzine (VISTARIL) 100 MG capsule Take 100 mg by mouth as needed for anxiety.   ibuprofen (ADVIL) 800 MG tablet Take 800 mg by mouth every 8 (eight) hours as needed for mild pain or moderate pain.   JARDIANCE 10 MG TABS tablet Take 25 mg by mouth daily.   Melatonin 10 MG TABS Take 60 mg by mouth at bedtime.   metoprolol succinate (TOPROL-XL) 25 MG 24 hr tablet Take 50 mg by mouth daily. Patient takes 1 tablet every day and then taking a tablet and a half every  other day   metoprolol succinate (TOPROL-XL) 50 MG 24 hr tablet Take 1 tablet (50 mg total) by mouth daily. Take with or immediately following a meal.   mirabegron ER (MYRBETRIQ) 50 MG TB24 tablet Take 1 tablet by mouth daily.   montelukast (SINGULAIR) 10 MG tablet Take 10 mg by mouth at bedtime.   MOUNJARO 7.5 MG/0.5ML Pen Inject 7.5 mg into the skin once a week.   rosuvastatin (CRESTOR) 20 MG tablet Take 20 mg by mouth at bedtime.   temazepam (RESTORIL) 30 MG capsule Take 1 capsule (30 mg total) by mouth at bedtime as needed for sleep.   topiramate (TOPAMAX) 50 MG tablet Take 50 mg by mouth at bedtime.     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  Tobacco Use   Smoking status: Never   Smokeless tobacco: Never  Vaping Use   Vaping Use: Never used  Substance and Sexual Activity   Alcohol use: Not on file   Drug use: Not on file   Sexual activity: Not on file  Other Topics Concern   Not on file  Social History Narrative   Not on file   Social Determinants of Health   Financial Resource Strain: Not on file  Food Insecurity: Not on file  Transportation Needs: Not on file  Physical Activity: Not on file  Stress: Not on file  Social Connections: Not on file     Family History: The patient's family history includes Breast cancer in her maternal aunt; Colon cancer in her cousin; Diabetes in her brother and father; Heart disease in her maternal grandfather and  paternal grandfather; Hypertension in her brother, father, and mother; Pancreatic cancer in her maternal grandmother. ROS:   Please see the history of present illness.    All 14 point review of systems negative except as described per history of present illness  EKGs/Labs/Other Studies Reviewed:      Recent Labs: 07/08/2022: ALT 46; BUN 21; Creatinine, Ser 0.70; Hemoglobin 15.6; Platelets 317; Potassium 3.7; Sodium 138  Recent Lipid Panel No results found for: "CHOL", "TRIG", "HDL", "CHOLHDL", "VLDL", "LDLCALC", "LDLDIRECT"  Physical Exam:    VS:  BP (!) 110/58 (BP Location: Right Arm, Patient Position: Sitting)   Pulse 89   Ht 5' 4"$  (1.626 m)   Wt 239 lb  12.8 oz (108.8 kg)   LMP  (LMP Unknown)   SpO2 98%   BMI 41.16 kg/m     Wt Readings from Last 3 Encounters:  08/05/22 239 lb 12.8 oz (108.8 kg)  07/08/22 244 lb 4.3 oz (110.8 kg)  02/02/22 259 lb 9.6 oz (117.8 kg)     GEN:  Well nourished, well developed in no acute distress HEENT: Normal NECK: No JVD; No carotid bruits LYMPHATICS: No lymphadenopathy CARDIAC: RRR, no murmurs, no rubs, no gallops RESPIRATORY:  Clear to auscultation without rales, wheezing or rhonchi  ABDOMEN: Soft, non-tender, non-distended MUSCULOSKELETAL:  No edema; No deformity  SKIN: Warm and dry LOWER EXTREMITIES: no swelling NEUROLOGIC:  Alert and oriented x 3 PSYCHIATRIC:  Normal affect   ASSESSMENT:    1. Palpitations   2. Late effect of cerebrovascular accident (CVA)   3. Dyspnea on exertion    PLAN:    In order of problems listed above:  Palpitations.  Those are successfully suppressed with appropriate medications which I will continue. Late effect of CVA.  No new problems. Dyspnea exertion denies having any. Cardiovascular evaluation before elective surgery which apparently supposed to be done under general anesthesia.  Recent stress test reviewed showing no evidence of ischemia.  It will be safe to proceed with no  reservations.   Medication Adjustments/Labs and Tests Ordered: Current medicines are reviewed at length with the patient today.  Concerns regarding medicines are outlined above.  Orders Placed This Encounter  Procedures   EKG 12-Lead   Medication changes: No orders of the defined types were placed in this encounter.   Signed, Park Liter, MD, [facility] 08/05/2022 4:58 PM    [place]

## 2022-08-09 NOTE — H&P (Signed)
  Patient: Veronica Leblanc   PID: [id number]  DOB: 11/24/1960  SEX: Female   Patient referred by  DDS for extraction all remaining  teeth  CC: Pain lower right  Past Medical History:  Asthma, Diabetes, High Blood Pressure, Sinus Issues, DOE, Bruise Easily, Stroke, Arthritis, Morbid Obesity, OSA with CPAP    Medications: Mounjaro, Jardiance, ProAir, Montelukast, Metoprolol, Symbicort, Gabapentin, Ondansetron, Rosuvastatin, Celexa, Myrbetriq, Furosemide, Estradiol, epinephrine, Hydroxyzine, Nystatin, Melatonin, Aspirin    Allergies:     Sulfa, Stadol, Imitrex, Phenergan, Darvocet    Surgeries:   gallbladder removal, Hysterectomy, C Section, Neck     Social History       Smoking:            Alcohol: Drug use:                             Exam: BMI 44. Multiple residual roots # 7, 8, 9, 10, 21, 22, 23, 24, 25, 26, 27, 28, 29, 30,   No purulence, edema, fluctuance, trismus. Oral cancer screening negative. Pharynx clear. No lymphadenopathy.  Panorex: Multiple residual roots # 7, 8, 9, 10, 21, 22, 23, 24, 25, 26, 27, 28, 29, 30,   Assessment: ASA 3. Non-restorable    # 7, 8, 9, 10, 21, 22, 23, 24, 25, 26, 27, 28, 29, 30,   Plan: Extraction Teeth #  7, 8, 9, 10, 21, 22, 23, 24, 25, 26, 27, 28, 29, 30, Alveoloplasty.  Hospital Day surgery.                 Rx: n               Risks and complications explained. Questions answered.   Gae Bon, [name]

## 2022-08-10 DIAGNOSIS — R748 Abnormal levels of other serum enzymes: Secondary | ICD-10-CM | POA: Diagnosis not present

## 2022-08-10 DIAGNOSIS — K760 Fatty (change of) liver, not elsewhere classified: Secondary | ICD-10-CM | POA: Diagnosis not present

## 2022-08-12 ENCOUNTER — Ambulatory Visit: Admit: 2022-08-12 | Payer: 59 | Admitting: Oral Surgery

## 2022-08-12 SURGERY — DENTAL RESTORATION/EXTRACTION WITH X-RAY
Anesthesia: General

## 2022-08-13 DIAGNOSIS — E785 Hyperlipidemia, unspecified: Secondary | ICD-10-CM | POA: Diagnosis not present

## 2022-08-13 DIAGNOSIS — E1169 Type 2 diabetes mellitus with other specified complication: Secondary | ICD-10-CM | POA: Diagnosis not present

## 2022-08-13 DIAGNOSIS — G43909 Migraine, unspecified, not intractable, without status migrainosus: Secondary | ICD-10-CM | POA: Diagnosis not present

## 2022-08-13 DIAGNOSIS — J449 Chronic obstructive pulmonary disease, unspecified: Secondary | ICD-10-CM | POA: Diagnosis not present

## 2022-08-18 DIAGNOSIS — E1159 Type 2 diabetes mellitus with other circulatory complications: Secondary | ICD-10-CM | POA: Diagnosis not present

## 2022-08-18 DIAGNOSIS — Z713 Dietary counseling and surveillance: Secondary | ICD-10-CM | POA: Diagnosis not present

## 2022-08-19 ENCOUNTER — Ambulatory Visit: Payer: 59 | Admitting: Neurology

## 2022-08-24 ENCOUNTER — Other Ambulatory Visit: Payer: Self-pay | Admitting: Internal Medicine

## 2022-08-25 ENCOUNTER — Telehealth: Payer: Self-pay | Admitting: Internal Medicine

## 2022-08-25 NOTE — Telephone Encounter (Signed)
Veronica Leblanc states patient needs refill for Temazepam. Pharmacy is [facility]. Veronica Leblanc phone number is [phone].

## 2022-08-25 NOTE — Telephone Encounter (Signed)
Please advise on refill request  Allergies  Allergen Reactions   Butorphanol Hives   Metoclopramide Hives   Other Other (See Comments) and Swelling    Locks jaw Locks jaw   Amitriptyline Hcl Hives and Swelling   Cefazolin Hives and Swelling   Ceftriaxone Sodium Hives and Swelling   Ciprofloxacin Hives and Swelling   Darvocet [Propoxyphene N-Acetaminophen] Hives and Swelling   Penicillins Hives and Swelling   Prochlorperazine Edisylate Hives and Swelling   Promethazine Hcl     Jaw locks up   Propoxyphene Hives and Swelling   Sulfamethoxazole Swelling   Sulfonamide Derivatives Hives and Swelling   Sumatriptan Hives and Swelling     Current Outpatient Medications:    albuterol (VENTOLIN HFA) 108 (90 Base) MCG/ACT inhaler, Inhale 2 puffs into the lungs every 4 (four) hours as needed. (Patient taking differently: Inhale 2 puffs into the lungs every 4 (four) hours as needed for wheezing or shortness of breath.), Disp: 34 g, Rfl: 1   aspirin EC 81 MG tablet, Take 81 mg by mouth daily. Swallow whole., Disp: , Rfl:    budesonide-formoterol (SYMBICORT) 160-4.5 MCG/ACT inhaler, Inhale 2 puffs into the lungs 2 (two) times daily., Disp: , Rfl:    cetirizine (ZYRTEC) 10 MG tablet, Take 10 mg by mouth daily., Disp: , Rfl:    citalopram (CELEXA) 40 MG tablet, Take 40 mg by mouth daily. Takes 1 tablet at bedtime., Disp: , Rfl:    EPINEPHrine 0.3 mg/0.3 mL IJ SOAJ injection, Use as directed for life-threatening allergic reaction. (Patient taking differently: Inject 0.3 mg into the muscle as needed for anaphylaxis. Use as directed for life-threatening allergic reaction.), Disp: 2 each, Rfl: 3   furosemide (LASIX) 20 MG tablet, Take 20 mg by mouth as needed for fluid or edema., Disp: , Rfl:    hydrOXYzine (VISTARIL) 100 MG capsule, Take 100 mg by mouth as needed for anxiety., Disp: , Rfl:    ibuprofen (ADVIL) 800 MG tablet, Take 800 mg by mouth every 8 (eight) hours as needed for mild pain or moderate  pain., Disp: , Rfl:    JARDIANCE 10 MG TABS tablet, Take 25 mg by mouth daily., Disp: , Rfl:    Melatonin 10 MG TABS, Take 60 mg by mouth at bedtime., Disp: , Rfl:    metoprolol succinate (TOPROL-XL) 25 MG 24 hr tablet, Take 50 mg by mouth daily. Patient takes 1 tablet every day and then taking a tablet and a half every other day, Disp: , Rfl:    metoprolol succinate (TOPROL-XL) 50 MG 24 hr tablet, Take 1 tablet (50 mg total) by mouth daily. Take with or immediately following a meal., Disp: 90 tablet, Rfl: 3   mirabegron ER (MYRBETRIQ) 50 MG TB24 tablet, Take 1 tablet by mouth daily., Disp: , Rfl:    montelukast (SINGULAIR) 10 MG tablet, Take 10 mg by mouth at bedtime., Disp: , Rfl:    MOUNJARO 7.5 MG/0.5ML Pen, Inject 7.5 mg into the skin once a week., Disp: , Rfl:    rosuvastatin (CRESTOR) 20 MG tablet, Take 20 mg by mouth at bedtime., Disp: , Rfl:    temazepam (RESTORIL) 30 MG capsule, Take 1 capsule (30 mg total) by mouth at bedtime as needed for sleep., Disp: 30 capsule, Rfl: 5   topiramate (TOPAMAX) 50 MG tablet, Take 50 mg by mouth at bedtime., Disp: , Rfl:

## 2022-08-26 NOTE — Telephone Encounter (Signed)
Temazepam refilled.

## 2022-08-26 NOTE — Telephone Encounter (Signed)
Reviewed patient's chart. Temazepam was sent in earlier today to [facility].   Will close encounter.

## 2022-08-30 NOTE — H&P (Signed)
  Patient: Veronica Leblanc   PID: [id number]  DOB: July 15, 1960  SEX: Female   Patient referred by  DDS for extraction all remaining  teeth  CC: Pain lower right  Past Medical History:  Asthma, Diabetes, High Blood Pressure, Sinus Issues, DOE, Bruise Easily, Stroke, Arthritis, Morbid Obesity, OSA with CPAP    Medications: Mounjaro, Jardiance, ProAir, Montelukast, Metoprolol, Symbicort, Gabapentin, Ondansetron, Rosuvastatin, Celexa, Myrbetriq, Furosemide, Estradiol, epinephrine, Hydroxyzine, Nystatin, Melatonin, Aspirin    Allergies:     Sulfa, Stadol, Imitrex, Phenergan, Darvocet    Surgeries:   gallbladder removal, Hysterectomy, C Section, Neck     Social History       Smoking:            Alcohol: Drug use:                             Exam: BMI 44. Multiple residual roots # 7, 8, 9, 10, 21, 22, 23, 24, 25, 26, 27, 28, 29, 30,   No purulence, edema, fluctuance, trismus. Oral cancer screening negative. Pharynx clear. No lymphadenopathy.  Panorex: Multiple residual roots # 7, 8, 9, 10, 21, 22, 23, 24, 25, 26, 27, 28, 29, 30,   Assessment: ASA 3. Non-restorable    # 7, 8, 9, 10, 21, 22, 23, 24, 25, 26, 27, 28, 29, 30,   Plan: Extraction Teeth #  7, 8, 9, 10, 21, 22, 23, 24, 25, 26, 27, 28, 29, 30, Alveoloplasty.  Hospital Day surgery.                 Rx: n               Risks and complications explained. Questions answered.   Matheson Vandehei M. Keandre Linden, [name]  

## 2022-09-01 ENCOUNTER — Encounter (HOSPITAL_COMMUNITY): Payer: Self-pay | Admitting: Oral Surgery

## 2022-09-01 ENCOUNTER — Other Ambulatory Visit: Payer: Self-pay

## 2022-09-01 NOTE — Progress Notes (Addendum)
PCP - [facility] Cardiologist - Dr. Raliegh Ip in [place]  PPM/ICD - Denies  Chest x-ray - 07/08/2021 EKG - 07/09/2022 Stress Test -  ECHO - 07/31/2021 Cardiac Cath -   Diagnosed with sleep apnea, uses CPAP  Type II Diabetic  Fasting Blood Sugar -  100-120 Checks Blood Sugar  every few weeks  Blood Thinner Instructions: Denies Aspirin Instructions: follow surgeon instructions  ERAS Protcol - No, NPO  COVID TEST- Denies  Anesthesia review: Yes, HTN, DM, Stroke, Asthma, OSA with CPAP  Patient verbally denies any shortness of breath, fever, cough and chest pain during phone call   -------------  SDW INSTRUCTIONS given:  Your procedure is scheduled on Thursday September 02, 2022.  Report to [facility] Main Entrance "A" at 0800 A.M., and check in at the Admitting office.  Call this number if you have problems the morning of surgery:  [phone]   Remember:  Do not eat or drink after midnight the night before your surgery   Take these medicines the morning of surgery with A SIP OF WATER  Symbicort, zyrtec, gabapentin, metoprolol, mirabegron  As needed: Albuterol, hydroxyzine  HOLD Monjaro, last dose 08/01/2022  HOLD Jardiance 72 hours prior to surgery. Last dose 08/29/2022  As of today, STOP taking any Aspirin (unless otherwise instructed by your surgeon) Aleve, Naproxen, Ibuprofen, Motrin, Advil, Goody's, BC's, all herbal medications, fish oil, and all vitamins.                      Do not wear jewelry, make up, or nail polish            Do not wear lotions, powders, perfumes or deodorant.            Do not shave 48 hours prior to surgery.             Do not bring valuables to the hospital.            [facility] is not responsible for any belongings or valuables.  Do NOT Smoke (Tobacco/Vaping) 24 hours prior to your procedure If you use a CPAP at night, you may bring all equipment for your overnight stay.   Contacts, glasses, dentures or bridgework may  not be worn into surgery.      For patients admitted to the hospital, discharge time will be determined by your treatment team.   Patients discharged the day of surgery will not be allowed to drive home, and someone needs to stay with them for 24 hours.    Special instructions:   [place]- Preparing For Surgery  Before surgery, you can play an important role. Because skin is not sterile, your skin needs to be as free of germs as possible. You can reduce the number of germs on your skin by washing with DialSoap before surgery.    Oral Hygiene is also important to reduce your risk of infection.  Remember - BRUSH YOUR TEETH THE MORNING OF SURGERY WITH YOUR REGULAR TOOTHPASTE   Please follow these instructions carefully.   Shower the NIGHT BEFORE SURGERY and the MORNING OF SURGERY with DIAL Soap.   Pat yourself dry with a CLEAN TOWEL.  Wear CLEAN PAJAMAS to bed the night before surgery  Place CLEAN SHEETS on your bed the night of your first shower and DO NOT SLEEP WITH PETS.   Day of Surgery: Please shower morning of surgery  Wear Clean/Comfortable clothing the morning of surgery Do not apply  any deodorants/lotions.   Remember to brush your teeth WITH YOUR REGULAR TOOTHPASTE.   Questions were answered. Patient verbalized understanding of instructions.

## 2022-09-02 ENCOUNTER — Ambulatory Visit (HOSPITAL_COMMUNITY): Payer: 59 | Admitting: Anesthesiology

## 2022-09-02 ENCOUNTER — Ambulatory Visit (HOSPITAL_COMMUNITY)
Admission: RE | Admit: 2022-09-02 | Discharge: 2022-09-02 | Disposition: A | Discharge status: Home / Self Care | Payer: 59 | Source: Ambulatory Visit | Attending: Oral Surgery | Admitting: Oral Surgery

## 2022-09-02 ENCOUNTER — Encounter (HOSPITAL_COMMUNITY)
Admission: RE | Disposition: A | Discharge status: Home / Self Care | Payer: Self-pay | Source: Ambulatory Visit | Attending: Oral Surgery

## 2022-09-02 ENCOUNTER — Encounter (HOSPITAL_COMMUNITY): Payer: Self-pay | Admitting: Oral Surgery

## 2022-09-02 ENCOUNTER — Other Ambulatory Visit: Payer: Self-pay

## 2022-09-02 ENCOUNTER — Ambulatory Visit (HOSPITAL_BASED_OUTPATIENT_CLINIC_OR_DEPARTMENT_OTHER): Payer: 59 | Admitting: Anesthesiology

## 2022-09-02 DIAGNOSIS — J45909 Unspecified asthma, uncomplicated: Secondary | ICD-10-CM

## 2022-09-02 DIAGNOSIS — K0530 Chronic periodontitis, unspecified: Secondary | ICD-10-CM | POA: Diagnosis not present

## 2022-09-02 DIAGNOSIS — E119 Type 2 diabetes mellitus without complications: Secondary | ICD-10-CM

## 2022-09-02 DIAGNOSIS — K029 Dental caries, unspecified: Secondary | ICD-10-CM | POA: Diagnosis not present

## 2022-09-02 DIAGNOSIS — Z6841 Body Mass Index (BMI) 40.0 and over, adult: Secondary | ICD-10-CM | POA: Insufficient documentation

## 2022-09-02 DIAGNOSIS — Z8673 Personal history of transient ischemic attack (TIA), and cerebral infarction without residual deficits: Secondary | ICD-10-CM | POA: Insufficient documentation

## 2022-09-02 DIAGNOSIS — Z7984 Long term (current) use of oral hypoglycemic drugs: Secondary | ICD-10-CM | POA: Insufficient documentation

## 2022-09-02 DIAGNOSIS — G4730 Sleep apnea, unspecified: Secondary | ICD-10-CM | POA: Diagnosis not present

## 2022-09-02 HISTORY — DX: Other specified postprocedural states: Z98.890

## 2022-09-02 HISTORY — DX: Other specified postprocedural states: R11.2

## 2022-09-02 HISTORY — PX: TOOTH EXTRACTION: SHX859

## 2022-09-02 HISTORY — DX: Other complications of anesthesia, initial encounter: T88.59XA

## 2022-09-02 LAB — BASIC METABOLIC PANEL
Anion gap: 10 (ref 5–15)
BUN: 9 mg/dL (ref 8–23)
CO2: 25 mmol/L (ref 22–32)
Calcium: 9.6 mg/dL (ref 8.9–10.3)
Chloride: 101 mmol/L (ref 98–111)
Creatinine, Ser: 0.8 mg/dL (ref 0.44–1.00)
GFR, Estimated: >60 mL/min (ref >60)
Glucose, Bld: 140 mg/dL — ABNORMAL HIGH (ref 70–99)
Potassium: 3.5 mmol/L (ref 3.5–5.1)
Sodium: 136 mmol/L (ref 135–145)

## 2022-09-02 LAB — GLUCOSE, CAPILLARY
Glucose-Capillary: 133 mg/dL — ABNORMAL HIGH (ref 70–99)
Glucose-Capillary: 176 mg/dL — ABNORMAL HIGH (ref 70–99)

## 2022-09-02 SURGERY — DENTAL RESTORATION/EXTRACTIONS
Anesthesia: General

## 2022-09-02 MED ORDER — OXYMETAZOLINE HCL 0.05 % NA SOLN
NASAL | Status: DC | PRN
  Administered 2022-09-02: 2 via NASAL

## 2022-09-02 MED ORDER — OXYCODONE HCL 5 MG PO TABS
ORAL_TABLET | ORAL | Status: AC
  Filled 2022-09-02: qty 1

## 2022-09-02 MED ORDER — CLINDAMYCIN HCL 300 MG PO CAPS: 300.0000 mg | ORAL_CAPSULE | Freq: Three times a day (TID) | ORAL | 0 refills | Status: DC

## 2022-09-02 MED ORDER — OXYCODONE HCL 5 MG PO TABS: 5.0000 mg | ORAL_TABLET | Freq: Once | ORAL | Status: AC | PRN

## 2022-09-02 MED ORDER — CLINDAMYCIN PHOSPHATE 900 MG/50ML IV SOLN
900.0000 mg | INTRAVENOUS | Status: AC
  Administered 2022-09-02: 900 mg via INTRAVENOUS
  Filled 2022-09-02: qty 50

## 2022-09-02 MED ORDER — OXYCODONE HCL 5 MG/5ML PO SOLN
ORAL | Status: AC
  Filled 2022-09-02: qty 5

## 2022-09-02 MED ORDER — ONDANSETRON HCL 4 MG/2ML IJ SOLN: 4.0000 mg | Freq: Four times a day (QID) | INTRAMUSCULAR | Status: DC | PRN

## 2022-09-02 MED ORDER — LACTATED RINGERS IV SOLN: INTRAVENOUS | Status: DC

## 2022-09-02 MED ORDER — SUGAMMADEX SODIUM 200 MG/2ML IV SOLN
INTRAVENOUS | Status: DC | PRN
  Administered 2022-09-02: 200 mg via INTRAVENOUS

## 2022-09-02 MED ORDER — SODIUM CHLORIDE 0.9 % IR SOLN
Status: DC | PRN
  Administered 2022-09-02: 250 mL

## 2022-09-02 MED ORDER — FENTANYL CITRATE (PF) 100 MCG/2ML IJ SOLN: 25.0000 ug | INTRAMUSCULAR | Status: DC | PRN

## 2022-09-02 MED ORDER — EPHEDRINE SULFATE-NACL 50-0.9 MG/10ML-% IV SOSY
PREFILLED_SYRINGE | INTRAVENOUS | Status: DC | PRN
  Administered 2022-09-02: 15 mg via INTRAVENOUS

## 2022-09-02 MED ORDER — FENTANYL CITRATE (PF) 250 MCG/5ML IJ SOLN
INTRAMUSCULAR | Status: AC
  Filled 2022-09-02: qty 5

## 2022-09-02 MED ORDER — PHENYLEPHRINE 80 MCG/ML (10ML) SYRINGE FOR IV PUSH (FOR BLOOD PRESSURE SUPPORT)
PREFILLED_SYRINGE | INTRAVENOUS | Status: DC | PRN
  Administered 2022-09-02: 60 ug via INTRAVENOUS
  Administered 2022-09-02: 80 ug via INTRAVENOUS
  Administered 2022-09-02 (×2): 160 ug via INTRAVENOUS

## 2022-09-02 MED ORDER — LIDOCAINE 2% (20 MG/ML) 5 ML SYRINGE
INTRAMUSCULAR | Status: DC | PRN
  Administered 2022-09-02: 60 mg via INTRAVENOUS

## 2022-09-02 MED ORDER — FENTANYL CITRATE (PF) 250 MCG/5ML IJ SOLN
INTRAMUSCULAR | Status: DC | PRN
  Administered 2022-09-02: 50 ug via INTRAVENOUS
  Administered 2022-09-02: 100 ug via INTRAVENOUS

## 2022-09-02 MED ORDER — DEXAMETHASONE SODIUM PHOSPHATE 10 MG/ML IJ SOLN
INTRAMUSCULAR | Status: DC | PRN
  Administered 2022-09-02: 10 mg via INTRAVENOUS

## 2022-09-02 MED ORDER — LIDOCAINE-EPINEPHRINE 2 %-1:100000 IJ SOLN
INTRAMUSCULAR | Status: DC | PRN
  Administered 2022-09-02: 20 mL via INTRADERMAL

## 2022-09-02 MED ORDER — FENTANYL CITRATE (PF) 100 MCG/2ML IJ SOLN
INTRAMUSCULAR | Status: AC
  Filled 2022-09-02: qty 2

## 2022-09-02 MED ORDER — PROPOFOL 10 MG/ML IV BOLUS
INTRAVENOUS | Status: DC | PRN
  Administered 2022-09-02: 200 mg via INTRAVENOUS

## 2022-09-02 MED ORDER — OXYMETAZOLINE HCL 0.05 % NA SOLN
NASAL | Status: AC
  Filled 2022-09-02: qty 30

## 2022-09-02 MED ORDER — OXYCODONE-ACETAMINOPHEN 5-325 MG PO TABS: 1.0000 | ORAL_TABLET | ORAL | 0 refills | Status: DC | PRN

## 2022-09-02 MED ORDER — ONDANSETRON HCL 4 MG/2ML IJ SOLN
INTRAMUSCULAR | Status: DC | PRN
  Administered 2022-09-02: 4 mg via INTRAVENOUS

## 2022-09-02 MED ORDER — CHLORHEXIDINE GLUCONATE 0.12 % MT SOLN
15.0000 mL | OROMUCOSAL | Status: AC
  Administered 2022-09-02: 15 mL via OROMUCOSAL
  Filled 2022-09-02 (×2): qty 15

## 2022-09-02 MED ORDER — OXYCODONE HCL 5 MG/5ML PO SOLN
5.0000 mg | Freq: Once | ORAL | Status: AC | PRN
  Administered 2022-09-02: 5 mg via ORAL

## 2022-09-02 MED ORDER — 0.9 % SODIUM CHLORIDE (POUR BTL) OPTIME
TOPICAL | Status: DC | PRN
  Administered 2022-09-02: 1000 mL

## 2022-09-02 MED ORDER — ROCURONIUM BROMIDE 10 MG/ML (PF) SYRINGE
PREFILLED_SYRINGE | INTRAVENOUS | Status: DC | PRN
  Administered 2022-09-02: 50 mg via INTRAVENOUS

## 2022-09-02 SURGICAL SUPPLY — 36 items
BAG COUNTER SPONGE SURGICOUNT (BAG) IMPLANT
BAG SPNG CNTER NS LX DISP (BAG) ×1
BLADE SURG 15 STRL LF DISP TIS (BLADE) ×1 IMPLANT
BLADE SURG 15 STRL SS (BLADE) ×1
BUR CROSS CUT FISSURE 1.6 (BURR) ×1 IMPLANT
BUR EGG ELITE 4.0 (BURR) ×1 IMPLANT
CANISTER SUCT 3000ML PPV (MISCELLANEOUS) ×1 IMPLANT
COVER SURGICAL LIGHT HANDLE (MISCELLANEOUS) ×1 IMPLANT
GAUZE PACKING FOLDED 2  STR (GAUZE/BANDAGES/DRESSINGS) ×1
GAUZE PACKING FOLDED 2 STR (GAUZE/BANDAGES/DRESSINGS) ×1 IMPLANT
GLOVE BIO SURGEON STRL SZ 6.5 (GLOVE) IMPLANT
GLOVE BIO SURGEON STRL SZ7 (GLOVE) IMPLANT
GLOVE BIO SURGEON STRL SZ8 (GLOVE) ×1 IMPLANT
GLOVE BIOGEL PI IND STRL 6.5 (GLOVE) IMPLANT
GLOVE BIOGEL PI IND STRL 7.0 (GLOVE) IMPLANT
GOWN STRL REUS W/ TWL LRG LVL3 (GOWN DISPOSABLE) ×1 IMPLANT
GOWN STRL REUS W/ TWL XL LVL3 (GOWN DISPOSABLE) ×1 IMPLANT
GOWN STRL REUS W/TWL LRG LVL3 (GOWN DISPOSABLE) ×1
GOWN STRL REUS W/TWL XL LVL3 (GOWN DISPOSABLE) ×2
IV NS 1000ML (IV SOLUTION) ×1
IV NS 1000ML BAXH (IV SOLUTION) ×1 IMPLANT
KIT BASIN OR (CUSTOM PROCEDURE TRAY) ×1 IMPLANT
KIT TURNOVER KIT B (KITS) ×1 IMPLANT
NDL HYPO 25GX1X1/2 BEV (NEEDLE) ×2 IMPLANT
NEEDLE HYPO 25GX1X1/2 BEV (NEEDLE) ×2 IMPLANT
NS IRRIG 1000ML POUR BTL (IV SOLUTION) ×1 IMPLANT
PAD ARMBOARD 7.5X6 YLW CONV (MISCELLANEOUS) ×1 IMPLANT
SLEEVE IRRIGATION ELITE 7 (MISCELLANEOUS) ×1 IMPLANT
SPIKE FLUID TRANSFER (MISCELLANEOUS) ×1 IMPLANT
SPONGE SURGIFOAM ABS GEL 12-7 (HEMOSTASIS) IMPLANT
SUT CHROMIC 3 0 PS 2 (SUTURE) ×1 IMPLANT
SYR BULB IRRIG 60ML STRL (SYRINGE) ×1 IMPLANT
SYR CONTROL 10ML LL (SYRINGE) ×1 IMPLANT
TRAY ENT MC OR (CUSTOM PROCEDURE TRAY) ×1 IMPLANT
TUBING IRRIGATION (MISCELLANEOUS) ×1 IMPLANT
YANKAUER SUCT BULB TIP NO VENT (SUCTIONS) ×1 IMPLANT

## 2022-09-02 NOTE — Op Note (Signed)
NAME: Veronica Leblanc, Veronica Leblanc MEDICAL RECORD NO: [id number] ACCOUNT NO: [id number] DATE OF BIRTH: 01/14/1961 FACILITY: [facility] LOCATION: [facility]-PER[facility] PHYSICIAN: Gae Bon, DDS  Operative Report   DATE OF PROCEDURE: 09/02/2022  PREOPERATIVE DIAGNOSIS:  Nonrestorable teeth numbers 7, 8, 9, 10, 21, 22, 23, 24, 25, 26, 27, 28, 29, 30 secondary to dental caries and periodontitis.  POSTOPERATIVE DIAGNOSIS:  Nonrestorable teeth numbers 7, 8, 9, 10, 21, 22, 23, 24, 25, 26, 27, 28, 29, 30 secondary to dental caries and periodontitis.  PROCEDURE:  Extraction teeth numbers 7, 8, 9, 10, 21, 22, 23, 24, 25, 26, 27, 28, 29, 30; Alveoloplasty, right and left maxilla and mandible.  SURGEON:  Gae Bon, DDS  ANESTHESIA:  General, oral intubation, Dr. Marcie Bal, attending.  DESCRIPTION OF PROCEDURE:  The patient was taken to the operating room and placed on the table in supine position.  General anesthesia was administered.  Attempts were made at nasal endotracheal tube, but an oral endotracheal tube had been placed and  secured.  The eyes were protected.  The patient was draped for surgery.  Timeout was performed.  The posterior pharynx was suctioned and a throat pack was placed.  2% lidocaine 1:100,000 epinephrine was infiltrated in an inferior alveolar block on the  right and left sides and in buccal and palatal infiltration of the maxilla around the teeth to be removed.  A bite block was placed on the right side of the mouth.  A sweetheart retractor was used to retract the tongue.  A #15 blade was used to make an  incision beginning 1 cm proximal to tooth #21 carried forward buccally and lingually around the teeth as best could be determined.  The periosteum was difficult to reflect because the tissue was very friable and inflamed.  The teeth were elevated with a  301 elevator and teeth numbers 21 22, 23, 24, 25 were removed using the rongeurs.  The periosteum was reflected to expose the alveolar bone,  which was irregular in contour.  Alveoloplasty was performed using the egg bur and bone file and then closed with  3-0 chromic.  Then, the left maxilla was operated with the 15 blade, was used to make an incision beginning around tooth #10 and carried forward across the midline towards the right around teeth numbers 9, 8, 7.  The periosteum was reflected as best  could be done as the tissue was extremely friable.  The teeth were elevated with a 301 elevator and removed from the mouth with the dental forceps.  The sockets were curetted.  The periosteum was reflected to expose the alveolar crest and then  alveoplasty was performed using the egg bur followed by the bone file.  Then, the throat pack was removed.  The endotracheal tube was untapped and repositioned to the left side of the mouth and then re-taped.  New throat pack was placed.  Then, attention  was turned to the right side.  The 15 blade was used to make an incision around teeth numbers 30, 29, 28, 27 and 26.  The periosteum was reflected as best as possible given the friability of the tissue and then the teeth were elevated and removed from  the mouth with the dental forceps and rongeurs.  The sockets were curetted.  The periosteum was reflected further to expose the alveolar crest, which was irregular in contour with multiple sharp edges and thin bone. Alveoloplasty was performed and then  the area was irrigated and closed with 3-0 chromic  in the right maxilla, the teeth have previously extracted an additional suture was placed in the area of #7.  Then, the oral cavity was irrigated and suctioned.  Additional local anesthesia was  administered.  The throat pack was removed.  The patient was left under the care of anesthesia, after extubation and transport to recovery room with plans for discharge home through day surgery.  ESTIMATED BLOOD LOSS:  100 mL from oral surgery.  DRAINS:  None.  SPECIMENS:  None.  COUNTS:  Correct.   PUS D:  09/02/2022 11:35:10 am T: 09/02/2022 11:55:00 am  JOB: [id number]

## 2022-09-02 NOTE — Anesthesia Procedure Notes (Addendum)
Procedure Name: Intubation Date/Time: 09/02/2022 10:35 AM  Performed by: Minerva Ends, CRNAPre-anesthesia Checklist: Patient identified, Emergency Drugs available, Suction available and Patient being monitored Patient Re-evaluated:Patient Re-evaluated prior to induction Oxygen Delivery Method: Circle system utilized Preoxygenation: Pre-oxygenation with 100% oxygen Induction Type: IV induction Ventilation: Mask ventilation without difficulty Laryngoscope Size: Mac and 3 Grade View: Grade II Tube type: Oral Tube size: 7.0 mm Number of attempts: 3 Airway Equipment and Method: Stylet and Oral airway Placement Confirmation: ETT inserted through vocal cords under direct vision, positive ETCO2 and breath sounds checked- equal and bilateral Secured at: 21 cm Tube secured with: Tape Dental Injury: Teeth and Oropharynx as per pre-operative assessment  Comments: Attempts 1: unable to pass nasal ETT through vocal chords after multiple attempts to pass nasal tube through L and R nares.  Eventually able to pass nasal tube through L nare, Airway suctioned of blood. Oral airway inserted for ventilation assistance.  Attempt 2: mac 3 oral 7.0 ett successful placement. Airway suctioned through ETT. FIO2 100%. Albuteral admin. Reverse Trendelenburg position. Sats 99%.

## 2022-09-02 NOTE — Progress Notes (Signed)
Lab called cbc hemolyzed. Anesthesia stated no need to redraw cbc at this time

## 2022-09-02 NOTE — Anesthesia Preprocedure Evaluation (Signed)
Anesthesia Evaluation  Patient identified by MRN, date of birth, ID band Patient awake    Reviewed: Allergy & Precautions, H&P , NPO status , Patient's Chart, lab work & pertinent test results  History of Anesthesia Complications (+) PONV and history of anesthetic complications  Airway Mallampati: II   Neck ROM: full    Dental   Pulmonary asthma , sleep apnea    breath sounds clear to auscultation       Cardiovascular negative cardio ROS  Rhythm:regular Rate:Normal     Neuro/Psych  Headaches CVA    GI/Hepatic   Endo/Other  diabetes, Type 2  Morbid obesity  Renal/GU      Musculoskeletal  (+) Arthritis ,    Abdominal   Peds  Hematology   Anesthesia Other Findings   Reproductive/Obstetrics                             Anesthesia Physical Anesthesia Plan  ASA: 3  Anesthesia Plan: General   Post-op Pain Management:    Induction: Intravenous  PONV Risk Score and Plan: 4 or greater and Ondansetron, Dexamethasone, Midazolam and Treatment may vary due to age or medical condition  Airway Management Planned: Nasal ETT  Additional Equipment:   Intra-op Plan:   Post-operative Plan: Extubation in OR  Informed Consent: I have reviewed the patients History and Physical, chart, labs and discussed the procedure including the risks, benefits and alternatives for the proposed anesthesia with the patient or authorized representative who has indicated his/her understanding and acceptance.     Dental advisory given  Plan Discussed with: CRNA, Anesthesiologist and Surgeon  Anesthesia Plan Comments:        Anesthesia Quick Evaluation

## 2022-09-02 NOTE — Op Note (Signed)
09/02/2022  11:29 AM  PATIENT:  Veronica Leblanc  62 y.o. female  PRE-OPERATIVE DIAGNOSIS:  Non-restorable teeth # seven,eight,nine,ten,twenty one,twenty two,twenty three,twenty four,twenty five,twenty six,twenty seven ,twentyeight twentynine thirty secondary to [place] and periodontitis  POST-OPERATIVE DIAGNOSIS:  SAME  PROCEDURE:  Procedure(s): EXTRACTIONS teeth number seven,eight,nine,ten,twenty one,twenty two,twenty three,twenty four,twenty five,twenty six,twenty seven ,twentyeight twentynine thirty,Alveoloplasty  SURGEON:  Surgeon(s): Diona Browner, [name]  ANESTHESIA:   local and general  EBL:  minimal  DRAINS: none   SPECIMEN:  No Specimen  COUNTS:  YES  PLAN OF CARE: Discharge to home after PACU  PATIENT DISPOSITION:  PACU - hemodynamically stable.   PROCEDURE DETAILS: Dictation # [id number]  Gae Bon, [name] 09/02/2022 11:29 AM

## 2022-09-02 NOTE — Transfer of Care (Signed)
Immediate Anesthesia Transfer of Care Note  Patient: Veronica Leblanc  Procedure(s) Performed: DENTAL RESTORATION/EXTRACTIONS teeth number seven,eight,nine,ten,twenty one,twenty two,twenty three,twenty four,twenty five,twenty six,twenty seven ,twentyeight twentynine thirty,Alveoloplasty  Patient Location: PACU  Anesthesia Type:General  Level of Consciousness: awake  Airway & Oxygen Therapy: Patient Spontanous Breathing and Patient connected to face mask oxygen  Post-op Assessment: Report given to RN and Post -op Vital signs reviewed and stable  Post vital signs: Reviewed and stable  Last Vitals:  Vitals Value Taken Time  BP 98/54 09/02/22 1139  Temp    Pulse 88 09/02/22 1141  Resp 19 09/02/22 1141  SpO2 97 % 09/02/22 1141  Vitals shown include unvalidated device data.  Last Pain:  Vitals:   09/02/22 0842  TempSrc:   PainSc: 0-No pain      Patients Stated Pain Goal: 0 ([id number])  Complications: No notable events documented.

## 2022-09-02 NOTE — H&P (Signed)
H&P documentation  -History and Physical Reviewed  -Patient has been re-examined  -No change in the plan of care  Veronica Leblanc  

## 2022-09-03 ENCOUNTER — Encounter (HOSPITAL_COMMUNITY): Payer: Self-pay | Admitting: Oral Surgery

## 2022-09-03 NOTE — Anesthesia Postprocedure Evaluation (Signed)
Anesthesia Post Note  Patient: Veronica Leblanc  Procedure(s) Performed: DENTAL RESTORATION/EXTRACTIONS teeth number seven,eight,nine,ten,twenty one,twenty two,twenty three,twenty four,twenty five,twenty six,twenty seven ,twentyeight twentynine thirty,Alveoloplasty     Patient location during evaluation: PACU Anesthesia Type: General Level of consciousness: awake and alert Pain management: pain level controlled Vital Signs Assessment: post-procedure vital signs reviewed and stable Respiratory status: spontaneous breathing, nonlabored ventilation, respiratory function stable and patient connected to nasal cannula oxygen Cardiovascular status: blood pressure returned to baseline and stable Postop Assessment: no apparent nausea or vomiting Anesthetic complications: no   No notable events documented.  Last Vitals:  Vitals:   09/02/22 1230 09/02/22 1245  BP: 123/67 128/72  Pulse: 81 78  Resp: 14 14  Temp:  37.1 C  SpO2: 93% 94%    Last Pain:  Vitals:   09/02/22 1139  TempSrc:   PainSc: 0-No pain                 Jakala Herford S

## 2022-09-09 ENCOUNTER — Telehealth: Payer: Self-pay

## 2022-09-09 DIAGNOSIS — M1711 Unilateral primary osteoarthritis, right knee: Secondary | ICD-10-CM | POA: Diagnosis not present

## 2022-09-09 DIAGNOSIS — M170 Bilateral primary osteoarthritis of knee: Secondary | ICD-10-CM | POA: Diagnosis not present

## 2022-09-09 DIAGNOSIS — M1712 Unilateral primary osteoarthritis, left knee: Secondary | ICD-10-CM | POA: Diagnosis not present

## 2022-09-09 NOTE — Telephone Encounter (Signed)
   [facility] Pre-operative Risk Assessment    Request for surgical clearance:  What type of surgery is being performed? Total Knee Replacement (location not specified)  When is this surgery scheduled? TBD   What type of clearance is required (medical clearance vs. Pharmacy clearance to hold med vs. Both)? Both  Are there any medications that need to be held prior to surgery and how long?Not specified   Practice name and name of physician performing surgery? Dr. Lara Mulch   What is your office phone number: [phone]    7.   What is your office fax number: [phone]  8.   Anesthesia type (None, local, MAC, general) ? Spinal Anesthesia   Basil Dess Jade Burkard 09/09/2022, 4:51 PM  _________________________________________________________________   (provider comments below)

## 2022-09-10 NOTE — Telephone Encounter (Signed)
   Patient Name: Veronica Leblanc  DOB: 1960/09/24 MRN: [id number]  Primary Cardiologist: None  Chart reviewed as part of pre-operative protocol coverage. Per Dr. Agustin Cree, who saw the pt in clinic on 08/05/2022 and cleared the pt at that time for a dental procedure  with general anesthesia , [name] is at acceptable risk for the planned procedure without further cardiovascular testing.   Patient is on aspirin for history of stroke, managed per neurology.  Therefore, recommendations for holding aspirin prior to procedure should come from managing provider (neurology).  I will route this recommendation to the requesting party via Epic fax function and remove from pre-op pool.  Please call with questions.  Lenna Sciara, NP 09/10/2022, 12:52 PM

## 2022-09-11 DIAGNOSIS — G4733 Obstructive sleep apnea (adult) (pediatric): Secondary | ICD-10-CM | POA: Diagnosis not present

## 2022-09-13 DIAGNOSIS — E1169 Type 2 diabetes mellitus with other specified complication: Secondary | ICD-10-CM | POA: Diagnosis not present

## 2022-09-13 DIAGNOSIS — J449 Chronic obstructive pulmonary disease, unspecified: Secondary | ICD-10-CM | POA: Diagnosis not present

## 2022-09-13 DIAGNOSIS — G43909 Migraine, unspecified, not intractable, without status migrainosus: Secondary | ICD-10-CM | POA: Diagnosis not present

## 2022-09-13 DIAGNOSIS — E785 Hyperlipidemia, unspecified: Secondary | ICD-10-CM | POA: Diagnosis not present

## 2022-10-01 DIAGNOSIS — W19XXXA Unspecified fall, initial encounter: Secondary | ICD-10-CM | POA: Diagnosis not present

## 2022-10-01 DIAGNOSIS — R6889 Other general symptoms and signs: Secondary | ICD-10-CM | POA: Diagnosis not present

## 2022-10-01 DIAGNOSIS — Z043 Encounter for examination and observation following other accident: Secondary | ICD-10-CM | POA: Diagnosis not present

## 2022-10-01 DIAGNOSIS — Z743 Need for continuous supervision: Secondary | ICD-10-CM | POA: Diagnosis not present

## 2022-10-01 DIAGNOSIS — M25562 Pain in left knee: Secondary | ICD-10-CM | POA: Diagnosis not present

## 2022-10-05 ENCOUNTER — Ambulatory Visit: Payer: 59 | Admitting: Neurology

## 2022-10-05 ENCOUNTER — Telehealth: Payer: Self-pay

## 2022-10-05 NOTE — Telephone Encounter (Signed)
        Patient  visited [facility] on 10/01/2022  for treatment.   Telephone encounter attempt :  1st  A HIPAA compliant voice message was left requesting a return call.  Instructed patient to call back at [phone].   Aryahna Spagna [facility]  [facility] Population Health Community Resource Care Guide   [email or web address]  ?? [phone]   Website: [email or web address]  [email or web address]

## 2022-10-07 ENCOUNTER — Telehealth: Payer: Self-pay

## 2022-10-07 DIAGNOSIS — M170 Bilateral primary osteoarthritis of knee: Secondary | ICD-10-CM | POA: Diagnosis not present

## 2022-10-07 NOTE — Telephone Encounter (Signed)
        Patient  visited [facility] on 10/01/2022  for treatment.   Telephone encounter attempt :  2nd  A HIPAA compliant voice message was left requesting a return call.  Instructed patient to call back at [phone].   Na Waldrip [facility]  [facility] Population Health Community Resource Care Guide   [email or web address]  ?? [phone]   Website: [email or web address]

## 2022-10-12 DIAGNOSIS — Z136 Encounter for screening for cardiovascular disorders: Secondary | ICD-10-CM | POA: Diagnosis not present

## 2022-10-12 DIAGNOSIS — Z0000 Encounter for general adult medical examination without abnormal findings: Secondary | ICD-10-CM | POA: Diagnosis not present

## 2022-10-12 DIAGNOSIS — Z01818 Encounter for other preprocedural examination: Secondary | ICD-10-CM | POA: Diagnosis not present

## 2022-10-12 DIAGNOSIS — G43909 Migraine, unspecified, not intractable, without status migrainosus: Secondary | ICD-10-CM | POA: Diagnosis not present

## 2022-10-12 DIAGNOSIS — Z1389 Encounter for screening for other disorder: Secondary | ICD-10-CM | POA: Diagnosis not present

## 2022-10-12 DIAGNOSIS — E1159 Type 2 diabetes mellitus with other circulatory complications: Secondary | ICD-10-CM | POA: Diagnosis not present

## 2022-10-12 DIAGNOSIS — Z0189 Encounter for other specified special examinations: Secondary | ICD-10-CM | POA: Diagnosis not present

## 2022-10-12 DIAGNOSIS — Z139 Encounter for screening, unspecified: Secondary | ICD-10-CM | POA: Diagnosis not present

## 2022-10-13 DIAGNOSIS — E785 Hyperlipidemia, unspecified: Secondary | ICD-10-CM | POA: Diagnosis not present

## 2022-10-13 DIAGNOSIS — G43909 Migraine, unspecified, not intractable, without status migrainosus: Secondary | ICD-10-CM | POA: Diagnosis not present

## 2022-10-13 DIAGNOSIS — J449 Chronic obstructive pulmonary disease, unspecified: Secondary | ICD-10-CM | POA: Diagnosis not present

## 2022-10-13 DIAGNOSIS — E1169 Type 2 diabetes mellitus with other specified complication: Secondary | ICD-10-CM | POA: Diagnosis not present

## 2022-10-30 DIAGNOSIS — B372 Candidiasis of skin and nail: Secondary | ICD-10-CM | POA: Diagnosis not present

## 2022-11-09 DIAGNOSIS — S8002XA Contusion of left knee, initial encounter: Secondary | ICD-10-CM | POA: Diagnosis not present

## 2022-11-09 DIAGNOSIS — M549 Dorsalgia, unspecified: Secondary | ICD-10-CM | POA: Diagnosis not present

## 2022-11-13 DIAGNOSIS — J449 Chronic obstructive pulmonary disease, unspecified: Secondary | ICD-10-CM | POA: Diagnosis not present

## 2022-11-13 DIAGNOSIS — E785 Hyperlipidemia, unspecified: Secondary | ICD-10-CM | POA: Diagnosis not present

## 2022-11-13 DIAGNOSIS — G43909 Migraine, unspecified, not intractable, without status migrainosus: Secondary | ICD-10-CM | POA: Diagnosis not present

## 2022-11-13 DIAGNOSIS — E1169 Type 2 diabetes mellitus with other specified complication: Secondary | ICD-10-CM | POA: Diagnosis not present

## 2022-11-14 DIAGNOSIS — R519 Headache, unspecified: Secondary | ICD-10-CM | POA: Diagnosis not present

## 2022-11-14 DIAGNOSIS — S0990XA Unspecified injury of head, initial encounter: Secondary | ICD-10-CM | POA: Diagnosis not present

## 2022-11-14 DIAGNOSIS — S86999A Other injury of unspecified muscle(s) and tendon(s) at lower leg level, unspecified leg, initial encounter: Secondary | ICD-10-CM | POA: Diagnosis not present

## 2022-11-14 DIAGNOSIS — R079 Chest pain, unspecified: Secondary | ICD-10-CM | POA: Diagnosis not present

## 2022-11-14 DIAGNOSIS — M542 Cervicalgia: Secondary | ICD-10-CM | POA: Diagnosis not present

## 2022-11-14 DIAGNOSIS — R0789 Other chest pain: Secondary | ICD-10-CM | POA: Diagnosis not present

## 2022-11-14 DIAGNOSIS — R0781 Pleurodynia: Secondary | ICD-10-CM | POA: Diagnosis not present

## 2022-11-14 DIAGNOSIS — W19XXXA Unspecified fall, initial encounter: Secondary | ICD-10-CM | POA: Diagnosis not present

## 2022-11-14 DIAGNOSIS — Z743 Need for continuous supervision: Secondary | ICD-10-CM | POA: Diagnosis not present

## 2022-11-18 ENCOUNTER — Telehealth: Payer: Self-pay | Admitting: *Deleted

## 2022-11-18 NOTE — Telephone Encounter (Signed)
Transition Care Management Unsuccessful Follow-up Telephone Call  Date of discharge and from where:  [facility] 11/14/2022 Attempts:  2nd Attempt  Reason for unsuccessful TCM follow-up call:  Left voice message

## 2022-11-18 NOTE — Telephone Encounter (Signed)
Transition Care Management Unsuccessful Follow-up Telephone Call  Date of discharge and from where:  [facility] 11/14/2022  Attempts:  1st Attempt  Reason for unsuccessful TCM follow-up call:  Left voice message

## 2022-12-03 ENCOUNTER — Encounter: Payer: Self-pay | Admitting: Diagnostic Neuroimaging

## 2022-12-03 ENCOUNTER — Ambulatory Visit (INDEPENDENT_AMBULATORY_CARE_PROVIDER_SITE_OTHER): Payer: 59 | Admitting: Diagnostic Neuroimaging

## 2022-12-03 VITALS — BP 120/70 | HR 69 | Ht 64.0 in | Wt 241.0 lb

## 2022-12-03 DIAGNOSIS — Z8673 Personal history of transient ischemic attack (TIA), and cerebral infarction without residual deficits: Secondary | ICD-10-CM | POA: Diagnosis not present

## 2022-12-03 DIAGNOSIS — E785 Hyperlipidemia, unspecified: Secondary | ICD-10-CM

## 2022-12-03 DIAGNOSIS — I10 Essential (primary) hypertension: Secondary | ICD-10-CM

## 2022-12-03 DIAGNOSIS — G43109 Migraine with aura, not intractable, without status migrainosus: Secondary | ICD-10-CM | POA: Diagnosis not present

## 2022-12-03 DIAGNOSIS — E119 Type 2 diabetes mellitus without complications: Secondary | ICD-10-CM

## 2022-12-03 DIAGNOSIS — G4733 Obstructive sleep apnea (adult) (pediatric): Secondary | ICD-10-CM

## 2022-12-03 NOTE — Progress Notes (Signed)
[facility]  PATIENT: Veronica Leblanc DOB: Nov 17, 1960  REFERRING CLINICIAN: Lezlie Lye, Irma M, * HISTORY FROM: patient  REASON FOR VISIT: new consult   HISTORICAL  CHIEF COMPLAINT:  Chief Complaint  Patient presents with   New Patient (Initial Visit)    Pt alone, rm 7. Has a hx of migraines and hx of stroke and TIA's. She is needing knee surgery coming up and clearance for the procedure. Her last concern for TIA was in Jan 2024 went to ER as code stroke but was diagnosed with complex migraine and ruled out stroke. On gabapentin and topiramate for headache/migraine prevention. States no longer effective. She is having 2-3 migraines a week. If catch in time she can take ibuprofen.    Other    She doesn't remember all meds previously tried, been several years since she has been treated/evaluated for them.  Last MRI brain was in Jan 2024 at [facility]    HISTORY OF PRESENT ILLNESS:   62 year old female here for evaluation of stroke, TIA, complicated migraine.  Age 4 years old patient had onset of headache and facial numbness.  She went to the hospital and was diagnosed with possible stroke.  Had CT scan of the head and some other testing.  She was on oral contraceptives at that time.  Since that time patient has had intermittent numbness and tingling sensation.  Sometimes she went to the ER for this and was diagnosed with TIA versus complicated migraine.  January 2024 patient went to the hospital for constellation of symptoms including chest pain, headache and numbness.  She had MRI of the brain which was unremarkable.  She was diagnosed with complicated migraine.  Patient has some knee pain issues planning to have left knee surgery.  Requesting neurologic evaluation and clearance in anticipation for the surgery.  Has hypertension, diabetes, hyperlipidemia, obstructive sleep apnea, currently being medically managed.   REVIEW OF SYSTEMS: Full 14 system review  of systems performed and negative with exception of: as per HPI.  ALLERGIES: Allergies  Allergen Reactions   Reglan [Metoclopramide] Hives   Stadol [Butorphanol] Hives    Nasal spray    Amitriptyline Hcl Hives and Swelling   Cefazolin Hives and Swelling   Ceftriaxone Sodium Hives and Swelling   Ciprofloxacin Hives and Swelling   Darvocet [Propoxyphene N-Acetaminophen] Hives and Swelling   Penicillins Hives and Swelling   Prochlorperazine Edisylate Hives and Swelling   Promethazine Hcl     Jaw locks up   Propoxyphene Hives and Swelling   Sulfamethoxazole Swelling   Sulfonamide Derivatives Hives and Swelling   Sumatriptan Hives and Swelling    HOME MEDICATIONS: Outpatient Medications Prior to Visit  Medication Sig Dispense Refill   albuterol (VENTOLIN HFA) 108 (90 Base) MCG/ACT inhaler Inhale 2 puffs into the lungs every 4 (four) hours as needed. (Patient taking differently: Inhale 2 puffs into the lungs every 4 (four) hours as needed for wheezing or shortness of breath.) 34 g 1   aspirin EC 81 MG tablet Take 81 mg by mouth daily. Swallow whole.     budesonide-formoterol (SYMBICORT) 160-4.5 MCG/ACT inhaler Inhale 2 puffs into the lungs 2 (two) times daily.     calcium carbonate (OS-CAL) 600 MG tablet Take 600 mg by mouth 2 (two) times daily with a meal.     cetirizine (ZYRTEC) 10 MG tablet Take 10 mg by mouth daily.     citalopram (CELEXA) 40 MG tablet Take 40 mg by mouth at bedtime. Takes 1  tablet at bedtime.     cyclobenzaprine (FLEXERIL) 5 MG tablet Take 5-10 mg by mouth 3 (three) times daily.     empagliflozin (JARDIANCE) 25 MG TABS tablet Take 25 mg by mouth daily.     EPINEPHrine 0.3 mg/0.3 mL IJ SOAJ injection Use as directed for life-threatening allergic reaction. (Patient taking differently: Inject 0.3 mg into the muscle as needed for anaphylaxis. Use as directed for life-threatening allergic reaction.) 2 each 3   furosemide (LASIX) 20 MG tablet Take 20 mg by mouth daily as  needed for fluid or edema.     gabapentin (NEURONTIN) 300 MG capsule Take 300 mg by mouth 2 (two) times daily.     hydrOXYzine (VISTARIL) 100 MG capsule Take 100 mg by mouth 3 (three) times daily as needed for anxiety.     ibuprofen (ADVIL) 200 MG tablet Take 800 mg by mouth every 8 (eight) hours as needed for mild pain or moderate pain.     Melatonin 10 MG TABS Take 60 mg by mouth at bedtime.     metoprolol succinate (TOPROL-XL) 50 MG 24 hr tablet Take 1 tablet (50 mg total) by mouth daily. Take with or immediately following a meal. 90 tablet 3   mirabegron ER (MYRBETRIQ) 50 MG TB24 tablet Take 50 mg by mouth daily.     montelukast (SINGULAIR) 10 MG tablet Take 10 mg by mouth at bedtime.     omeprazole (PRILOSEC) 40 MG capsule Take 40 mg by mouth daily.     ondansetron (ZOFRAN) 4 MG tablet Take 8 mg by mouth 2 (two) times daily.     traMADol (ULTRAM) 50 MG tablet Take 50 mg by mouth daily as needed for moderate pain.     rosuvastatin (CRESTOR) 40 MG tablet Take 40 mg by mouth at bedtime.     temazepam (RESTORIL) 30 MG capsule Take 1 capsule by mouth at bedtime as needed for sleep. 30 capsule 5   topiramate (TOPAMAX) 25 MG tablet Take 50 mg by mouth at bedtime.     clindamycin (CLEOCIN) 300 MG capsule Take 1 capsule (300 mg total) by mouth 3 (three) times daily. 21 capsule 0   MOUNJARO 7.5 MG/0.5ML Pen Inject 7.5 mg into the skin once a week.     oxyCODONE-acetaminophen (PERCOCET) 5-325 MG tablet Take 1 tablet by mouth every 4 (four) hours as needed. 20 tablet 0   No facility-administered medications prior to visit.    PAST MEDICAL HISTORY: Past Medical History:  Diagnosis Date   Acute, but ill-defined, cerebrovascular disease    Arthritis    Asthma    Asthma, mild intermittent 03/21/2019   Chronic pain of left knee 09/07/2018   Complication of anesthesia    CVA (cerebral vascular accident) [facility])    CVA (cerebral vascular accident) (HCC)    Age 62   Dyspnea on exertion 09/18/2019    Eczema    Endometriosis, site unspecified    History of migraine headaches    Insomnia 08/30/2019   Late effect of cerebrovascular accident (CVA) 09/18/2019   Nocturnal hypoxemia 05/10/2020   Obstructive sleep apnea 09/09/2010   PSG from 05/06/10>>AHI 9.6, SpO2 low 77% from [facility].   CPAP titration 07/16/10>>CPAP 8 cm from [facility].    OSA (obstructive sleep apnea)    Palpitations 09/18/2019   PONV (postoperative nausea and vomiting)    S/P left knee arthroscopy 05/08/2019   Type 2 diabetes mellitus without complication, without long-term current use of insulin (HCC) 09/18/2019   Urticaria  PAST SURGICAL HISTORY: Past Surgical History:  Procedure Laterality Date   APPENDECTOMY     CESAREAN SECTION     CHOLECYSTECTOMY     ganglionectomy     c2/c3 right and left side   NECK SURGERY     5 times   presacral neurectomy     SALPINGOOPHORECTOMY     left   TOOTH EXTRACTION N/A 09/02/2022   Procedure: DENTAL RESTORATION/EXTRACTIONS teeth number seven,eight,nine,ten,twenty one,twenty two,twenty three,twenty four,twenty five,twenty six,twenty seven ,twentyeight twentynine thirty,Alveoloplasty;  Surgeon: [name], [name];  Location: [facility] OR;  Service: Oral Surgery;  Laterality: N/A;   TOTAL ABDOMINAL HYSTERECTOMY      FAMILY HISTORY: Family History  Problem Relation Age of Onset   Diabetes Father    Hypertension Father    Diabetes Brother        multiple   Hypertension Mother    Hypertension Brother    Breast cancer Maternal Aunt    Heart disease Maternal Grandfather    Heart disease Paternal Grandfather    Colon cancer Cousin    Pancreatic cancer Maternal Grandmother     SOCIAL HISTORY: Social History   Socioeconomic History   Marital status: Divorced    Spouse name: Not on file   Number of children: Not on file   Years of education: Not on file   Highest education level: Not on file  Occupational History   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  Tobacco  Use   Smoking status: Never   Smokeless tobacco: Never  Vaping Use   Vaping Use: Never used  Substance and Sexual Activity   Alcohol use: Yes    Comment: occas glass of wine   Drug use: Never   Sexual activity: Not on file  Other Topics Concern   Not on file  Social History Narrative   Not on file   Social Determinants of Health   Financial Resource Strain: Not on file  Food Insecurity: Not on file  Transportation Needs: Not on file  Physical Activity: Not on file  Stress: Not on file  Social Connections: Not on file  Intimate Partner Violence: Not on file     PHYSICAL EXAM  GENERAL EXAM/CONSTITUTIONAL: Vitals:  Vitals:   12/03/22 0828  BP: 120/70  Pulse: 69  Weight: 241 lb (109.3 kg)  Height: 5\' 4"  (1.626 m)   Body mass index is 41.37 kg/m. Wt Readings from Last 3 Encounters:  12/03/22 241 lb (109.3 kg)  09/02/22 249 lb (112.9 kg)  08/05/22 239 lb 12.8 oz (108.8 kg)   Patient is in no distress; well developed, nourished and groomed; neck is supple  CARDIOVASCULAR: Examination of carotid arteries is normal; no carotid bruits Regular rate and rhythm, no murmurs Examination of peripheral vascular system by observation and palpation is normal  EYES: Ophthalmoscopic exam of optic discs and posterior segments is normal; no papilledema or hemorrhages No results found.  MUSCULOSKELETAL: Gait, strength, tone, movements noted in Neurologic exam below  NEUROLOGIC: MENTAL STATUS:      No data to display         awake, alert, oriented to person, place and time recent and remote memory intact normal attention and concentration language fluent, comprehension intact, naming intact fund of knowledge appropriate  CRANIAL NERVE:  2nd - no papilledema on fundoscopic exam 2nd, 3rd, 4th, 6th - pupils equal and reactive to light, visual fields full to confrontation, extraocular muscles intact, no nystagmus 5th - facial sensation symmetric 7th -  facial strength  symmetric 8th - hearing intact 9th - palate elevates symmetrically, uvula midline 11th - shoulder shrug symmetric 12th - tongue protrusion midline  MOTOR:  normal bulk and tone, full strength in the BUE, BLE; except LIMITED IN LEFT LEG DUE TO LEFT KNEE PAIN  SENSORY:  normal and symmetric to light touch, temperature, vibration  COORDINATION:  finger-nose-finger, fine finger movements normal  REFLEXES:  deep tendon reflexes TRACE and symmetric  GAIT/STATION:  narrow based gait     DIAGNOSTIC DATA (LABS, IMAGING, TESTING) - I reviewed patient records, labs, notes, testing and imaging myself where available.  Lab Results  Component Value Date   WBC 15.1 (H) 07/08/2022   HGB 15.6 (H) 07/08/2022   HCT 46.0 07/08/2022   MCV 87.4 07/08/2022   PLT 317 07/08/2022      Component Value Date/Time   NA 136 09/02/2022 0937   K 3.5 09/02/2022 0937   CL 101 09/02/2022 0937   CO2 25 09/02/2022 0937   GLUCOSE 140 (H) 09/02/2022 0937   BUN 9 09/02/2022 0937   CREATININE 0.80 09/02/2022 0937   CALCIUM 9.6 09/02/2022 0937   PROT 8.4 (H) 07/08/2022 2218   ALBUMIN 4.1 07/08/2022 2218   AST 53 (H) 07/08/2022 2218   ALT 46 (H) 07/08/2022 2218   ALKPHOS 136 (H) 07/08/2022 2218   BILITOT 1.1 07/08/2022 2218   GFRNONAA >60 09/02/2022 0937   No results found for: "CHOL", "HDL", "LDLCALC", "LDLDIRECT", "TRIG", "CHOLHDL" No results found for: "HGBA1C" No results found for: "VITAMINB12" No results found for: "TSH"   07/09/22 MRI brain [I reviewed images myself and agree with interpretation. -VRP]  1. No acute intracranial abnormality. 2. Moderately advanced chronic microvascular ischemic disease.  07/31/21 TTE 1. Only good view for LV function was short axis. Left ventricular  ejection fraction, by estimation, is 50 to 55%. The left ventricle has low  normal function. Left ventricular endocardial border not optimally defined  to evaluate regional wall motion.  There is moderate  concentric left ventricular hypertrophy. Left  ventricular diastolic parameters are consistent with Grade I diastolic  dysfunction (impaired relaxation).   2. Right ventricular systolic function is normal. The right ventricular  size is normal.   3. The mitral valve is normal in structure. No evidence of mitral valve  regurgitation. No evidence of mitral stenosis.   4. The aortic valve is normal in structure. Aortic valve regurgitation is  not visualized. Aortic valve sclerosis is present, with no evidence of  aortic valve stenosis.   10/29/19 carotid u/s Right Carotid: Velocities in the right ICA are consistent with a 1-39%  stenosis.   Left Carotid: Velocities in the left ICA are consistent with a 1-39%  stenosis.   Vertebrals: Bilateral vertebral arteries demonstrate antegrade flow.  Subclavians: Normal flow hemodynamics were seen in bilateral subclavian arteries.     ASSESSMENT AND PLAN  62 y.o. year old female here with"   Dx:  1. Complicated migraine   2. Migraine with aura and without status migrainosus, not intractable   3. History of stroke   4. Type 2 diabetes mellitus without complication, without long-term current use of insulin (HCC)   5. OSA (obstructive sleep apnea)   6. Primary hypertension   7. Hyperlipidemia, unspecified hyperlipidemia type      PLAN:  MIGRAINE WITH AURA (3-4x / week) - continue topiramate + gabapentin  - consider ajovy / aimovig / emgality for preventions - consider nurtec as needed  H/o stroke (age 52  years old; had migraine + OCP at that time; no other risk factors) + chronic small vessel ischemic disease  - continue aspirin, statin, BP control, DM control - last event 07/07/22 likely complicated migraine  KNEE SURGERY NEUROLOGIC EVALUATION - no neurologic contraindication for anticipated knee surgery  - There may be a small risk of stroke by stopping antiplatelet or anticoagulation therapy in the peri-procedural period. If  patient agrees to these risks, then patient may come off (aspirin for 3 days) in preparation for procedure, and restarted as soon as possible post-procedure.  Return for return to PCP, pending if symptoms worsen or fail to improve.    Suanne Marker, MD 12/03/2022, 9:25 AM Certified in Neurology, Neurophysiology and Neuroimaging  [facility] [address] [place], [place] [address] [phone]

## 2022-12-03 NOTE — Patient Instructions (Signed)
  MIGRAINE WITH AURA (3-4x / week) - continue topiramate + gabapentin  - consider ajovy / aimovig / emgality for preventions - consider nurtec as needed  H/o stroke (age 62 years old; had migraine + OCP at that time; no other risk factors) + chronic small vessel ischemic disease  - continue aspirin, statin, BP control, DM control - last event 07/07/22 likely complicated migraine  KNEE SURGERY NEUROLOGIC EVALUATION - no neurologic contraindication for anticipated knee surgery  - There may be a small risk of stroke by stopping antiplatelet or anticoagulation therapy in the peri-procedural period. If patient agrees to these risks, then patient may come off (aspirin for 3 days) in preparation for procedure, and restarted as soon as possible post-procedure.

## 2022-12-07 DIAGNOSIS — M25562 Pain in left knee: Secondary | ICD-10-CM | POA: Diagnosis not present

## 2022-12-08 ENCOUNTER — Telehealth: Payer: Self-pay | Admitting: Diagnostic Neuroimaging

## 2022-12-08 NOTE — Telephone Encounter (Signed)
Did you received clearance form for this patient? We do not have it yet.

## 2022-12-08 NOTE — Telephone Encounter (Signed)
Pt said Dr. Jeannett Senior Lucey's office faxed over a surgical for surgery. Cannot have until they receive the surgical clearance. Would like call from the nurse.

## 2022-12-09 NOTE — Telephone Encounter (Signed)
Can someone contact pt and inform him Stanton Kidney in [place] has not received anything. Please have them refax clearance to her.

## 2022-12-09 NOTE — Telephone Encounter (Signed)
Called pt LVM (per DPR) letting her know that we haven't received medical clearance form. I asked pt to please have form refax to [phone].

## 2022-12-13 DIAGNOSIS — E1169 Type 2 diabetes mellitus with other specified complication: Secondary | ICD-10-CM | POA: Diagnosis not present

## 2022-12-13 DIAGNOSIS — G43909 Migraine, unspecified, not intractable, without status migrainosus: Secondary | ICD-10-CM | POA: Diagnosis not present

## 2022-12-13 DIAGNOSIS — E785 Hyperlipidemia, unspecified: Secondary | ICD-10-CM | POA: Diagnosis not present

## 2022-12-13 DIAGNOSIS — J449 Chronic obstructive pulmonary disease, unspecified: Secondary | ICD-10-CM | POA: Diagnosis not present

## 2022-12-13 NOTE — Telephone Encounter (Signed)
Pt checking on of surgical clearance form have been received and re-faxed for knee surgery. Would like call back.

## 2022-12-21 DIAGNOSIS — R300 Dysuria: Secondary | ICD-10-CM | POA: Diagnosis not present

## 2022-12-21 DIAGNOSIS — M1712 Unilateral primary osteoarthritis, left knee: Secondary | ICD-10-CM | POA: Diagnosis not present

## 2022-12-21 DIAGNOSIS — M25562 Pain in left knee: Secondary | ICD-10-CM | POA: Diagnosis not present

## 2022-12-31 NOTE — Patient Instructions (Addendum)
SURGICAL WAITING ROOM VISITATION Patients having surgery or a procedure may have no more than 2 support people in the waiting area - these visitors may rotate.    Children under the age of 76 must have an adult with them who is not the patient.  If the patient needs to stay at the hospital during part of their recovery, the visitor guidelines for inpatient rooms apply. Pre-op nurse will coordinate an appropriate time for 1 support person to accompany patient in pre-op.  This support person may not rotate.    Please refer to the [facility] website for the visitor guidelines for Inpatients (after your surgery is over and you are in a regular room).       Your procedure is scheduled on: 01-17-23   Report to [facility] Main Entrance    Report to admitting at 7:50 AM   Call this number if you have problems the morning of surgery [phone]   Do not eat food :After Midnight.   After Midnight you may have the following liquids until 7:20 AM DAY OF SURGERY  Water Non-Citrus Juices (without pulp, NO RED-Apple, White grape, White cranberry) Black Coffee (NO MILK/CREAM OR CREAMERS, sugar ok)  Clear Tea (NO MILK/CREAM OR CREAMERS, sugar ok) regular and decaf                             Plain Jell-O (NO RED)                                           Fruit ices (not with fruit pulp, NO RED)                                     Popsicles (NO RED)                                                               Sports drinks like Gatorade (NO RED)             FOLLOW ANY ADDITIONAL PRE OP INSTRUCTIONS YOU RECEIVED FROM YOUR SURGEON'S OFFICE!!!   Oral Hygiene is also important to reduce your risk of infection.                                    Remember - BRUSH YOUR TEETH THE MORNING OF SURGERY WITH YOUR REGULAR TOOTHPASTE   Do NOT smoke after Midnight   Take these medicines the morning of surgery with A SIP OF WATER:    Zyrtec  Citalopram  Gabapentin  Hydroxyzine  Metoprolol  Mirabegron  Omeprazole  Tramadol if needed  Okay to use inhalers  How to Manage Your Diabetes Before and After Surgery  Why is it important to control my blood sugar before and after surgery? Improving blood sugar levels before and after surgery helps healing and can limit problems. A way of improving blood sugar control is eating a healthy diet by:  Eating less sugar and carbohydrates  Increasing activity/exercise  Talking with your doctor about reaching your blood sugar goals High blood sugars (greater than 180 mg/dL) can raise your risk of infections and slow your recovery, so you will need to focus on controlling your diabetes during the weeks before surgery. Make sure that the doctor who takes care of your diabetes knows about your planned surgery including the date and location.  How do I manage my blood sugar before surgery? Check your blood sugar at least 4 times a day, starting 2 days before surgery, to make sure that the level is not too high or low. Check your blood sugar the morning of your surgery when you wake up and every 2 hours until you get to the Short Stay unit. If your blood sugar is less than 70 mg/dL, you will need to treat for low blood sugar: Do not take insulin. Treat a low blood sugar (less than 70 mg/dL) with  cup of clear juice (cranberry or apple), 4 glucose tablets, OR glucose gel. Recheck blood sugar in 15 minutes after treatment (to make sure it is greater than 70 mg/dL). If your blood sugar is not greater than 70 mg/dL on recheck, call [phone] for further instructions. Report your blood sugar to the short stay nurse when you get to Short Stay.  If you are admitted to the hospital after surgery: Your blood sugar will be checked by the staff and you will probably be given insulin after surgery (instead of oral diabetes medicines) to make sure you have good blood sugar levels. The goal  for blood sugar control after surgery is 80-180 mg/dL.   WHAT DO I DO ABOUT MY DIABETES MEDICATION?  - HOLD jardiance after 01/13/23  DO NOT TAKE THE FOLLOWING 7 DAYS PRIOR TO SURGERY: Ozempic, Wegovy, Rybelsus (Semaglutide), Byetta (exenatide), Bydureon (exenatide ER), Victoza, Saxenda (liraglutide), or Trulicity (dulaglutide) Mounjaro (Tirzepatide) Adlyxin (Lixisenatide), Polyethylene Glycol Loxenatide.  Bring CPAP mask and tubing day of surgery.                              You may not have any metal on your body including hair pins, jewelry, and body piercing             Do not wear make-up, lotions, powders, perfumes or deodorant  Do not wear nail polish including gel and S&S, artificial/acrylic nails, or any other type of covering on natural nails including finger and toenails. If you have artificial nails, gel coating, etc. that needs to be removed by a nail salon please have this removed prior to surgery or surgery may need to be canceled/ delayed if the surgeon/ anesthesia feels like they are unable to be safely monitored.   Do not shave  48 hours prior to surgery.    Do not bring valuables to the hospital. [facility] IS NOT RESPONSIBLE   FOR VALUABLES.   Contacts, dentures or bridgework may not be worn into surgery.   Bring small overnight bag day of surgery.   DO NOT BRING YOUR HOME MEDICATIONS TO THE HOSPITAL. PHARMACY WILL DISPENSE MEDICATIONS LISTED ON YOUR MEDICATION LIST TO YOU DURING YOUR ADMISSION IN THE HOSPITAL!     Special Instructions: Bring a copy of your healthcare power of attorney and living will documents the day of surgery if you haven't scanned them before.              Please read over the following fact sheets you were given: IF  YOU HAVE QUESTIONS ABOUT YOUR PRE-OP INSTRUCTIONS PLEASE CALL [phone] G[facility]  If you received a COVID test during your pre-op visit  it is requested that you wear a mask when out in public, stay away from anyone that may not be  feeling well and notify your surgeon if you develop symptoms. If you test positive for Covid or have been in contact with anyone that has tested positive in the last 10 days please notify you surgeon.    Pre-operative 5 CHG Bath Instructions   You can play a key role in reducing the risk of infection after surgery. Your skin needs to be as free of germs as possible. You can reduce the number of germs on your skin by washing with CHG (chlorhexidine gluconate) soap before surgery. CHG is an antiseptic soap that kills germs and continues to kill germs even after washing.   DO NOT use if you have an allergy to chlorhexidine/CHG or antibacterial soaps. If your skin becomes reddened or irritated, stop using the CHG and notify one of our RNs at  [phone] .   Please shower with the CHG soap starting 4 days before surgery using the following schedule:     Please keep in mind the following:  DO NOT shave, including legs and underarms, starting the day of your first shower.   You may shave your face at any point before/day of surgery.  Place clean sheets on your bed the day you start using CHG soap. Use a clean washcloth (not used since being washed) for each shower. DO NOT sleep with pets once you start using the CHG.   CHG Shower Instructions:  If you choose to wash your hair and private area, wash first with your normal shampoo/soap.  After you use shampoo/soap, rinse your hair and body thoroughly to remove shampoo/soap residue.  Turn the water OFF and apply about 3 tablespoons (45 ml) of CHG soap to a CLEAN washcloth.  Apply CHG soap ONLY FROM YOUR NECK DOWN TO YOUR TOES (washing for 3-5 minutes)  DO NOT use CHG soap on face, private areas, open wounds, or sores.  Pay special attention to the area where your surgery is being performed.  If you are having back surgery, having someone wash your back for you may be helpful. Wait 2 minutes after CHG soap is applied, then you may rinse off the  CHG soap.  Pat dry with a clean towel  Put on clean clothes/pajamas   If you choose to wear lotion, please use ONLY the CHG-compatible lotions on the back of this paper.     Additional instructions for the day of surgery: DO NOT APPLY any lotions, deodorants, cologne, or perfumes.   Put on clean/comfortable clothes.  Brush your teeth.  Ask your nurse before applying any prescription medications to the skin.      CHG Compatible Lotions   Aveeno Moisturizing lotion  Cetaphil Moisturizing Cream  Cetaphil Moisturizing Lotion  Clairol Herbal Essence Moisturizing Lotion, Dry Skin  Clairol Herbal Essence Moisturizing Lotion, Extra Dry Skin  Clairol Herbal Essence Moisturizing Lotion, Normal Skin  Curel Age Defying Therapeutic Moisturizing Lotion with Alpha Hydroxy  Curel Extreme Care Body Lotion  Curel Soothing Hands Moisturizing Hand Lotion  Curel Therapeutic Moisturizing Cream, Fragrance-Free  Curel Therapeutic Moisturizing Lotion, Fragrance-Free  Curel Therapeutic Moisturizing Lotion, Original Formula  Eucerin Daily Replenishing Lotion  Eucerin Dry Skin Therapy Plus Alpha Hydroxy Crme  Eucerin Dry Skin Therapy Plus Alpha Hydroxy Lotion  Eucerin Original Crme  Eucerin Original Lotion  Eucerin Plus Crme Eucerin Plus Lotion  Eucerin TriLipid Replenishing Lotion  Keri Anti-Bacterial Hand Lotion  Keri Deep Conditioning Original Lotion Dry Skin Formula Softly Scented  Keri Deep Conditioning Original Lotion, Fragrance Free Sensitive Skin Formula  Keri Lotion Fast Absorbing Fragrance Free Sensitive Skin Formula  Keri Lotion Fast Absorbing Softly Scented Dry Skin Formula  Keri Original Lotion  Keri Skin Renewal Lotion Keri Silky Smooth Lotion  Keri Silky Smooth Sensitive Skin Lotion  Nivea Body Creamy Conditioning Oil  Nivea Body Extra Enriched Teacher, adult education Moisturizing Lotion Nivea Crme  Nivea Skin Firming Lotion  NutraDerm 30 Skin  Lotion  NutraDerm Skin Lotion  NutraDerm Therapeutic Skin Cream  NutraDerm Therapeutic Skin Lotion  ProShield Protective Hand Cream  Provon moisturizing lotion   PATIENT SIGNATURE_________________________________  NURSE SIGNATURE__________________________________  ________________________________________________________________________       ________________________________________________________________________

## 2022-12-31 NOTE — Progress Notes (Signed)
COVID Vaccine Completed:  Date of COVID positive in last 90 days:  PCP - Lezlie Lye, MD Cardiologist - Gypsy Balsam, MD Pulmonologist- Jetty Duhamel, MD  Cardiac clearance in Epic dated 08-05-22 Epic  Chest x-ray - 07-08-22 Epic EKG - 07-09-22 Epic, 07-31-22 CEW Stress Test - 05-20-22 Epic ECHO - 07-31-21 Epic Cardiac Cath -  Pacemaker/ICD device last checked: Spinal Cord Stimulator: Long Term Monitor - 10-15-20 Epic  Bowel Prep -   Sleep Study - Yes, +sleep apnea CPAP -   Fasting Blood Sugar -  Checks Blood Sugar _____ times a day  Last dose of GLP1 agonist-  N/A GLP1 instructions:  N/A   Last dose of SGLT-2 inhibitors-  N/A SGLT-2 instructions: N/A   Blood Thinner Instructions:  Time Aspirin Instructions: Last Dose:  Activity level:  Can go up a flight of stairs and perform activities of daily living without stopping and without symptoms of chest pain or shortness of breath.  Able to exercise without symptoms  Unable to go up a flight of stairs without symptoms of     Anesthesia review: Hx of CVA, OSA.  Palpitations evaluated by cardiology  Patient denies shortness of breath, fever, cough and chest pain at PAT appointment  Patient verbalized understanding of instructions that were given to them at the PAT appointment. Patient was also instructed that they will need to review over the PAT instructions again at home before surgery.

## 2023-01-03 NOTE — Progress Notes (Signed)
Sent message, via epic in basket, requesting orders in epic from surgeon.  

## 2023-01-06 NOTE — Progress Notes (Signed)
Second request for pre op orders in [facility]: Spoke with Isa at Dr. Tobin Chad office.

## 2023-01-10 ENCOUNTER — Encounter (HOSPITAL_COMMUNITY): Payer: Self-pay

## 2023-01-10 ENCOUNTER — Other Ambulatory Visit: Payer: Self-pay

## 2023-01-10 ENCOUNTER — Encounter (HOSPITAL_COMMUNITY)
Admission: RE | Admit: 2023-01-10 | Discharge: 2023-01-10 | Disposition: A | Discharge status: Home / Self Care | Payer: 59 | Source: Ambulatory Visit | Attending: Orthopedic Surgery | Admitting: Orthopedic Surgery

## 2023-01-10 ENCOUNTER — Other Ambulatory Visit: Payer: Self-pay | Admitting: Orthopedic Surgery

## 2023-01-10 ENCOUNTER — Ambulatory Visit: Admit: 2023-01-10 | Payer: 59 | Admitting: Oral Surgery

## 2023-01-10 VITALS — BP 135/85 | HR 64 | Temp 98.4°F | Ht 64.0 in | Wt 243.0 lb

## 2023-01-10 DIAGNOSIS — Z01818 Encounter for other preprocedural examination: Secondary | ICD-10-CM

## 2023-01-10 DIAGNOSIS — G8929 Other chronic pain: Secondary | ICD-10-CM

## 2023-01-10 DIAGNOSIS — Z01812 Encounter for preprocedural laboratory examination: Secondary | ICD-10-CM | POA: Diagnosis not present

## 2023-01-10 DIAGNOSIS — E119 Type 2 diabetes mellitus without complications: Secondary | ICD-10-CM | POA: Insufficient documentation

## 2023-01-10 HISTORY — DX: Depression, unspecified: F32.A

## 2023-01-10 HISTORY — DX: Essential (primary) hypertension: I10

## 2023-01-10 HISTORY — DX: Anxiety disorder, unspecified: F41.9

## 2023-01-10 LAB — GLUCOSE, CAPILLARY: Glucose-Capillary: 122 mg/dL — ABNORMAL HIGH (ref 70–99)

## 2023-01-10 LAB — BASIC METABOLIC PANEL
Anion gap: 9 (ref 5–15)
BUN: 18 mg/dL (ref 8–23)
CO2: 25 mmol/L (ref 22–32)
Calcium: 9.7 mg/dL (ref 8.9–10.3)
Chloride: 106 mmol/L (ref 98–111)
Creatinine, Ser: 0.76 mg/dL (ref 0.44–1.00)
GFR, Estimated: >60 mL/min (ref >60)
Glucose, Bld: 122 mg/dL — ABNORMAL HIGH (ref 70–99)
Potassium: 4.2 mmol/L (ref 3.5–5.1)
Sodium: 140 mmol/L (ref 135–145)

## 2023-01-10 LAB — CBC
HCT: 43.7 % (ref 36.0–46.0)
Hemoglobin: 14.3 g/dL (ref 12.0–15.0)
MCH: 29.7 pg (ref 26.0–34.0)
MCHC: 32.7 g/dL (ref 30.0–36.0)
MCV: 90.9 fL (ref 80.0–100.0)
Platelets: 246 10*3/uL (ref 150–400)
RBC: 4.81 MIL/uL (ref 3.87–5.11)
RDW: 13.8 % (ref 11.5–15.5)
WBC: 8.9 10*3/uL (ref 4.0–10.5)
nRBC: 0 % (ref 0.0–0.2)

## 2023-01-10 LAB — SURGICAL PCR SCREEN
MRSA, PCR: NEGATIVE
Staphylococcus aureus: NEGATIVE

## 2023-01-10 SURGERY — DENTAL RESTORATION/EXTRACTIONS
Anesthesia: General

## 2023-01-13 DIAGNOSIS — J449 Chronic obstructive pulmonary disease, unspecified: Secondary | ICD-10-CM | POA: Diagnosis not present

## 2023-01-13 DIAGNOSIS — E785 Hyperlipidemia, unspecified: Secondary | ICD-10-CM | POA: Diagnosis not present

## 2023-01-13 DIAGNOSIS — G43909 Migraine, unspecified, not intractable, without status migrainosus: Secondary | ICD-10-CM | POA: Diagnosis not present

## 2023-01-13 DIAGNOSIS — E1169 Type 2 diabetes mellitus with other specified complication: Secondary | ICD-10-CM | POA: Diagnosis not present

## 2023-01-14 DIAGNOSIS — M170 Bilateral primary osteoarthritis of knee: Secondary | ICD-10-CM | POA: Diagnosis not present

## 2023-01-16 MED ORDER — GENTAMICIN SULFATE 40 MG/ML IJ SOLN
5.0000 mg/kg | INTRAVENOUS | Status: AC
  Administered 2023-01-17: 380 mg via INTRAVENOUS
  Filled 2023-01-16: qty 9.5

## 2023-01-17 ENCOUNTER — Other Ambulatory Visit: Payer: Self-pay

## 2023-01-17 ENCOUNTER — Ambulatory Visit (HOSPITAL_BASED_OUTPATIENT_CLINIC_OR_DEPARTMENT_OTHER): Payer: 59 | Admitting: Certified Registered Nurse Anesthetist

## 2023-01-17 ENCOUNTER — Encounter (HOSPITAL_COMMUNITY): Payer: Self-pay | Admitting: Orthopedic Surgery

## 2023-01-17 ENCOUNTER — Observation Stay (HOSPITAL_COMMUNITY)
Admission: RE | Admit: 2023-01-17 | Discharge: 2023-01-21 | Disposition: A | Discharge status: Home / Self Care | Payer: 59 | Attending: Orthopedic Surgery | Admitting: Orthopedic Surgery

## 2023-01-17 ENCOUNTER — Encounter (HOSPITAL_COMMUNITY)
Admission: RE | Disposition: A | Discharge status: Home / Self Care | Payer: Self-pay | Source: Home / Self Care | Attending: Orthopedic Surgery

## 2023-01-17 ENCOUNTER — Ambulatory Visit (HOSPITAL_COMMUNITY): Payer: 59 | Admitting: Physician Assistant

## 2023-01-17 DIAGNOSIS — G8918 Other acute postprocedural pain: Secondary | ICD-10-CM | POA: Diagnosis not present

## 2023-01-17 DIAGNOSIS — Z8673 Personal history of transient ischemic attack (TIA), and cerebral infarction without residual deficits: Secondary | ICD-10-CM | POA: Diagnosis not present

## 2023-01-17 DIAGNOSIS — E119 Type 2 diabetes mellitus without complications: Secondary | ICD-10-CM | POA: Diagnosis not present

## 2023-01-17 DIAGNOSIS — M1712 Unilateral primary osteoarthritis, left knee: Secondary | ICD-10-CM

## 2023-01-17 DIAGNOSIS — Z7984 Long term (current) use of oral hypoglycemic drugs: Secondary | ICD-10-CM

## 2023-01-17 DIAGNOSIS — I10 Essential (primary) hypertension: Secondary | ICD-10-CM

## 2023-01-17 DIAGNOSIS — Z79899 Other long term (current) drug therapy: Secondary | ICD-10-CM | POA: Diagnosis not present

## 2023-01-17 DIAGNOSIS — Z7982 Long term (current) use of aspirin: Secondary | ICD-10-CM | POA: Insufficient documentation

## 2023-01-17 DIAGNOSIS — Z96659 Presence of unspecified artificial knee joint: Principal | ICD-10-CM

## 2023-01-17 DIAGNOSIS — J45909 Unspecified asthma, uncomplicated: Secondary | ICD-10-CM | POA: Diagnosis not present

## 2023-01-17 DIAGNOSIS — G8929 Other chronic pain: Secondary | ICD-10-CM

## 2023-01-17 HISTORY — PX: TOTAL KNEE ARTHROPLASTY: SHX125

## 2023-01-17 LAB — GLUCOSE, CAPILLARY
Glucose-Capillary: 144 mg/dL — ABNORMAL HIGH (ref 70–99)
Glucose-Capillary: 167 mg/dL — ABNORMAL HIGH (ref 70–99)

## 2023-01-17 SURGERY — ARTHROPLASTY, KNEE, TOTAL
Anesthesia: Spinal | Site: Knee | Laterality: Left

## 2023-01-17 MED ORDER — DEXAMETHASONE SODIUM PHOSPHATE 10 MG/ML IJ SOLN: 8.0000 mg | Freq: Once | INTRAMUSCULAR | Status: DC

## 2023-01-17 MED ORDER — GABAPENTIN 300 MG PO CAPS: 300.0000 mg | ORAL_CAPSULE | Freq: Once | ORAL | Status: DC

## 2023-01-17 MED ORDER — CHLORHEXIDINE GLUCONATE 0.12 % MT SOLN
15.0000 mL | Freq: Once | OROMUCOSAL | Status: AC
  Administered 2023-01-17: 15 mL via OROMUCOSAL

## 2023-01-17 MED ORDER — ONDANSETRON HCL 4 MG/2ML IJ SOLN
INTRAMUSCULAR | Status: AC
  Filled 2023-01-17: qty 2

## 2023-01-17 MED ORDER — PHENOL 1.4 % MT LIQD: 1.0000 | OROMUCOSAL | Status: DC | PRN

## 2023-01-17 MED ORDER — ORAL CARE MOUTH RINSE: 15.0000 mL | Freq: Once | OROMUCOSAL | Status: AC

## 2023-01-17 MED ORDER — DOCUSATE SODIUM 100 MG PO CAPS
100.0000 mg | ORAL_CAPSULE | Freq: Two times a day (BID) | ORAL | Status: DC
  Administered 2023-01-17 – 2023-01-21 (×8): 100 mg via ORAL
  Filled 2023-01-17 (×8): qty 1

## 2023-01-17 MED ORDER — TOPIRAMATE 25 MG PO TABS
50.0000 mg | ORAL_TABLET | Freq: Every day | ORAL | Status: DC
  Administered 2023-01-17 – 2023-01-20 (×4): 50 mg via ORAL
  Filled 2023-01-17 (×4): qty 2

## 2023-01-17 MED ORDER — METHOCARBAMOL 500 MG IVPB - SIMPLE MED
INTRAVENOUS | Status: AC
  Filled 2023-01-17: qty 55

## 2023-01-17 MED ORDER — MENTHOL 3 MG MT LOZG: 1.0000 | LOZENGE | OROMUCOSAL | Status: DC | PRN

## 2023-01-17 MED ORDER — AMISULPRIDE (ANTIEMETIC) 5 MG/2ML IV SOLN: 10.0000 mg | Freq: Once | INTRAVENOUS | Status: DC | PRN

## 2023-01-17 MED ORDER — PROPOFOL 10 MG/ML IV BOLUS
INTRAVENOUS | Status: DC | PRN
Start: 2023-01-17 — End: 2023-01-17
  Administered 2023-01-17: 30 mg via INTRAVENOUS
  Administered 2023-01-17: 20 mg via INTRAVENOUS

## 2023-01-17 MED ORDER — SENNOSIDES-DOCUSATE SODIUM 8.6-50 MG PO TABS: 1.0000 | ORAL_TABLET | Freq: Every evening | ORAL | Status: DC | PRN

## 2023-01-17 MED ORDER — MIDAZOLAM HCL 2 MG/2ML IJ SOLN
INTRAMUSCULAR | Status: AC
  Administered 2023-01-17: 2 mg via INTRAVENOUS
  Filled 2023-01-17: qty 2

## 2023-01-17 MED ORDER — KETOROLAC TROMETHAMINE 30 MG/ML IJ SOLN
30.0000 mg | Freq: Once | INTRAMUSCULAR | Status: AC
  Administered 2023-01-17: 30 mg via INTRAVENOUS

## 2023-01-17 MED ORDER — EMPAGLIFLOZIN 25 MG PO TABS
25.0000 mg | ORAL_TABLET | Freq: Every day | ORAL | Status: DC
  Administered 2023-01-18 – 2023-01-21 (×4): 25 mg via ORAL
  Filled 2023-01-17 (×4): qty 1

## 2023-01-17 MED ORDER — METOCLOPRAMIDE HCL 5 MG/ML IJ SOLN: 5.0000 mg | Freq: Three times a day (TID) | INTRAMUSCULAR | Status: DC | PRN

## 2023-01-17 MED ORDER — 0.9 % SODIUM CHLORIDE (POUR BTL) OPTIME
TOPICAL | Status: DC | PRN
  Administered 2023-01-17: 1000 mL

## 2023-01-17 MED ORDER — PROPOFOL 500 MG/50ML IV EMUL
INTRAVENOUS | Status: DC | PRN
  Administered 2023-01-17: 100 ug/kg/min via INTRAVENOUS

## 2023-01-17 MED ORDER — HYDROXYZINE HCL 50 MG PO TABS: 50.0000 mg | ORAL_TABLET | Freq: Four times a day (QID) | ORAL | Status: DC | PRN

## 2023-01-17 MED ORDER — SODIUM CHLORIDE 0.9 % IR SOLN
Status: DC | PRN
  Administered 2023-01-17: 1000 mL

## 2023-01-17 MED ORDER — HYDROMORPHONE HCL 1 MG/ML IJ SOLN
0.5000 mg | Freq: Once | INTRAMUSCULAR | Status: AC
  Administered 2023-01-17: 0.5 mg via INTRAVENOUS

## 2023-01-17 MED ORDER — BUPIVACAINE IN DEXTROSE 0.75-8.25 % IT SOLN
INTRATHECAL | Status: DC | PRN
  Administered 2023-01-17: 1.6 mL via INTRATHECAL

## 2023-01-17 MED ORDER — ONDANSETRON HCL 4 MG PO TABS: 4.0000 mg | ORAL_TABLET | Freq: Four times a day (QID) | ORAL | Status: DC | PRN

## 2023-01-17 MED ORDER — BISACODYL 5 MG PO TBEC
5.0000 mg | DELAYED_RELEASE_TABLET | Freq: Every day | ORAL | Status: DC | PRN
  Administered 2023-01-20: 5 mg via ORAL
  Filled 2023-01-17: qty 1

## 2023-01-17 MED ORDER — WATER FOR IRRIGATION, STERILE IR SOLN
Status: DC | PRN
  Administered 2023-01-17: 2000 mL

## 2023-01-17 MED ORDER — TRANEXAMIC ACID-NACL 1000-0.7 MG/100ML-% IV SOLN
1000.0000 mg | INTRAVENOUS | Status: AC
  Administered 2023-01-17: 1000 mg via INTRAVENOUS
  Filled 2023-01-17: qty 100

## 2023-01-17 MED ORDER — ALUM & MAG HYDROXIDE-SIMETH 200-200-20 MG/5ML PO SUSP: 30.0000 mL | ORAL | Status: DC | PRN

## 2023-01-17 MED ORDER — BUPIVACAINE-EPINEPHRINE 0.25% -1:200000 IJ SOLN
INTRAMUSCULAR | Status: DC | PRN
  Administered 2023-01-17: 30 mL

## 2023-01-17 MED ORDER — VANCOMYCIN HCL 1500 MG/300ML IV SOLN
1500.0000 mg | INTRAVENOUS | Status: AC
  Administered 2023-01-17: 1500 mg via INTRAVENOUS
  Filled 2023-01-17: qty 300

## 2023-01-17 MED ORDER — DEXAMETHASONE SODIUM PHOSPHATE 10 MG/ML IJ SOLN
10.0000 mg | Freq: Once | INTRAMUSCULAR | Status: AC
  Administered 2023-01-18: 10 mg via INTRAVENOUS
  Filled 2023-01-17: qty 1

## 2023-01-17 MED ORDER — DIPHENHYDRAMINE HCL 12.5 MG/5ML PO ELIX
12.5000 mg | ORAL_SOLUTION | ORAL | Status: DC | PRN
  Administered 2023-01-18: 25 mg via ORAL
  Filled 2023-01-17: qty 10

## 2023-01-17 MED ORDER — FUROSEMIDE 20 MG PO TABS: 20.0000 mg | ORAL_TABLET | Freq: Every day | ORAL | Status: DC | PRN

## 2023-01-17 MED ORDER — FENTANYL CITRATE PF 50 MCG/ML IJ SOSY
PREFILLED_SYRINGE | INTRAMUSCULAR | Status: AC
  Administered 2023-01-17: 50 ug via INTRAVENOUS
  Filled 2023-01-17: qty 3

## 2023-01-17 MED ORDER — CITALOPRAM HYDROBROMIDE 20 MG PO TABS
40.0000 mg | ORAL_TABLET | Freq: Every day | ORAL | Status: DC
  Administered 2023-01-17 – 2023-01-20 (×4): 40 mg via ORAL
  Filled 2023-01-17 (×4): qty 2

## 2023-01-17 MED ORDER — METHOCARBAMOL 500 MG IVPB - SIMPLE MED
500.0000 mg | Freq: Four times a day (QID) | INTRAVENOUS | Status: DC | PRN
  Administered 2023-01-17: 500 mg via INTRAVENOUS
  Filled 2023-01-17: qty 55

## 2023-01-17 MED ORDER — FENTANYL CITRATE PF 50 MCG/ML IJ SOSY: 50.0000 ug | PREFILLED_SYRINGE | Freq: Once | INTRAMUSCULAR | Status: AC

## 2023-01-17 MED ORDER — MIRABEGRON ER 25 MG PO TB24
50.0000 mg | ORAL_TABLET | Freq: Every day | ORAL | Status: DC
  Administered 2023-01-18 – 2023-01-21 (×4): 50 mg via ORAL
  Filled 2023-01-17 (×4): qty 2

## 2023-01-17 MED ORDER — METOCLOPRAMIDE HCL 5 MG PO TABS: 5.0000 mg | ORAL_TABLET | Freq: Three times a day (TID) | ORAL | Status: DC | PRN

## 2023-01-17 MED ORDER — HYDROMORPHONE HCL 1 MG/ML IJ SOLN
0.5000 mg | INTRAMUSCULAR | Status: DC | PRN
  Administered 2023-01-17: 0.5 mg via INTRAVENOUS
  Administered 2023-01-17 – 2023-01-19 (×3): 1 mg via INTRAVENOUS
  Filled 2023-01-17 (×3): qty 1

## 2023-01-17 MED ORDER — MONTELUKAST SODIUM 10 MG PO TABS
10.0000 mg | ORAL_TABLET | Freq: Every day | ORAL | Status: DC
  Administered 2023-01-17 – 2023-01-20 (×4): 10 mg via ORAL
  Filled 2023-01-17 (×4): qty 1

## 2023-01-17 MED ORDER — ASPIRIN 81 MG PO CHEW
81.0000 mg | CHEWABLE_TABLET | Freq: Two times a day (BID) | ORAL | Status: DC
  Administered 2023-01-18 – 2023-01-21 (×7): 81 mg via ORAL
  Filled 2023-01-17 (×7): qty 1

## 2023-01-17 MED ORDER — INSULIN ASPART 100 UNIT/ML IJ SOLN: 0.0000 [IU] | INTRAMUSCULAR | Status: DC | PRN

## 2023-01-17 MED ORDER — PHENYLEPHRINE HCL-NACL 20-0.9 MG/250ML-% IV SOLN
INTRAVENOUS | Status: DC | PRN
  Administered 2023-01-17: 35 ug/min via INTRAVENOUS

## 2023-01-17 MED ORDER — ONDANSETRON HCL 4 MG/2ML IJ SOLN
INTRAMUSCULAR | Status: DC | PRN
Start: 2023-01-17 — End: 2023-01-17
  Administered 2023-01-17: 4 mg via INTRAVENOUS

## 2023-01-17 MED ORDER — DEXAMETHASONE SODIUM PHOSPHATE 10 MG/ML IJ SOLN
INTRAMUSCULAR | Status: DC | PRN
  Administered 2023-01-17: 10 mg

## 2023-01-17 MED ORDER — MOMETASONE FURO-FORMOTEROL FUM 200-5 MCG/ACT IN AERO
2.0000 | INHALATION_SPRAY | Freq: Two times a day (BID) | RESPIRATORY_TRACT | Status: DC
  Administered 2023-01-18 – 2023-01-21 (×7): 2 via RESPIRATORY_TRACT
  Filled 2023-01-17: qty 8.8

## 2023-01-17 MED ORDER — ROSUVASTATIN CALCIUM 20 MG PO TABS
40.0000 mg | ORAL_TABLET | Freq: Every day | ORAL | Status: DC
  Administered 2023-01-17 – 2023-01-20 (×4): 40 mg via ORAL
  Filled 2023-01-17 (×4): qty 2

## 2023-01-17 MED ORDER — PANTOPRAZOLE SODIUM 40 MG PO TBEC
40.0000 mg | DELAYED_RELEASE_TABLET | Freq: Every day | ORAL | Status: DC
  Administered 2023-01-17 – 2023-01-21 (×5): 40 mg via ORAL
  Filled 2023-01-17 (×5): qty 1

## 2023-01-17 MED ORDER — KETOROLAC TROMETHAMINE 30 MG/ML IJ SOLN
INTRAMUSCULAR | Status: AC
  Filled 2023-01-17: qty 1

## 2023-01-17 MED ORDER — SCOPOLAMINE 1 MG/3DAYS TD PT72
MEDICATED_PATCH | TRANSDERMAL | Status: AC
  Administered 2023-01-17: 1.5 mg via TRANSDERMAL
  Filled 2023-01-17: qty 1

## 2023-01-17 MED ORDER — BUPIVACAINE LIPOSOME 1.3 % IJ SUSP: 20.0000 mL | Freq: Once | INTRAMUSCULAR | Status: DC

## 2023-01-17 MED ORDER — FENTANYL CITRATE PF 50 MCG/ML IJ SOSY
25.0000 ug | PREFILLED_SYRINGE | INTRAMUSCULAR | Status: DC | PRN
  Administered 2023-01-17 (×2): 50 ug via INTRAVENOUS

## 2023-01-17 MED ORDER — PROPOFOL 500 MG/50ML IV EMUL
INTRAVENOUS | Status: AC
  Filled 2023-01-17: qty 50

## 2023-01-17 MED ORDER — GABAPENTIN 300 MG PO CAPS
300.0000 mg | ORAL_CAPSULE | Freq: Two times a day (BID) | ORAL | Status: DC
  Administered 2023-01-17 – 2023-01-21 (×9): 300 mg via ORAL
  Filled 2023-01-17 (×9): qty 1

## 2023-01-17 MED ORDER — FENTANYL CITRATE PF 50 MCG/ML IJ SOSY
PREFILLED_SYRINGE | INTRAMUSCULAR | Status: AC
  Administered 2023-01-17: 50 ug via INTRAVENOUS
  Filled 2023-01-17: qty 2

## 2023-01-17 MED ORDER — FLEET ENEMA 7-19 GM/118ML RE ENEM: 1.0000 | ENEMA | Freq: Once | RECTAL | Status: DC | PRN

## 2023-01-17 MED ORDER — PANTOPRAZOLE SODIUM 40 MG PO TBEC: 40.0000 mg | DELAYED_RELEASE_TABLET | Freq: Every day | ORAL | Status: DC

## 2023-01-17 MED ORDER — ALBUTEROL SULFATE HFA 108 (90 BASE) MCG/ACT IN AERS: 2.0000 | INHALATION_SPRAY | RESPIRATORY_TRACT | Status: DC | PRN

## 2023-01-17 MED ORDER — LACTATED RINGERS IV SOLN: INTRAVENOUS | Status: DC

## 2023-01-17 MED ORDER — ONDANSETRON HCL 4 MG/2ML IJ SOLN
4.0000 mg | Freq: Four times a day (QID) | INTRAMUSCULAR | Status: DC | PRN
  Administered 2023-01-17: 4 mg via INTRAVENOUS
  Filled 2023-01-17: qty 2

## 2023-01-17 MED ORDER — OXYCODONE HCL 5 MG PO TABS
5.0000 mg | ORAL_TABLET | ORAL | Status: DC | PRN
  Administered 2023-01-17 – 2023-01-21 (×19): 10 mg via ORAL
  Filled 2023-01-17 (×20): qty 2

## 2023-01-17 MED ORDER — MIDAZOLAM HCL 2 MG/2ML IJ SOLN: 1.0000 mg | Freq: Once | INTRAMUSCULAR | Status: AC

## 2023-01-17 MED ORDER — BUPIVACAINE LIPOSOME 1.3 % IJ SUSP
INTRAMUSCULAR | Status: AC
  Filled 2023-01-17: qty 20

## 2023-01-17 MED ORDER — PROPOFOL 1000 MG/100ML IV EMUL
INTRAVENOUS | Status: AC
  Filled 2023-01-17: qty 100

## 2023-01-17 MED ORDER — FERROUS SULFATE 325 (65 FE) MG PO TABS
325.0000 mg | ORAL_TABLET | Freq: Three times a day (TID) | ORAL | Status: DC
  Administered 2023-01-17 – 2023-01-21 (×11): 325 mg via ORAL
  Filled 2023-01-17 (×12): qty 1

## 2023-01-17 MED ORDER — METHOCARBAMOL 500 MG PO TABS
500.0000 mg | ORAL_TABLET | Freq: Four times a day (QID) | ORAL | Status: DC | PRN
  Administered 2023-01-17 – 2023-01-20 (×10): 500 mg via ORAL
  Filled 2023-01-17 (×11): qty 1

## 2023-01-17 MED ORDER — SODIUM CHLORIDE (PF) 0.9 % IJ SOLN
INTRAMUSCULAR | Status: AC
  Filled 2023-01-17: qty 20

## 2023-01-17 MED ORDER — ALBUTEROL SULFATE (2.5 MG/3ML) 0.083% IN NEBU: 2.5000 mg | INHALATION_SOLUTION | RESPIRATORY_TRACT | Status: DC | PRN

## 2023-01-17 MED ORDER — SCOPOLAMINE 1 MG/3DAYS TD PT72: 1.0000 | MEDICATED_PATCH | TRANSDERMAL | Status: DC

## 2023-01-17 MED ORDER — TEMAZEPAM 15 MG PO CAPS
30.0000 mg | ORAL_CAPSULE | Freq: Every evening | ORAL | Status: DC | PRN
  Administered 2023-01-17 – 2023-01-19 (×3): 30 mg via ORAL
  Filled 2023-01-17 (×3): qty 2

## 2023-01-17 MED ORDER — HYDROMORPHONE HCL 1 MG/ML IJ SOLN
INTRAMUSCULAR | Status: AC
  Filled 2023-01-17: qty 1

## 2023-01-17 MED ORDER — POVIDONE-IODINE 10 % EX SWAB
2.0000 | Freq: Once | CUTANEOUS | Status: AC
  Administered 2023-01-17: 2 via TOPICAL

## 2023-01-17 MED ORDER — SODIUM CHLORIDE 0.9% FLUSH
INTRAVENOUS | Status: DC | PRN
  Administered 2023-01-17: 20 mL

## 2023-01-17 MED ORDER — BUPIVACAINE-EPINEPHRINE 0.25% -1:200000 IJ SOLN
INTRAMUSCULAR | Status: AC
  Filled 2023-01-17: qty 1

## 2023-01-17 MED ORDER — INSULIN ASPART 100 UNIT/ML IJ SOLN
INTRAMUSCULAR | Status: AC
  Administered 2023-01-17: 2 [IU] via SUBCUTANEOUS
  Filled 2023-01-17: qty 1

## 2023-01-17 MED ORDER — MELATONIN 5 MG PO TABS
60.0000 mg | ORAL_TABLET | Freq: Every day | ORAL | Status: DC
  Administered 2023-01-17 – 2023-01-19 (×3): 60 mg via ORAL
  Filled 2023-01-17 (×3): qty 12

## 2023-01-17 MED ORDER — SODIUM CHLORIDE 0.9 % IV SOLN: INTRAVENOUS | Status: DC

## 2023-01-17 MED ORDER — ZOLPIDEM TARTRATE 5 MG PO TABS
5.0000 mg | ORAL_TABLET | Freq: Every evening | ORAL | Status: DC | PRN
  Administered 2023-01-20: 5 mg via ORAL
  Filled 2023-01-17: qty 1

## 2023-01-17 MED ORDER — ONDANSETRON HCL 4 MG/2ML IJ SOLN
4.0000 mg | Freq: Once | INTRAMUSCULAR | Status: AC | PRN
  Administered 2023-01-17: 4 mg via INTRAVENOUS

## 2023-01-17 MED ORDER — BUPIVACAINE LIPOSOME 1.3 % IJ SUSP
INTRAMUSCULAR | Status: DC | PRN
  Administered 2023-01-17: 20 mL

## 2023-01-17 MED ORDER — ACETAMINOPHEN 500 MG PO TABS
1000.0000 mg | ORAL_TABLET | Freq: Once | ORAL | Status: AC
  Administered 2023-01-17: 1000 mg via ORAL
  Filled 2023-01-17: qty 2

## 2023-01-17 MED ORDER — METOPROLOL SUCCINATE ER 50 MG PO TB24
50.0000 mg | ORAL_TABLET | Freq: Every day | ORAL | Status: DC
  Administered 2023-01-18 – 2023-01-21 (×4): 50 mg via ORAL
  Filled 2023-01-17 (×4): qty 1

## 2023-01-17 MED ORDER — ROPIVACAINE HCL 5 MG/ML IJ SOLN
INTRAMUSCULAR | Status: DC | PRN
  Administered 2023-01-17: 20 mL via PERINEURAL

## 2023-01-17 SURGICAL SUPPLY — 54 items
ARTISURF 11M PLY L 6-9CD KNEE (Knees) IMPLANT
BAG COUNTER SPONGE SURGICOUNT (BAG) IMPLANT
BAG SPEC THK2 15X12 ZIP CLS (MISCELLANEOUS) ×1
BAG SPNG CNTER NS LX DISP (BAG)
BAG ZIPLOCK 12X15 (MISCELLANEOUS) ×1 IMPLANT
BLADE SAGITTAL 13X1.27X60 (BLADE) ×1 IMPLANT
BLADE SAW SGTL 18X1.27X75 (BLADE) ×1 IMPLANT
BLADE SURG 15 STRL LF DISP TIS (BLADE) ×1 IMPLANT
BLADE SURG 15 STRL SS (BLADE) ×1
BNDG CMPR 6 X 5 YARDS HK CLSR (GAUZE/BANDAGES/DRESSINGS) ×1
BNDG ELASTIC 6INX 5YD STR LF (GAUZE/BANDAGES/DRESSINGS) ×1 IMPLANT
BOWL SMART MIX CTS (DISPOSABLE) ×1 IMPLANT
BSPLAT TIB 5D D CMNT STM LT (Knees) ×1 IMPLANT
CEMENT BONE R 1X40 (Cement) ×2 IMPLANT
COVER SURGICAL LIGHT HANDLE (MISCELLANEOUS) ×1 IMPLANT
CUFF TOURN SGL QUICK 34 (TOURNIQUET CUFF) ×1
CUFF TRNQT CYL 34X4.125X (TOURNIQUET CUFF) ×1 IMPLANT
DRAPE INCISE IOBAN 66X45 STRL (DRAPES) ×2 IMPLANT
DRAPE U-SHAPE 47X51 STRL (DRAPES) ×1 IMPLANT
DRSG AQUACEL AG ADV 3.5X10 (GAUZE/BANDAGES/DRESSINGS) ×1 IMPLANT
DURAPREP 26ML APPLICATOR (WOUND CARE) ×2 IMPLANT
ELECT REM PT RETURN 15FT ADLT (MISCELLANEOUS) ×1 IMPLANT
FEMUR CMT CCR STD SZ7 L KNEE (Knees) ×1 IMPLANT
FEMUR CMTD CCR STD SZ7 L KNEE (Knees) IMPLANT
GLOVE BIOGEL M 7.0 STRL (GLOVE) IMPLANT
GLOVE BIOGEL PI IND STRL 7.5 (GLOVE) IMPLANT
GLOVE BIOGEL PI IND STRL 8.5 (GLOVE) ×1 IMPLANT
GLOVE SURG ORTHO 8.0 STRL STRW (GLOVE) ×2 IMPLANT
GOWN STRL REUS W/ TWL XL LVL3 (GOWN DISPOSABLE) ×2 IMPLANT
GOWN STRL REUS W/TWL XL LVL3 (GOWN DISPOSABLE) ×2
HANDPIECE INTERPULSE COAX TIP (DISPOSABLE) ×1
HOLDER FOLEY CATH W/STRAP (MISCELLANEOUS) ×1 IMPLANT
HOOD PEEL AWAY T7 (MISCELLANEOUS) ×3 IMPLANT
KIT TURNOVER KIT A (KITS) IMPLANT
MANIFOLD NEPTUNE II (INSTRUMENTS) ×1 IMPLANT
NS IRRIG 1000ML POUR BTL (IV SOLUTION) ×1 IMPLANT
PACK TOTAL KNEE CUSTOM (KITS) ×1 IMPLANT
PROTECTOR NERVE ULNAR (MISCELLANEOUS) ×1 IMPLANT
SET HNDPC FAN SPRY TIP SCT (DISPOSABLE) ×1 IMPLANT
SPIKE FLUID TRANSFER (MISCELLANEOUS) ×2 IMPLANT
STEM POLY PAT PLY 29M KNEE (Knees) IMPLANT
STEM TIBIA 5 DEG SZ D L KNEE (Knees) IMPLANT
STRIP CLOSURE SKIN 1/2X4 (GAUZE/BANDAGES/DRESSINGS) ×1 IMPLANT
SUT BONE WAX W31G (SUTURE) ×1 IMPLANT
SUT MNCRL AB 3-0 PS2 18 (SUTURE) ×1 IMPLANT
SUT STRATAFIX 0 PDS 27 VIOLET (SUTURE) ×1
SUT STRATAFIX 1PDS 45CM VIOLET (SUTURE) ×1 IMPLANT
SUT VIC AB 1 CT1 36 (SUTURE) ×1 IMPLANT
SUTURE STRATFX 0 PDS 27 VIOLET (SUTURE) ×1 IMPLANT
TIBIA STEM 5 DEG SZ D L KNEE (Knees) ×1 IMPLANT
TRAY FOLEY MTR SLVR 16FR STAT (SET/KITS/TRAYS/PACK) ×1 IMPLANT
TUBE SUCTION HIGH CAP CLEAR NV (SUCTIONS) ×1 IMPLANT
WATER STERILE IRR 1000ML POUR (IV SOLUTION) ×2 IMPLANT
WRAP KNEE MAXI GEL POST OP (GAUZE/BANDAGES/DRESSINGS) ×2 IMPLANT

## 2023-01-17 NOTE — Anesthesia Preprocedure Evaluation (Addendum)
Anesthesia Evaluation  Patient identified by MRN, date of birth, ID band Patient awake    Reviewed: Allergy & Precautions, NPO status , Patient's Chart, lab work & pertinent test results, reviewed documented beta blocker date and time   History of Anesthesia Complications (+) PONV and history of anesthetic complications  Airway Mallampati: III  TM Distance: >3 FB Neck ROM: Full    Dental  (+) Dental Advisory Given, Edentulous Lower, Edentulous Upper   Pulmonary asthma , sleep apnea and Continuous Positive Airway Pressure Ventilation    Pulmonary exam normal breath sounds clear to auscultation       Cardiovascular hypertension, Pt. on home beta blockers Normal cardiovascular exam Rhythm:Regular Rate:Normal     Neuro/Psych  Headaches PSYCHIATRIC DISORDERS Anxiety Depression    CVA    GI/Hepatic Neg liver ROS,GERD  Medicated,,  Endo/Other  diabetes, Type 2, Oral Hypoglycemic Agents    Renal/GU negative Renal ROS     Musculoskeletal  (+) Arthritis ,    Abdominal   Peds  Hematology negative hematology ROS (+) Plt 246k   Anesthesia Other Findings Day of surgery medications reviewed with the patient.  Reproductive/Obstetrics                             Anesthesia Physical Anesthesia Plan  ASA: 3  Anesthesia Plan: Spinal   Post-op Pain Management: Regional block* and Tylenol PO (pre-op)*   Induction: Intravenous  PONV Risk Score and Plan: 3 and TIVA, Midazolam, Dexamethasone and Ondansetron  Airway Management Planned: Natural Airway and Simple Face Mask  Additional Equipment:   Intra-op Plan:   Post-operative Plan:   Informed Consent: I have reviewed the patients History and Physical, chart, labs and discussed the procedure including the risks, benefits and alternatives for the proposed anesthesia with the patient or authorized representative who has indicated his/her understanding  and acceptance.     Dental advisory given  Plan Discussed with: CRNA, Anesthesiologist and Surgeon  Anesthesia Plan Comments: (Discussed risks and benefits of and differences between spinal and general. Discussed risks of spinal including headache, backache, failure, bleeding, infection, and nerve damage. Patient consents to spinal. Questions answered. Coagulation studies and platelet count acceptable.)        Anesthesia Quick Evaluation

## 2023-01-17 NOTE — Anesthesia Procedure Notes (Signed)
Procedure Name: MAC Date/Time: 01/17/2023 9:28 AM  Performed by: Orest Dikes, CRNAPre-anesthesia Checklist: Patient identified, Emergency Drugs available, Suction available and Patient being monitored Oxygen Delivery Method: Simple face mask

## 2023-01-17 NOTE — H&P (Signed)
Veronica Leblanc MRN:  [id number] DOB/SEX:  1961-04-11/female  CHIEF COMPLAINT:  Painful left Knee  HISTORY: Patient is a 62 y.o. female presented with a history of pain in the left knee. Onset of symptoms was gradual starting a few years ago with gradually worsening course since that time. Patient has been treated conservatively with over-the-counter NSAIDs and activity modification. Patient currently rates pain in the knee at 10 out of 10 with activity. There is pain at night.  PAST MEDICAL HISTORY: Patient Active Problem List   Diagnosis Date Noted   Migraine 07/09/2022   Morbid (severe) obesity due to excess calories (HCC) 10/26/2021   OSA (obstructive sleep apnea)    History of migraine headaches    CVA (cerebral vascular accident) (HCC)    Arthritis    Nocturnal hypoxemia 05/10/2020   Palpitations 09/18/2019   Dyspnea on exertion 09/18/2019   Late effect of cerebrovascular accident (CVA) 09/18/2019   Type 2 diabetes mellitus without complication, without long-term current use of insulin (HCC) 09/18/2019   Insomnia 08/30/2019   S/P left knee arthroscopy 05/08/2019   Asthma, mild intermittent 03/21/2019   Chronic pain of left knee 09/07/2018   Obstructive sleep apnea 09/09/2010   C V A / STROKE 08/12/2010   ENDOMETRIOSIS 08/12/2010   Past Medical History:  Diagnosis Date   Acute, but ill-defined, cerebrovascular disease    Anxiety    Arthritis    Asthma    Asthma, mild intermittent 03/21/2019   Chronic pain of left knee 09/07/2018   Complication of anesthesia    CVA (cerebral vascular accident) [facility])    CVA (cerebral vascular accident) (HCC)    Age 62   Depression    Dyspnea on exertion 09/18/2019   Eczema    Endometriosis, site unspecified    History of migraine headaches    Hypertension    Insomnia 08/30/2019   Late effect of cerebrovascular accident (CVA) 09/18/2019   Nocturnal hypoxemia 05/10/2020   Obstructive sleep apnea 09/09/2010   PSG from  05/06/10>>AHI 9.6, SpO2 low 77% from [place].   CPAP titration 07/16/10>>CPAP 8 cm from [place].    OSA (obstructive sleep apnea)    Palpitations 09/18/2019   PONV (postoperative nausea and vomiting)    S/P left knee arthroscopy 05/08/2019   Type 2 diabetes mellitus without complication, without long-term current use of insulin (HCC) 09/18/2019   Urticaria    Past Surgical History:  Procedure Laterality Date   APPENDECTOMY     CESAREAN SECTION     CHOLECYSTECTOMY     ganglionectomy     c2/c3 right and left side   NECK SURGERY     5 times   presacral neurectomy     SALPINGOOPHORECTOMY     left   TOOTH EXTRACTION N/A 09/02/2022   Procedure: DENTAL RESTORATION/EXTRACTIONS teeth number seven,eight,nine,ten,twenty one,twenty two,twenty three,twenty four,twenty five,twenty six,twenty seven ,twentyeight twentynine thirty,Alveoloplasty;  Surgeon: [name], [name];  Location: [facility] OR;  Service: Oral Surgery;  Laterality: N/A;   TOTAL ABDOMINAL HYSTERECTOMY       MEDICATIONS:   Medications Prior to Admission  Medication Sig Dispense Refill Last Dose   albuterol (VENTOLIN HFA) 108 (90 Base) MCG/ACT inhaler Inhale 2 puffs into the lungs every 4 (four) hours as needed. 34 g 1    aspirin EC 81 MG tablet Take 81 mg by mouth daily. Swallow whole.      budesonide-formoterol (SYMBICORT) 160-4.5 MCG/ACT inhaler Inhale 2 puffs into the lungs 2 (two) times daily.  calcium carbonate (OS-CAL) 600 MG tablet Take 600 mg by mouth 2 (two) times daily with a meal.      cetirizine (ZYRTEC) 10 MG tablet Take 10 mg by mouth daily.      citalopram (CELEXA) 40 MG tablet Take 40 mg by mouth at bedtime.      cyclobenzaprine (FLEXERIL) 5 MG tablet Take 5-10 mg by mouth 3 (three) times daily as needed for muscle spasms.      diclofenac Sodium (VOLTAREN) 1 % GEL Apply 4 g topically 4 (four) times daily.      empagliflozin (JARDIANCE) 25 MG TABS tablet Take 25 mg by mouth daily.      EPINEPHrine 0.3 mg/0.3 mL IJ  SOAJ injection Use as directed for life-threatening allergic reaction. 2 each 3    furosemide (LASIX) 20 MG tablet Take 20 mg by mouth daily as needed for fluid or edema.      gabapentin (NEURONTIN) 300 MG capsule Take 300 mg by mouth 2 (two) times daily.      hydrOXYzine (VISTARIL) 50 MG capsule Take 50-100 mg by mouth every 6 (six) hours as needed for anxiety.      ibuprofen (ADVIL) 200 MG tablet Take 400-800 mg by mouth every 8 (eight) hours as needed for mild pain or moderate pain.      Melatonin 10 MG TABS Take 60 mg by mouth at bedtime.      metoprolol succinate (TOPROL-XL) 50 MG 24 hr tablet Take 1 tablet (50 mg total) by mouth daily. Take with or immediately following a meal. 90 tablet 3    mirabegron ER (MYRBETRIQ) 50 MG TB24 tablet Take 50 mg by mouth daily.      montelukast (SINGULAIR) 10 MG tablet Take 10 mg by mouth at bedtime.      omeprazole (PRILOSEC) 40 MG capsule Take 40 mg by mouth daily.      ondansetron (ZOFRAN) 4 MG tablet Take 4-8 mg by mouth every 8 (eight) hours as needed for vomiting or nausea.      rosuvastatin (CRESTOR) 40 MG tablet Take 40 mg by mouth at bedtime.      temazepam (RESTORIL) 30 MG capsule Take 1 capsule by mouth at bedtime as needed for sleep. (Patient taking differently: Take 30 mg by mouth at bedtime as needed for sleep.) 30 capsule 5    topiramate (TOPAMAX) 50 MG tablet Take 50 mg by mouth at bedtime.       ALLERGIES:   Allergies  Allergen Reactions   Reglan [Metoclopramide] Hives   Stadol [Butorphanol] Hives    Nasal spray    Amitriptyline Hcl Hives and Swelling   Cefazolin Hives and Swelling   Ceftriaxone Sodium Hives and Swelling   Ciprofloxacin Hives and Swelling   Darvocet [Propoxyphene N-Acetaminophen] Hives and Swelling   Penicillins Hives and Swelling   Prochlorperazine Edisylate Hives and Swelling   Promethazine Hcl     Jaw locks up   Propoxyphene Hives and Swelling   Sulfamethoxazole Swelling   Sulfonamide Derivatives Hives and  Swelling   Sumatriptan Hives and Swelling    REVIEW OF SYSTEMS:  A comprehensive review of systems was negative except for: Musculoskeletal: positive for arthralgias and bone pain   FAMILY HISTORY:   Family History  Problem Relation Age of Onset   Diabetes Father    Hypertension Father    Diabetes Brother        multiple   Hypertension Mother    Hypertension Brother    Breast cancer Maternal  Aunt    Heart disease Maternal Grandfather    Heart disease Paternal Grandfather    Colon cancer Cousin    Pancreatic cancer Maternal Grandmother     SOCIAL HISTORY:   Social History   Tobacco Use   Smoking status: Never   Smokeless tobacco: Never  Substance Use Topics   Alcohol use: Yes    Comment: occas glass of wine     EXAMINATION:  Vital signs in last 24 hours:    LMP  (LMP Unknown)   General Appearance:    Alert, cooperative, no distress, appears stated age  Head:    Normocephalic, without obvious abnormality, atraumatic  Eyes:    PERRL, conjunctiva/corneas clear, EOM's intact, fundi    benign, both eyes  Ears:    Normal TM's and external ear canals, both ears  Nose:   Nares normal, septum midline, mucosa normal, no drainage    or sinus tenderness  Throat:   Lips, mucosa, and tongue normal; teeth and gums normal  Neck:   Supple, symmetrical, trachea midline, no adenopathy;    thyroid:  no enlargement/tenderness/nodules; no carotid   bruit or JVD  Back:     Symmetric, no curvature, ROM normal, no CVA tenderness  Lungs:     Clear to auscultation bilaterally, respirations unlabored  Chest Wall:    No tenderness or deformity   Heart:    Regular rate and rhythm, S1 and S2 normal, no murmur, rub   or gallop  Breast Exam:    No tenderness, masses, or nipple abnormality  Abdomen:     Soft, non-tender, bowel sounds active all four quadrants,    no masses, no organomegaly  Genitalia:    Normal female without lesion, discharge or tenderness  Rectal:    Normal tone, no masses  or tenderness;   guaiac negative stool  Extremities:   Extremities normal, atraumatic, no cyanosis or edema  Pulses:   2+ and symmetric all extremities  Skin:   Skin color, texture, turgor normal, no rashes or lesions  Lymph nodes:   Cervical, supraclavicular, and axillary nodes normal  Neurologic:   CNII-XII intact, normal strength, sensation and reflexes    throughout    Musculoskeletal:  ROM 0-120, Ligaments intact,  Imaging Review Plain radiographs demonstrate severe degenerative joint disease of the left knee. The overall alignment is neutral. The bone quality appears to be good for age and reported activity level.  Assessment/Plan: Primary osteoarthritis, left knee   The patient history, physical examination and imaging studies are consistent with advanced degenerative joint disease of the left knee. The patient has failed conservative treatment.  The clearance notes were reviewed.  After discussion with the patient it was felt that Total Knee Replacement was indicated. The procedure,  risks, and benefits of total knee arthroplasty were presented and reviewed. The risks including but not limited to aseptic loosening, infection, blood clots, vascular injury, stiffness, patella tracking problems complications among others were discussed. The patient acknowledged the explanation, agreed to proceed with the plan.  Preoperative templating of the joint replacement has been completed, documented, and submitted to the Operating Room personnel in order to optimize intra-operative equipment management.    Patient's anticipated LOS is less than 2 midnights, meeting these requirements: - Younger than 27 - Lives within 1 hour of care - Has a competent adult at home to recover with post-op recover - NO history of  - Chronic pain requiring opiods  - Diabetes  - Coronary Artery Disease  -  Heart failure  - Heart attack  - Stroke  - DVT/VTE  - Cardiac arrhythmia  - Respiratory Failure/COPD  -  Renal failure  - Anemia  - Advanced Liver disease     Guy Sandifer 01/17/2023, 7:04 AM

## 2023-01-17 NOTE — Anesthesia Postprocedure Evaluation (Signed)
Anesthesia Post Note  Patient: Veronica Leblanc  Procedure(s) Performed: TOTAL KNEE ARTHROPLASTY (Left: Knee)     Patient location during evaluation: PACU Anesthesia Type: Spinal Level of consciousness: awake, awake and alert and oriented Pain management: pain level controlled Vital Signs Assessment: post-procedure vital signs reviewed and stable Respiratory status: spontaneous breathing, nonlabored ventilation and respiratory function stable Cardiovascular status: blood pressure returned to baseline and stable Postop Assessment: no headache, no backache, spinal receding and no apparent nausea or vomiting Anesthetic complications: no   No notable events documented.  Last Vitals:  Vitals:   01/17/23 1230 01/17/23 1245  BP: 128/69 134/71  Pulse: 79 83  Resp: 10 17  Temp:    SpO2: 93% 99%    Last Pain:  Vitals:   01/17/23 1245  TempSrc:   PainSc: 7                  Collene Schlichter

## 2023-01-17 NOTE — Op Note (Signed)
TOTAL KNEE REPLACEMENT OPERATIVE NOTE:  01/17/2023  11:13 AM  PATIENT:  Veronica Leblanc  62 y.o. female  PRE-OPERATIVE DIAGNOSIS:  Osteoarthritis left knee  POST-OPERATIVE DIAGNOSIS:  Osteoarthritis left knee  PROCEDURE:  Procedure(s): TOTAL KNEE ARTHROPLASTY  SURGEON:  Surgeon(s): Dannielle Huh, MD  PHYSICIAN ASSISTANT: Laurier Nancy, PA-C   ANESTHESIA:   spinal  SPECIMEN: None  COUNTS:  Correct  TOURNIQUET:   Total Tourniquet Time Documented: Thigh (Left) - 39 minutes Total: Thigh (Left) - 39 minutes   DICTATION:  Indication for procedure:    The patient is a 62 y.o. female who has failed conservative treatment for Osteoarthritis left knee.  Informed consent was obtained prior to anesthesia. The risks versus benefits of the operation were explain and in a way the patient can, and did, understand.     Description of procedure:     The patient was taken to the operating room and placed under anesthesia.  The patient was positioned in the usual fashion taking care that all body parts were adequately padded and/or protected.  A tourniquet was applied and the leg prepped and draped in the usual sterile fashion.  The extremity was exsanguinated with the esmarch and tourniquet inflated to 300 mmHg.  Pre-operative range of motion was normal.    A midline incision approximately 6-7 inches long was made with a #10 blade.  A new blade was used to make a parapatellar arthrotomy going 2-3 cm into the quadriceps tendon, over the patella, and alongside the medial aspect of the patellar tendon.  A synovectomy was then performed with the #10 blade and forceps. I then elevated the deep MCL off the medial tibial metaphysis subperiosteally around to the semimembranosus attachment.    I everted the patella and used calipers to measure patellar thickness.  I used the reamer to ream down to appropriate thickness to recreate the native thickness.  I then removed excess bone with the rongeur  and sagittal saw.  I used the appropriately sized template and drilled the three lug holes.  I then put the trial in place and measured the thickness with the calipers to ensure recreation of the native thickness.  The trial was then removed and the patella subluxed and the knee brought into flexion.  A homan retractor was place to retract and protect the patella and lateral structures.  A Z-retractor was place medially to protect the medial structures.  The extra-medullary alignment system was used to make cut the tibial articular surface perpendicular to the anamotic axis of the tibia and in 3 degrees of posterior slope.  The cut surface and alignment jig was removed.  I then used the intramedullary alignment guide to make a 5 valgus cut on the distal femur.  I then marked out the epicondylar axis on the distal femur.  I then used the anterior referencing sizer and measured the femur to be a size 7.  The 4-In-1 cutting block was screwed into place in external rotation matching the posterior condylar angle, making our cuts perpendicular to the epicondylar axis.  Anterior, posterior and chamfer cuts were made with the sagittal saw.  The cutting block and cut pieces were removed.  A lamina spreader was placed in 90 degrees of flexion.  The ACL, PCL, menisci, and posterior condylar osteophytes were removed.  A 11 mm spacer blocked was found to offer good flexion and extension gap balance after minimal in degree releasing.   The scoop retractor was then placed and the femoral finishing  block was pinned in place.  The small sagittal saw was used as well as the lug drill to finish the femur.  The block and cut surfaces were removed and the medullary canal hole filled with autograft bone from the cut pieces.  The tibia was delivered forward in deep flexion and external rotation.  A size D tray was selected and pinned into place centered on the medial 1/3 of the tibial tubercle.  The reamer and keel was used to  prepare the tibia through the tray.    I then trialed with the size 7 femur, size D tibia, a 11 mm insert and the 29 patella.  I had excellent flexion/extension gap balance, excellent patella tracking.  Flexion was full and beyond 120 degrees; extension was zero.  These components were chosen and the staff opened them to me on the back table while the knee was lavaged copiously and the cement mixed.  The soft tissue was infiltrated with 60cc of exparel 1.3% through a 21 gauge needle.  I cemented in the components and removed all excess cement.  The polyethylene tibial component was snapped into place and the knee placed in extension while cement was hardening.  The capsule was infilltrated with a 60cc exparel/marcaine/saline mixture.   Once the cement was hard, the tourniquet was let down.  Hemostasis was obtained.  The arthrotomy was closed using a #1 stratofix running suture.  The deep soft tissues were closed with #0 vicryls and the subcuticular layer closed with #2-0 vicryl.  The skin was reapproximated and closed with 3.0 Monocryl.  The wound was covered with steristrips, aquacel dressing, and a TED stocking.   The patient was then awakened, extubated, and taken to the recovery room in stable condition.  BLOOD LOSS:  300cc COMPLICATIONS:  None.  PLAN OF CARE: Admit for overnight observation  PATIENT DISPOSITION:  PACU - hemodynamically stable.   Delay start of Pharmacological VTE agent (>24hrs) due to surgical blood loss or risk of bleeding:  yes  Please fax a copy of this op note to my office at [phone] (please only include page 1 and 2 of the Case Information op note)

## 2023-01-17 NOTE — Progress Notes (Signed)
Orthopedic Tech Progress Note Patient Details:  Mekhia Casal 23-Oct-1960 [id number] Applied CPM per order. Will remove CPM at 3:45pm and apply bone foam. CPM Left Knee CPM Left Knee: On Left Knee Flexion (Degrees): 90 Left Knee Extension (Degrees): 0  Post Interventions Patient Tolerated: Well Instructions Provided: Adjustment of device, Care of device Ortho Devices Type of Ortho Device: CPM padding Ortho Device/Splint Location: LLE Ortho Device/Splint Interventions: Ordered, Application, Adjustment   Post Interventions Patient Tolerated: Well Instructions Provided: Adjustment of device, Care of device  Blase Mess 01/17/2023, 11:56 AM

## 2023-01-17 NOTE — Anesthesia Procedure Notes (Signed)
Spinal  Patient location during procedure: OR End time: 01/17/2023 9:34 AM Reason for block: surgical anesthesia Staffing Performed: resident/CRNA  Anesthesiologist: Collene Schlichter, MD Resident/CRNA: Orest Dikes, CRNA Performed by: Orest Dikes, CRNA Authorized by: Collene Schlichter, MD   Preanesthetic Checklist Completed: patient identified, IV checked, site marked, risks and benefits discussed, surgical consent, monitors and equipment checked, pre-op evaluation and timeout performed Spinal Block Patient position: sitting Prep: DuraPrep Patient monitoring: heart rate, cardiac monitor, continuous pulse ox and blood pressure Approach: midline Location: L3-4 Injection technique: single-shot Needle Needle type: Pencan  Needle gauge: 24 G Needle length: 10 cm Assessment Sensory level: T4 Events: CSF return Additional Notes IV functioning, monitors applied to pt. Expiration date of kit checked and confirmed to be in date. Sterile prep and drape, hand hygiene and sterile gloves used. Pt was positioned and spine was prepped in sterile fashion. Skin was anesthetized with lidocaine. Free flow of clear CSF obtained prior to injecting local anesthetic into CSF x 1 attempt. Spinal needle aspirated freely following injection. Needle was carefully withdrawn, and pt tolerated procedure well. Loss of motor and sensory on exam post injection. Dr Desmond Lope at bedside for entire placement.

## 2023-01-17 NOTE — Anesthesia Procedure Notes (Signed)
Anesthesia Regional Block: Adductor canal block   Pre-Anesthetic Checklist: , timeout performed,  Correct Patient, Correct Site, Correct Laterality,  Correct Procedure, Correct Position, site marked,  Risks and benefits discussed,  Surgical consent,  Pre-op evaluation,  At surgeon's request and post-op pain management  Laterality: Left  Prep: chloraprep       Needles:  Injection technique: Single-shot  Needle Type: Echogenic Needle     Needle Length: 9cm  Needle Gauge: 21     Additional Needles:   Procedures:,,,, ultrasound used (permanent image in chart),,    Narrative:  Start time: 01/17/2023 8:53 AM End time: 01/17/2023 9:01 AM Injection made incrementally with aspirations every 5 mL.  Performed by: Personally  Anesthesiologist: Collene Schlichter, MD  Additional Notes: No pain on injection. No increased resistance to injection. Injection made in 5cc increments.  Good needle visualization.  Patient tolerated procedure well.

## 2023-01-17 NOTE — Transfer of Care (Signed)
Immediate Anesthesia Transfer of Care Note  Patient: Veronica Leblanc  Procedure(s) Performed: TOTAL KNEE ARTHROPLASTY (Left: Knee)  Patient Location: PACU  Anesthesia Type:Spinal  Level of Consciousness: drowsy  Airway & Oxygen Therapy: Patient Spontanous Breathing and Patient connected to face mask oxygen  Post-op Assessment: Report given to RN and Post -op Vital signs reviewed and stable  Post vital signs: Reviewed and stable  Last Vitals:  Vitals Value Taken Time  BP    Temp    Pulse 83 01/17/23 1103  Resp 17 01/17/23 1103  SpO2 100 % 01/17/23 1103  Vitals shown include unfiled device data.  Last Pain:  Vitals:   01/17/23 0905  TempSrc:   PainSc: 0-No pain         Complications: No notable events documented.

## 2023-01-17 NOTE — Evaluation (Signed)
Physical Therapy Evaluation Patient Details Name: Veronica Leblanc MRN: [id number] DOB: 02/11/1961 Today's Date: 01/17/2023  History of Present Illness  62 y.o. female admitted 01/17/23 for L TKA. PMH: morbid obesity, OSA, CVA, DM.  Clinical Impression  Pt is s/p TKA resulting in the deficits listed below (see PT Problem List). Min assist for supine to sit and sit to stand. Pt reported dizziness in standing so ambulation wasn't attempted but she was able to pivot to the recliner with a RW. Initiated TKA HEP. Good progress expected.  Pt will benefit from acute skilled PT to increase their independence and safety with mobility to allow discharge.          If plan is discharge home, recommend the following: A little help with walking and/or transfers;A little help with bathing/dressing/bathroom;Assistance with cooking/housework;Assist for transportation;Help with stairs or ramp for entrance   Can travel by private vehicle        Equipment Recommendations None recommended by PT  Recommendations for Other Services       Functional Status Assessment Patient has had a recent decline in their functional status and demonstrates the ability to make significant improvements in function in a reasonable and predictable amount of time.     Precautions / Restrictions Precautions Precautions: Fall;Knee Precaution Comments: pt reports h/o several falls in past 6 months 2* L knee buckling Restrictions Weight Bearing Restrictions: No Other Position/Activity Restrictions: WBAT      Mobility  Bed Mobility Overal bed mobility: Needs Assistance Bed Mobility: Supine to Sit     Supine to sit: Min assist     General bed mobility comments: assist for LLE to EOB    Transfers Overall transfer level: Needs assistance Equipment used: Rolling walker (2 wheels) Transfers: Sit to/from Stand, Bed to chair/wheelchair/BSC Sit to Stand: From elevated surface, Min assist   Step pivot transfers: Min  guard       General transfer comment: min A to power up, VCs hand placement; pt dizzy in standing so just took a few pivotal steps to recliner    Ambulation/Gait                  Stairs            Wheelchair Mobility     Tilt Bed    Modified Rankin (Stroke Patients Only)       Balance Overall balance assessment: Needs assistance Sitting-balance support: Feet supported, No upper extremity supported Sitting balance-Leahy Scale: Good     Standing balance support: Bilateral upper extremity supported, During functional activity, Reliant on assistive device for balance Standing balance-Leahy Scale: Poor                               Pertinent Vitals/Pain Pain Assessment Pain Assessment: 0-10 Pain Score: 4  Pain Location: L knee Pain Descriptors / Indicators: Sore Pain Intervention(s): Limited activity within patient's tolerance, Monitored during session, Premedicated before session, Ice applied    Home Living Family/patient expects to be discharged to:: Private residence Living Arrangements: Alone Available Help at Discharge: Family;Available 24 hours/day Type of Home: House Home Access: Level entry       Home Layout: One level Home Equipment: Agricultural consultant (2 wheels);BSC/3in1;Tub bench Additional Comments: daughter to stay with pt 24/7 for first several days    Prior Function Prior Level of Function : Independent/Modified Independent             Mobility  Comments: walked with cane PTA, had several falls in past 6 months 2* LLE buckling ADLs Comments: independent     Hand Dominance        Extremity/Trunk Assessment   Upper Extremity Assessment Upper Extremity Assessment: Overall WFL for tasks assessed;RUE deficits/detail RUE Deficits / Details: 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, stated she is able to do all ADLs with RUE    Lower Extremity Assessment Lower Extremity Assessment: LLE deficits/detail LLE Deficits / Details: knee  ext -3/5, SLR 2/5 LLE Sensation: WNL LLE Coordination: WNL    Cervical / Trunk Assessment Cervical / Trunk Assessment: Normal  Communication   Communication: No difficulties  Cognition Arousal/Alertness: Awake/alert Behavior During Therapy: WFL for tasks assessed/performed Overall Cognitive Status: Within Functional Limits for tasks assessed                                          General Comments      Exercises Total Joint Exercises Ankle Circles/Pumps: AROM, Both, 10 reps, Supine Long Arc Quad: AROM, Left, 5 reps, Seated Knee Flexion: AAROM, Left, 5 reps, Supine, Limitations Knee Flexion Limitations: using gait belt   Assessment/Plan    PT Assessment Patient needs continued PT services  PT Problem List Decreased strength;Decreased range of motion;Decreased activity tolerance;Decreased mobility;Obesity;Pain       PT Treatment Interventions Gait training;Therapeutic exercise;DME instruction;Therapeutic activities;Patient/family education    PT Goals (Current goals can be found in the Care Plan section)  Acute Rehab PT Goals Patient Stated Goal: to walk PT Goal Formulation: With patient/family Time For Goal Achievement: 01/24/23 Potential to Achieve Goals: Good    Frequency 7X/week     Co-evaluation               AM-PAC PT "6 Clicks" Mobility  Outcome Measure Help needed turning from your back to your side while in a flat bed without using bedrails?: A Little Help needed moving from lying on your back to sitting on the side of a flat bed without using bedrails?: A Little Help needed moving to and from a bed to a chair (including a wheelchair)?: A Little Help needed standing up from a chair using your arms (e.g., wheelchair or bedside chair)?: A Little Help needed to walk in hospital room?: A Little Help needed climbing 3-5 steps with a railing? : A Lot 6 Click Score: 17    End of Session Equipment Utilized During Treatment: Gait  belt Activity Tolerance: Patient tolerated treatment well Patient left: in chair;with chair alarm set;with call bell/phone within reach;with family/visitor present Nurse Communication: Mobility status PT Visit Diagnosis: Muscle weakness (generalized) (M62.81);History of falling (Z91.81);Other abnormalities of gait and mobility (R26.89);Pain Pain - Right/Left: Left Pain - part of body: Knee    Time: 1510-1550 PT Time Calculation (min) (ACUTE ONLY): 40 min   Charges:   PT Evaluation $PT Eval Moderate Complexity: 1 Mod PT Treatments $Therapeutic Exercise: 8-22 mins $Therapeutic Activity: 8-22 mins PT General Charges $$ ACUTE PT VISIT: 1 Visit        Quatesha, Fochtman PT 01/17/2023  Acute Rehabilitation Services  Office [phone]

## 2023-01-18 ENCOUNTER — Encounter (HOSPITAL_COMMUNITY): Payer: Self-pay | Admitting: Orthopedic Surgery

## 2023-01-18 DIAGNOSIS — J45909 Unspecified asthma, uncomplicated: Secondary | ICD-10-CM | POA: Diagnosis not present

## 2023-01-18 DIAGNOSIS — I10 Essential (primary) hypertension: Secondary | ICD-10-CM | POA: Diagnosis not present

## 2023-01-18 DIAGNOSIS — Z79899 Other long term (current) drug therapy: Secondary | ICD-10-CM | POA: Diagnosis not present

## 2023-01-18 DIAGNOSIS — Z8673 Personal history of transient ischemic attack (TIA), and cerebral infarction without residual deficits: Secondary | ICD-10-CM | POA: Diagnosis not present

## 2023-01-18 DIAGNOSIS — Z7982 Long term (current) use of aspirin: Secondary | ICD-10-CM | POA: Diagnosis not present

## 2023-01-18 DIAGNOSIS — M1712 Unilateral primary osteoarthritis, left knee: Secondary | ICD-10-CM | POA: Diagnosis not present

## 2023-01-18 DIAGNOSIS — E119 Type 2 diabetes mellitus without complications: Secondary | ICD-10-CM | POA: Diagnosis not present

## 2023-01-18 MED ORDER — DEXAMETHASONE SODIUM PHOSPHATE 10 MG/ML IJ SOLN
8.0000 mg | Freq: Once | INTRAMUSCULAR | Status: AC
  Administered 2023-01-18: 8 mg via INTRAVENOUS
  Filled 2023-01-18: qty 1

## 2023-01-18 MED ORDER — ORAL CARE MOUTH RINSE: 15.0000 mL | OROMUCOSAL | Status: DC | PRN

## 2023-01-18 MED ORDER — DIPHENHYDRAMINE HCL 25 MG PO CAPS
50.0000 mg | ORAL_CAPSULE | ORAL | Status: AC
  Administered 2023-01-18: 50 mg via ORAL
  Filled 2023-01-18: qty 2

## 2023-01-18 NOTE — Progress Notes (Signed)
Physical Therapy Treatment Patient Details Name: Ramie Besel MRN: [id number] DOB: 05-05-1961 Today's Date: 01/18/2023   History of Present Illness 62 y.o. female admitted 01/17/23 for L TKA. PMH: morbid obesity, OSA, CVA, DM.    PT Comments  Pt making slow progress. She did ambulate 25' in hallway but needing min A, fatigued easily with feeling of buckling so returned to sitting.  Continue to progress as able.     If plan is discharge home, recommend the following: A little help with walking and/or transfers;A little help with bathing/dressing/bathroom;Assistance with cooking/housework;Assist for transportation;Help with stairs or ramp for entrance   Can travel by private vehicle        Equipment Recommendations  None recommended by PT    Recommendations for Other Services       Precautions / Restrictions Precautions Precautions: Fall;Knee Precaution Comments: pt reports h/o several falls in past 6 months 2* L knee buckling Restrictions Weight Bearing Restrictions: No Other Position/Activity Restrictions: WBAT     Mobility  Bed Mobility Overal bed mobility: Needs Assistance Bed Mobility: Supine to Sit     Supine to sit: Min assist     General bed mobility comments: assist for LLE to EOB    Transfers Overall transfer level: Needs assistance Equipment used: Rolling walker (2 wheels) Transfers: Sit to/from Stand Sit to Stand: From elevated surface, Min assist   Step pivot transfers: Min assist       General transfer comment: Min A to stand with cues for hand placement and L LE management.  Perfomred x 2    Ambulation/Gait Ambulation/Gait assistance: Min assist Gait Distance (Feet): 25 Feet Assistive device: Rolling walker (2 wheels) Gait Pattern/deviations: Step-to pattern, Decreased stance time - left, Decreased weight shift to left Gait velocity: decreased significantly     General Gait Details: Light min A to steady with fatigue.  After about 25 '  reports feels like L leg buckling and did demonstrate some instability so returned to sitting in available desk chair.  Rolled next to recliner and after recovery -step pivot to chair   Stairs             Wheelchair Mobility     Tilt Bed    Modified Rankin (Stroke Patients Only)       Balance Overall balance assessment: Needs assistance Sitting-balance support: Feet supported, No upper extremity supported Sitting balance-Leahy Scale: Good     Standing balance support: Bilateral upper extremity supported, During functional activity, Reliant on assistive device for balance Standing balance-Leahy Scale: Poor Standing balance comment: RW and min A                            Cognition Arousal/Alertness: Awake/alert Behavior During Therapy: WFL for tasks assessed/performed Overall Cognitive Status: No family/caregiver present to determine baseline cognitive functioning (dtr sleeping)                                 General Comments: overall WFL but did need repeated cues at times and increased time        Exercises Total Joint Exercises Ankle Circles/Pumps: AROM, Both, 10 reps, Supine Quad Sets: AROM, Both, 10 reps, Supine Long Arc Quad: AAROM, 5 reps, Left, Seated Knee Flexion: AAROM, Left, 5 reps, Seated Goniometric ROM: L knee ~10 to 50 degrees Other Exercises Other Exercises: LImited exercises and with assist due to pain  General Comments        Pertinent Vitals/Pain Pain Assessment Pain Assessment: 0-10 Pain Score: 8  Pain Location: L knee Pain Descriptors / Indicators: Sore Pain Intervention(s): Limited activity within patient's tolerance, Monitored during session, Premedicated before session, Ice applied    Home Living                          Prior Function            PT Goals (current goals can now be found in the care plan section) Progress towards PT goals: Progressing toward goals    Frequency     7X/week      PT Plan Current plan remains appropriate    Co-evaluation              AM-PAC PT "6 Clicks" Mobility   Outcome Measure  Help needed turning from your back to your side while in a flat bed without using bedrails?: A Little Help needed moving from lying on your back to sitting on the side of a flat bed without using bedrails?: A Little Help needed moving to and from a bed to a chair (including a wheelchair)?: A Little Help needed standing up from a chair using your arms (e.g., wheelchair or bedside chair)?: A Little Help needed to walk in hospital room?: A Little Help needed climbing 3-5 steps with a railing? : Total 6 Click Score: 16    End of Session Equipment Utilized During Treatment: Gait belt Activity Tolerance: Patient limited by pain Patient left: in chair;with chair alarm set;with call bell/phone within reach;with family/visitor present Nurse Communication: Mobility status PT Visit Diagnosis: Muscle weakness (generalized) (M62.81);History of falling (Z91.81);Other abnormalities of gait and mobility (R26.89);Pain Pain - Right/Left: Left Pain - part of body: Knee     Time: 0981-1914 PT Time Calculation (min) (ACUTE ONLY): 29 min  Charges:    $Gait Training: 8-22 mins $Therapeutic Exercise: 8-22 mins PT General Charges $$ ACUTE PT VISIT: 1 Visit                     Anise Salvo, PT Acute Rehab [facility] [phone]    Rayetta Humphrey 01/18/2023, 12:32 PM

## 2023-01-18 NOTE — Progress Notes (Signed)
Patient c/o possible allergic reaction after eating salad w/ ranch dressing. VSS, lung sounds normal. Patient reports feeling itchy to face, throat and chest, and inside mouth and throat. Administered po diphenhydramine but patient reports she usually takes 75mg  when reactions occur. Dr Sherlean Foot notified, rec'd order for one time dose of additional diphenhydramine. Will monitor for effect.

## 2023-01-18 NOTE — TOC Transition Note (Signed)
Transition of Care [facility]) - CM/SW Discharge Note   Patient Details  Name: Veronica Leblanc MRN: [id number] Date of Birth: [birth year]  [facility] CM/SW Contact:  Amada Jupiter, LCSW Phone Number: 01/18/2023, 10:16 AM   Clinical Narrative:     Met with pt who confirms she has needed DME in the home.  OPPT already arranged with [facility].  No further TOC needs.  Final next level of care: OP Rehab Barriers to Discharge: No Barriers Identified   Patient Goals and CMS Choice      Discharge Placement                         Discharge Plan and Services Additional resources added to the After Visit Summary for                  DME Arranged: N/A DME Agency: NA                  Social Determinants of Health (SDOH) Interventions SDOH Screenings   Food Insecurity: No Food Insecurity (01/17/2023)  Housing: Low Risk  (01/17/2023)  Transportation Needs: No Transportation Needs (01/17/2023)  Utilities: Not At Risk (01/17/2023)  Tobacco Use: Low Risk  (01/17/2023)     Readmission Risk Interventions     No data to display

## 2023-01-18 NOTE — Progress Notes (Signed)
   01/18/23 2234  BiPAP/CPAP/SIPAP  BiPAP/CPAP/SIPAP Pt Type Adult  BiPAP/CPAP/SIPAP DREAMSTATIOND  Mask Type Full face mask (home mask)  Mask Size Medium  FiO2 (%) 21 %  Patient Home Equipment No  Auto Titrate Yes (5-20)

## 2023-01-18 NOTE — Progress Notes (Signed)
Physical Therapy Treatment Patient Details Name: Veronica Leblanc MRN: [id number] DOB: [birth year] Today's Date: 01/18/2023   History of Present Illness 62 y.o. female admitted 01/17/23 for L TKA. PMH: morbid obesity, OSA, CVA, DM.    PT Comments  Pt making good improvements this afternoon and ambulating 45' with RW and chair follow.  Requiring min A for transfers at times.  She did not have buckling sensation this afternoon. Pt does report long hallway to ambulate to her apartment but no stairs.   Recommend further therapy prior to d/c home.    If plan is discharge home, recommend the following: A little help with walking and/or transfers;A little help with bathing/dressing/bathroom;Assistance with cooking/housework;Assist for transportation;Help with stairs or ramp for entrance   Can travel by private vehicle        Equipment Recommendations  None recommended by PT    Recommendations for Other Services       Precautions / Restrictions Precautions Precautions: Fall;Knee Precaution Comments: pt reports h/o several falls in past 6 months 2* L knee buckling Restrictions Other Position/Activity Restrictions: WBAT     Mobility  Bed Mobility Overal bed mobility: Needs Assistance Bed Mobility: Supine to Sit     Supine to sit: Min assist     General bed mobility comments: assist for LLE to EOB    Transfers Overall transfer level: Needs assistance Equipment used: Rolling walker (2 wheels) Transfers: Sit to/from Stand Sit to Stand: Min guard   Step pivot transfers: Min assist       General transfer comment: Min guard from bed and recliner; good recall of hand placement    Ambulation/Gait Ambulation/Gait assistance: Min guard Gait Distance (Feet): 45 Feet (45'x2) Assistive device: Rolling walker (2 wheels) Gait Pattern/deviations: Step-to pattern, Decreased stance time - left, Decreased weight shift to left Gait velocity: decreased significantly     General Gait  Details: Improved stability, min cues for sequencing, chair follow with seated rest break   Stairs             Wheelchair Mobility     Tilt Bed    Modified Rankin (Stroke Patients Only)       Balance Overall balance assessment: Needs assistance Sitting-balance support: Feet supported, No upper extremity supported Sitting balance-Leahy Scale: Good     Standing balance support: Bilateral upper extremity supported, During functional activity, Reliant on assistive device for balance Standing balance-Leahy Scale: Poor Standing balance comment: RW and min G                            Cognition Arousal/Alertness: Awake/alert Behavior During Therapy: WFL for tasks assessed/performed Overall Cognitive Status: Within Functional Limits for tasks assessed                                 General Comments: overall WFL but did need repeated cues at times and increased time        Exercises Total Joint Exercises Ankle Circles/Pumps: AROM, Both, 10 reps, Supine Quad Sets: AROM, Both, 10 reps, Supine Long Arc Quad: AAROM, Left, Seated, 10 reps Knee Flexion: AAROM, Left, Seated, 10 reps Goniometric ROM: L knee ~10 to 50 degrees Other Exercises Other Exercises: LImited exercises and with assist due to pain    General Comments General comments (skin integrity, edema, etc.): REports elevator to enter apartment but then long hallway (estimating 75')  Pertinent Vitals/Pain Pain Assessment Pain Assessment: 0-10 Pain Score: 7  Pain Location: L knee Pain Descriptors / Indicators: Sore Pain Intervention(s): Limited activity within patient's tolerance, Monitored during session, Premedicated before session, Repositioned, Ice applied    Home Living                          Prior Function            PT Goals (current goals can now be found in the care plan section) Progress towards PT goals: Progressing toward goals    Frequency     7X/week      PT Plan Current plan remains appropriate    Co-evaluation              AM-PAC PT "6 Clicks" Mobility   Outcome Measure  Help needed turning from your back to your side while in a flat bed without using bedrails?: A Little Help needed moving from lying on your back to sitting on the side of a flat bed without using bedrails?: A Little Help needed moving to and from a bed to a chair (including a wheelchair)?: A Little Help needed standing up from a chair using your arms (e.g., wheelchair or bedside chair)?: A Little Help needed to walk in hospital room?: A Little Help needed climbing 3-5 steps with a railing? : A Lot 6 Click Score: 17    End of Session Equipment Utilized During Treatment: Gait belt Activity Tolerance: Patient tolerated treatment well Patient left: in chair;with chair alarm set;with call bell/phone within reach;with family/visitor present Nurse Communication: Mobility status PT Visit Diagnosis: Muscle weakness (generalized) (M62.81);History of falling (Z91.81);Other abnormalities of gait and mobility (R26.89);Pain Pain - Right/Left: Left Pain - part of body: Knee     Time: 1324-4010 PT Time Calculation (min) (ACUTE ONLY): 35 min  Charges:    $Gait Training: 8-22 mins $Therapeutic Exercise: 8-22 mins PT General Charges $$ ACUTE PT VISIT: 1 Visit                     Anise Salvo, PT Acute Rehab [facility] [phone]    Rayetta Humphrey 01/18/2023, 4:34 PM

## 2023-01-18 NOTE — Progress Notes (Signed)
SPORTS MEDICINE AND JOINT REPLACEMENT  Veronica Spurling, MD    Veronica Nancy, PA-C [address] [place], [place], [place]  [address]                             [phone]   PROGRESS NOTE  Subjective:  negative for Chest Pain  negative for Shortness of Breath  negative for Nausea/Vomiting   negative for Calf Pain  negative for Bowel Movement   Tolerating Diet: yes         Patient reports pain as 5 on 0-10 scale.    Objective: Vital signs in last 24 hours:   Patient Vitals for the past 24 hrs:  BP Temp Temp src Pulse Resp SpO2 Height Weight  01/18/23 1114 121/60 98.8 F (37.1 C) -- 96 18 92 % -- --  01/18/23 0828 -- -- -- -- -- 96 % -- --  01/18/23 0539 125/63 98.9 F (37.2 C) -- 85 17 97 % -- --  01/18/23 0138 124/77 98.4 F (36.9 C) Oral 84 16 96 % -- --  01/17/23 2234 109/64 98.7 F (37.1 C) Oral 81 16 94 % -- --  01/17/23 1717 (!) 108/51 98.2 F (36.8 C) -- 87 16 94 % -- --  01/17/23 1501 133/76 98 F (36.7 C) -- 84 16 95 % 5' 4.02" (1.626 m) 110.2 kg  01/17/23 1400 120/62 98.4 F (36.9 C) -- 77 13 96 % -- --  01/17/23 1315 124/67 -- -- 78 14 96 % -- --  01/17/23 1300 133/64 -- -- 79 13 96 % -- --  01/17/23 1245 134/71 -- -- 83 17 99 % -- --    @flow {1959:LAST@   Intake/Output from previous day:   08/05 0701 - 08/06 0700 In: 1533.1 [P.O.:630; I.V.:638.6] Out: 3375 [Urine:3350]   Intake/Output this shift:   No intake/output data recorded.   Intake/Output      08/05 0701 08/06 0700 08/06 0701 08/07 0700   P.O. 630    I.V. (mL/kg) 638.6 (5.8)    IV Piggyback 264.5    Total Intake(mL/kg) 1533.1 (13.9)    Urine (mL/kg/hr) 3350 (1.3)    Blood 25    Total Output 3375    Net -1841.9            LABORATORY DATA: No results for input(s): "WBC", "HGB", "HCT", "PLT" in the last 168 hours. No results for input(s): "NA", "K", "CL", "CO2", "BUN", "CREATININE", "GLUCOSE", "CALCIUM" in the last 168 hours. Lab Results  Component Value Date   INR 1.1 07/08/2022     Examination:  General appearance: alert, cooperative, and no distress Extremities: extremities normal, atraumatic, no cyanosis or edema  Wound Exam: clean, dry, intact   Drainage:  None: wound tissue dry  Motor Exam: Quadriceps and Hamstrings Intact  Sensory Exam: Superficial Peroneal, Deep Peroneal, and Tibial normal   Assessment:    1 Day Post-Op  Procedure(s) (LRB): TOTAL KNEE ARTHROPLASTY (Left)  ADDITIONAL DIAGNOSIS:  Principal Problem:   S/P total knee replacement     Plan: Physical Therapy as ordered Weight Bearing as Tolerated (WBAT)  DVT Prophylaxis:  Aspirin  DISCHARGE PLAN: Home   Patient struggling with pain control, plan on PT this afternoon and again tomorrow with D/C tomorrow afternoon 01/18/2023, 12:30 PM

## 2023-01-18 NOTE — Care Management Obs Status (Signed)
MEDICARE OBSERVATION STATUS NOTIFICATION   Patient Details  Name: Veronica Leblanc MRN: [id number] Date of Birth: 06-Jan-1961   Medicare Observation Status Notification Given:  Hart Robinsons, LCSW 01/18/2023, 2:43 PM

## 2023-01-19 ENCOUNTER — Encounter (HOSPITAL_COMMUNITY): Payer: Self-pay | Admitting: Orthopedic Surgery

## 2023-01-19 DIAGNOSIS — Z7982 Long term (current) use of aspirin: Secondary | ICD-10-CM | POA: Diagnosis not present

## 2023-01-19 DIAGNOSIS — M1712 Unilateral primary osteoarthritis, left knee: Secondary | ICD-10-CM | POA: Diagnosis not present

## 2023-01-19 DIAGNOSIS — I10 Essential (primary) hypertension: Secondary | ICD-10-CM | POA: Diagnosis not present

## 2023-01-19 DIAGNOSIS — E119 Type 2 diabetes mellitus without complications: Secondary | ICD-10-CM | POA: Diagnosis not present

## 2023-01-19 DIAGNOSIS — Z79899 Other long term (current) drug therapy: Secondary | ICD-10-CM | POA: Diagnosis not present

## 2023-01-19 DIAGNOSIS — J45909 Unspecified asthma, uncomplicated: Secondary | ICD-10-CM | POA: Diagnosis not present

## 2023-01-19 DIAGNOSIS — Z8673 Personal history of transient ischemic attack (TIA), and cerebral infarction without residual deficits: Secondary | ICD-10-CM | POA: Diagnosis not present

## 2023-01-19 MED ORDER — ASPIRIN 81 MG PO CHEW: 81.0000 mg | CHEWABLE_TABLET | Freq: Two times a day (BID) | ORAL | 0 refills | Status: AC

## 2023-01-19 MED ORDER — METHOCARBAMOL 500 MG PO TABS: 500.0000 mg | ORAL_TABLET | Freq: Four times a day (QID) | ORAL | 0 refills | Status: AC | PRN

## 2023-01-19 MED ORDER — OXYCODONE HCL 5 MG PO TABS: 5.0000 mg | ORAL_TABLET | Freq: Four times a day (QID) | ORAL | 0 refills | Status: AC | PRN

## 2023-01-19 NOTE — Discharge Summary (Addendum)
SPORTS MEDICINE & JOINT REPLACEMENT   Georgena Spurling, MD   Laurier Nancy, PA-C [address] [place], [place], [place]  [address]                             [phone]  PATIENT ID: Veronica Leblanc        MRN:  [id number]          DOB/AGE: 21-May-1961 / 62 y.o.    DISCHARGE SUMMARY  ADMISSION DATE:    01/17/2023 DISCHARGE DATE:   01/21/2023   ADMISSION DIAGNOSIS: S/P total knee replacement [Z96.659]    DISCHARGE DIAGNOSIS:  Osteoarthritis left knee    ADDITIONAL DIAGNOSIS: Principal Problem:   S/P total knee replacement  Past Medical History:  Diagnosis Date   Acute, but ill-defined, cerebrovascular disease    Anxiety    Arthritis    Asthma    Asthma, mild intermittent 03/21/2019   Chronic pain of left knee 09/07/2018   Complication of anesthesia    CVA (cerebral vascular accident) [facility])    CVA (cerebral vascular accident) (HCC)    Age 62   Depression    Dyspnea on exertion 09/18/2019   Eczema    Endometriosis, site unspecified    History of migraine headaches    Hypertension    Insomnia 08/30/2019   Late effect of cerebrovascular accident (CVA) 09/18/2019   Nocturnal hypoxemia 05/10/2020   Obstructive sleep apnea 09/09/2010   PSG from 05/06/10>>AHI 9.6, SpO2 low 77% from [place].   CPAP titration 07/16/10>>CPAP 8 cm from [place].    OSA (obstructive sleep apnea)    Palpitations 09/18/2019   PONV (postoperative nausea and vomiting)    S/P left knee arthroscopy 05/08/2019   Type 2 diabetes mellitus without complication, without long-term current use of insulin (HCC) 09/18/2019   Urticaria     PROCEDURE: Procedure(s): TOTAL KNEE ARTHROPLASTY on 01/17/2023  CONSULTS:    HISTORY:  See H&P in chart  HOSPITAL COURSE:  Veronica Leblanc is a 62 y.o. admitted on 01/17/2023 and found to have a diagnosis of Osteoarthritis left knee.  After appropriate laboratory studies were obtained  they were taken to the operating room on 01/17/2023 and underwent Procedure(s): TOTAL  KNEE ARTHROPLASTY.   They were given perioperative antibiotics:  Anti-infectives (From admission, onward)    Start     Dose/Rate Route Frequency Ordered Stop   01/17/23 0845  vancomycin (VANCOREADY) IVPB 1500 mg/300 mL        1,500 mg 150 mL/hr over 120 Minutes Intravenous On call to O.R. 01/17/23 0737 01/17/23 1112   01/17/23 0845  gentamicin (GARAMYCIN) 380 mg in dextrose 5 % 100 mL IVPB        5 mg/kg  76.9 kg (Adjusted) 109.5 mL/hr over 60 Minutes Intravenous On call to O.R. 01/16/23 8657 01/17/23 1112     .  Patient given tranexamic acid IV or topical and exparel intra-operatively.  Tolerated the procedure well.    POD# 1: Vital signs were stable.  Patient denied Chest pain, shortness of breath, or calf pain.  Patient was started on Aspirin twice daily at 8am.  Consults to PT, OT, and care management were made.  The patient was weight bearing as tolerated.  CPM was placed on the operative leg 0-90 degrees for 6-8 hours a day. When out of the CPM, patient was placed in the foam block to achieve full extension. Incentive spirometry was taught.  Dressing was changed.  POD #2, Continued  PT for ambulation and exercise program.  IV saline locked.  O2 discontinued.    The remainder of the hospital course was dedicated to ambulation and strengthening.   The patient was discharged on 4 days post op in  Good condition.  Blood products given:none  DIAGNOSTIC STUDIES: Recent vital signs: Patient Vitals for the past 24 hrs:  BP Temp Temp src Pulse Resp SpO2  01/19/23 0835 -- -- -- -- -- 95 %  01/19/23 0533 131/74 98.7 F (37.1 C) Oral 80 16 96 %  01/18/23 2248 127/76 98.5 F (36.9 C) Oral 83 18 100 %  01/18/23 1925 -- -- -- -- -- 98 %  01/18/23 1832 132/67 98.4 F (36.9 C) Oral 89 18 98 %       Recent laboratory studies: No results for input(s): "WBC", "HGB", "HCT", "PLT" in the last 168 hours. No results for input(s): "NA", "K", "CL", "CO2", "BUN", "CREATININE",  "GLUCOSE", "CALCIUM" in the last 168 hours. Lab Results  Component Value Date   INR 1.1 07/08/2022     Recent Radiographic Studies :  No results found.  DISCHARGE INSTRUCTIONS: Discharge Instructions     Call MD / Call 911   Complete by: As directed    If you experience chest pain or shortness of breath, CALL 911 and be transported to the hospital emergency room.  If you develope a fever above 101 F, pus (white drainage) or increased drainage or redness at the wound, or calf pain, call your surgeon's office.   Constipation Prevention   Complete by: As directed    Drink plenty of fluids.  Prune juice may be helpful.  You may use a stool softener, such as Colace (over the counter) 100 mg twice a day.  Use MiraLax (over the counter) for constipation as needed.   Diet - low sodium heart healthy   Complete by: As directed    Discharge instructions   Complete by: As directed    INSTRUCTIONS AFTER JOINT REPLACEMENT   Remove items at home which could result in a fall. This includes throw rugs or furniture in walking pathways ICE to the affected joint every three hours while awake for 30 minutes at a time, for at least the first 3-5 days, and then as needed for pain and swelling.  Continue to use ice for pain and swelling. You may notice swelling that will progress down to the foot and ankle.  This is normal after surgery.  Elevate your leg when you are not up walking on it.   Continue to use the breathing machine you got in the hospital (incentive spirometer) which will help keep your temperature down.  It is common for your temperature to cycle up and down following surgery, especially at night when you are not up moving around and exerting yourself.  The breathing machine keeps your lungs expanded and your temperature down.   DIET:  As you were doing prior to hospitalization, we recommend a well-balanced diet.  DRESSING / WOUND CARE / SHOWERING  Keep the surgical dressing until follow up.   The dressing is water proof, so you can shower without any extra covering.  IF THE DRESSING FALLS OFF or the wound gets wet inside, change the dressing with sterile gauze.  Please use good hand washing techniques before changing the dressing.  Do not use any lotions or creams on the incision until instructed by your surgeon.    ACTIVITY  Increase activity slowly as tolerated,  but follow the weight bearing instructions below.   No driving for 6 weeks or until further direction given by your physician.  You cannot drive while taking narcotics.  No lifting or carrying greater than 10 lbs. until further directed by your surgeon. Avoid periods of inactivity such as sitting longer than an hour when not asleep. This helps prevent blood clots.  You may return to work once you are authorized by your doctor.     WEIGHT BEARING   Weight bearing as tolerated with assist device (walker, cane, etc) as directed, use it as long as suggested by your surgeon or therapist, typically at least 4-6 weeks.   EXERCISES  Results after joint replacement surgery are often greatly improved when you follow the exercise, range of motion and muscle strengthening exercises prescribed by your doctor. Safety measures are also important to protect the joint from further injury. Any time any of these exercises cause you to have increased pain or swelling, decrease what you are doing until you are comfortable again and then slowly increase them. If you have problems or questions, call your caregiver or physical therapist for advice.   Rehabilitation is important following a joint replacement. After just a few days of immobilization, the muscles of the leg can become weakened and shrink (atrophy).  These exercises are designed to build up the tone and strength of the thigh and leg muscles and to improve motion. Often times heat used for twenty to thirty minutes before working out will loosen up your tissues and help with improving  the range of motion but do not use heat for the first two weeks following surgery (sometimes heat can increase post-operative swelling).   These exercises can be done on a training (exercise) mat, on the floor, on a table or on a bed. Use whatever works the best and is most comfortable for you.    Use music or television while you are exercising so that the exercises are a pleasant break in your day. This will make your life better with the exercises acting as a break in your routine that you can look forward to.   Perform all exercises about fifteen times, three times per day or as directed.  You should exercise both the operative leg and the other leg as well.  Exercises include:   Quad Sets - Tighten up the muscle on the front of the thigh (Quad) and hold for 5-10 seconds.   Straight Leg Raises - With your knee straight (if you were given a brace, keep it on), lift the leg to 60 degrees, hold for 3 seconds, and slowly lower the leg.  Perform this exercise against resistance later as your leg gets stronger.  Leg Slides: Lying on your back, slowly slide your foot toward your buttocks, bending your knee up off the floor (only go as far as is comfortable). Then slowly slide your foot back down until your leg is flat on the floor again.  Angel Wings: Lying on your back spread your legs to the side as far apart as you can without causing discomfort.  Hamstring Strength:  Lying on your back, push your heel against the floor with your leg straight by tightening up the muscles of your buttocks.  Repeat, but this time bend your knee to a comfortable angle, and push your heel against the floor.  You may put a pillow under the heel to make it more comfortable if necessary.   A rehabilitation program following joint replacement  surgery can speed recovery and prevent re-injury in the future due to weakened muscles. Contact your doctor or a physical therapist for more information on knee rehabilitation.     CONSTIPATION  Constipation is defined medically as fewer than three stools per week and severe constipation as less than one stool per week.  Even if you have a regular bowel pattern at home, your normal regimen is likely to be disrupted due to multiple reasons following surgery.  Combination of anesthesia, postoperative narcotics, change in appetite and fluid intake all can affect your bowels.   YOU MUST use at least one of the following options; they are listed in order of increasing strength to get the job done.  They are all available over the counter, and you may need to use some, POSSIBLY even all of these options:    Drink plenty of fluids (prune juice may be helpful) and high fiber foods Colace 100 mg by mouth twice a day  Senokot for constipation as directed and as needed Dulcolax (bisacodyl), take with full glass of water  Miralax (polyethylene glycol) once or twice a day as needed.  If you have tried all these things and are unable to have a bowel movement in the first 3-4 days after surgery call either your surgeon or your primary doctor.    If you experience loose stools or diarrhea, hold the medications until you stool forms back up.  If your symptoms do not get better within 1 week or if they get worse, check with your doctor.  If you experience "the worst abdominal pain ever" or develop nausea or vomiting, please contact the office immediately for further recommendations for treatment.   ITCHING:  If you experience itching with your medications, try taking only a single pain pill, or even half a pain pill at a time.  You can also use Benadryl over the counter for itching or also to help with sleep.   TED HOSE STOCKINGS:  Use stockings on both legs until for at least 2 weeks or as directed by physician office. They may be removed at night for sleeping.  MEDICATIONS:  See your medication summary on the "After Visit Summary" that nursing will review with you.  You may have some  home medications which will be placed on hold until you complete the course of blood thinner medication.  It is important for you to complete the blood thinner medication as prescribed.  PRECAUTIONS:  If you experience chest pain or shortness of breath - call 911 immediately for transfer to the hospital emergency department.   If you develop a fever greater that 101 F, purulent drainage from wound, increased redness or drainage from wound, foul odor from the wound/dressing, or calf pain - CONTACT YOUR SURGEON.                                                   FOLLOW-UP APPOINTMENTS:  If you do not already have a post-op appointment, please call the office for an appointment to be seen by your surgeon.  Guidelines for how soon to be seen are listed in your "After Visit Summary", but are typically between 1-4 weeks after surgery.  OTHER INSTRUCTIONS:   Knee Replacement:  Do not place pillow under knee, focus on keeping the knee straight while resting. CPM instructions: 0-90 degrees, 2 hours  in the morning, 2 hours in the afternoon, and 2 hours in the evening. Place foam block, curve side up under heel at all times except when in CPM or when walking.  DO NOT modify, tear, cut, or change the foam block in any way.  POST-OPERATIVE OPIOID TAPER INSTRUCTIONS: It is important to wean off of your opioid medication as soon as possible. If you do not need pain medication after your surgery it is ok to stop day one. Opioids include: Codeine, Hydrocodone(Norco, Vicodin), Oxycodone(Percocet, oxycontin) and hydromorphone amongst others.  Long term and even short term use of opiods can cause: Increased pain response Dependence Constipation Depression Respiratory depression And more.  Withdrawal symptoms can include Flu like symptoms Nausea, vomiting And more Techniques to manage these symptoms Hydrate well Eat regular healthy meals Stay active Use relaxation techniques(deep breathing, meditating,  yoga) Do Not substitute Alcohol to help with tapering If you have been on opioids for less than two weeks and do not have pain than it is ok to stop all together.  Plan to wean off of opioids This plan should start within one week post op of your joint replacement. Maintain the same interval or time between taking each dose and first decrease the dose.  Cut the total daily intake of opioids by one tablet each day Next start to increase the time between doses. The last dose that should be eliminated is the evening dose.     MAKE SURE YOU:  Understand these instructions.  Get help right away if you are not doing well or get worse.    Thank you for letting us be a part of your medical care team.  It is a privilege we respect greatly.  We hope these instructions will help you stay on track for a fast and full recovery!   Increase activity slowly as tolerated   Complete by: As directed    Post-operative opioid taper instructions:   Complete by: As directed    POST-OPERATIVE OPIOID TAPER INSTRUCTIONS: It is important to wean off of your opioid medication as soon as possible. If you do not need pain medication after your surgery it is ok to stop day one. Opioids include: Codeine, Hydrocodone(Norco, Vicodin), Oxycodone(Percocet, oxycontin) and hydromorphone amongst others.  Long term and even short term use of opiods can cause: Increased pain response Dependence Constipation Depression Respiratory depression And more.  Withdrawal symptoms can include Flu like symptoms Nausea, vomiting And more Techniques to manage these symptoms Hydrate well Eat regular healthy meals Stay active Use relaxation techniques(deep breathing, meditating, yoga) Do Not substitute Alcohol to help with tapering If you have been on opioids for less than two weeks and do not have pain than it is ok to stop all together.  Plan to wean off of opioids This plan should start within one week post op of your joint  replacement. Maintain the same interval or time between taking each dose and first decrease the dose.  Cut the total daily intake of opioids by one tablet each day Next start to increase the time between doses. The last dose that should be eliminated is the evening dose.          DISCHARGE MEDICATIONS:   Allergies as of 01/19/2023       Reactions   Lisinopril Anaphylaxis   " Tongue swelling  with some SOB "    Reglan [metoclopramide] Hives   Stadol [butorphanol] Hives   Nasal spray    Amitriptyline Hcl Hives, Swelling  Cefazolin Hives, Swelling   Ceftriaxone Sodium Hives, Swelling   Ciprofloxacin Hives, Swelling   Darvocet [propoxyphene N-acetaminophen] Hives, Swelling   Penicillins Hives, Swelling   Prochlorperazine Edisylate Hives, Swelling   Promethazine Hcl    Jaw locks up   Propoxyphene Hives, Swelling   Sulfamethoxazole Swelling   Sulfonamide Derivatives Hives, Swelling   Sumatriptan Hives, Swelling        Medication List     STOP taking these medications    aspirin EC 81 MG tablet Replaced by: aspirin 81 MG chewable tablet   ibuprofen 200 MG tablet Commonly known as: ADVIL       TAKE these medications    albuterol 108 (90 Base) MCG/ACT inhaler Commonly known as: VENTOLIN HFA Inhale 2 puffs into the lungs every 4 (four) hours as needed.   aspirin 81 MG chewable tablet Chew 1 tablet (81 mg total) by mouth 2 (two) times daily. Replaces: aspirin EC 81 MG tablet   budesonide-formoterol 160-4.5 MCG/ACT inhaler Commonly known as: SYMBICORT Inhale 2 puffs into the lungs 2 (two) times daily.   calcium carbonate 600 MG tablet Commonly known as: OS-CAL Take 600 mg by mouth 2 (two) times daily with a meal.   cetirizine 10 MG tablet Commonly known as: ZYRTEC Take 10 mg by mouth daily.   citalopram 40 MG tablet Commonly known as: CELEXA Take 40 mg by mouth at bedtime.   cyclobenzaprine 5 MG tablet Commonly known as: FLEXERIL Take 5-10 mg by mouth  3 (three) times daily as needed for muscle spasms.   diclofenac Sodium 1 % Gel Commonly known as: VOLTAREN Apply 4 g topically 4 (four) times daily.   empagliflozin 25 MG Tabs tablet Commonly known as: JARDIANCE Take 25 mg by mouth daily.   EPINEPHrine 0.3 mg/0.3 mL Soaj injection Commonly known as: EPI-PEN Use as directed for life-threatening allergic reaction.   furosemide 20 MG tablet Commonly known as: LASIX Take 20 mg by mouth daily as needed for fluid or edema.   gabapentin 300 MG capsule Commonly known as: NEURONTIN Take 300 mg by mouth 2 (two) times daily.   hydrOXYzine 50 MG capsule Commonly known as: VISTARIL Take 50-100 mg by mouth every 6 (six) hours as needed for anxiety.   Melatonin 10 MG Tabs Take 60 mg by mouth at bedtime.   methocarbamol 500 MG tablet Commonly known as: ROBAXIN Take 1-2 tablets (500-1,000 mg total) by mouth every 6 (six) hours as needed for muscle spasms.   metoprolol succinate 50 MG 24 hr tablet Commonly known as: TOPROL-XL Take 1 tablet (50 mg total) by mouth daily. Take with or immediately following a meal.   mirabegron ER 50 MG Tb24 tablet Commonly known as: MYRBETRIQ Take 50 mg by mouth daily.   montelukast 10 MG tablet Commonly known as: SINGULAIR Take 10 mg by mouth at bedtime.   omeprazole 40 MG capsule Commonly known as: PRILOSEC Take 40 mg by mouth daily.   ondansetron 4 MG tablet Commonly known as: ZOFRAN Take 4-8 mg by mouth every 8 (eight) hours as needed for vomiting or nausea.   oxyCODONE 5 MG immediate release tablet Commonly known as: Oxy IR/ROXICODONE Take 1-2 tablets (5-10 mg total) by mouth every 6 (six) hours as needed for moderate pain (pain score 4-6).   rosuvastatin 40 MG tablet Commonly known as: CRESTOR Take 40 mg by mouth at bedtime.   temazepam 30 MG capsule Commonly known as: RESTORIL Take 1 capsule by mouth at bedtime as needed for sleep.  What changed: See the new instructions.    topiramate 50 MG tablet Commonly known as: TOPAMAX Take 50 mg by mouth at bedtime.               Durable Medical Equipment  (From admission, onward)           Start     Ordered   01/17/23 1454  DME Walker rolling  Once       Question:  Patient needs a walker to treat with the following condition  Answer:  S/P total knee replacement   01/17/23 1453   01/17/23 1454  DME 3 n 1  Once        01/17/23 1453   01/17/23 1454  DME Bedside commode  Once       Question:  Patient needs a bedside commode to treat with the following condition  Answer:  S/P total knee replacement   01/17/23 1453            FOLLOW UP VISIT:    DISPOSITION: HOME VS. SNF  Dental Antibiotics:  In most cases prophylactic antibiotics for Dental procdeures after total joint surgery are not necessary.  Exceptions are as follows:  1. History of prior total joint infection  2. Severely immunocompromised (Organ Transplant, cancer chemotherapy, Rheumatoid biologic meds such as Humera)  3. Poorly controlled diabetes (A1C &gt; 8.0, blood glucose over 200)  If you have one of these conditions, contact your surgeon for an antibiotic prescription, prior to your dental procedure.   CONDITION:  Good   Guy Sandifer 01/19/2023, 1:07 PM

## 2023-01-19 NOTE — Progress Notes (Signed)
Physical Therapy Treatment Patient Details Name: Veronica Leblanc MRN: [id number] DOB: September 25, 1960 Today's Date: 01/19/2023   History of Present Illness 62 y.o. female admitted 01/17/23 for L TKA. PMH: morbid obesity, OSA, CVA, DM.    PT Comments  Pt with slow progress- remains limited by pain and dizziness.  She did have a drop in BP with standing (see below) potentially contributing to dizziness.  Additionally, pt received Dilaudid prior to PT session.  She ambulated 25' and then 30' with slow speed, contact guard, and still needs chair follow due to dizziness.  Pt has long hallway to enter (likely at least 52' per pt and dtr), discussed having daughter bring folding chair for rest breaks.  Even if took rest breaks for hallway, pt not ambulating household distances and still with dizziness.  Recommend further therapy prior to return home.   Orthostatic BP: Sitting 134/66 Standing 126/71 Standing during walk when became dizzy 115/85 Return to sitting 128/68    If plan is discharge home, recommend the following: A little help with walking and/or transfers;A little help with bathing/dressing/bathroom;Assistance with cooking/housework;Assist for transportation;Help with stairs or ramp for entrance   Can travel by private vehicle        Equipment Recommendations  None recommended by PT    Recommendations for Other Services       Precautions / Restrictions Precautions Precautions: Fall;Knee Precaution Comments: pt reports h/o several falls in past 6 months 2* L knee buckling Restrictions Other Position/Activity Restrictions: WBAT     Mobility  Bed Mobility Overal bed mobility: Needs Assistance Bed Mobility: Supine to Sit     Supine to sit: Min assist     General bed mobility comments: in chair    Transfers Overall transfer level: Needs assistance Equipment used: Rolling walker (2 wheels) Transfers: Sit to/from Stand Sit to Stand: Contact guard assist            General transfer comment: Cues for hand placement; performed x 2 with increased time    Ambulation/Gait Ambulation/Gait assistance: Contact guard assist Gait Distance (Feet): 25 Feet (25' then 30') Assistive device: Rolling walker (2 wheels) Gait Pattern/deviations: Step-to pattern, Decreased stance time - left, Decreased weight shift to left Gait velocity: decreased significantly     General Gait Details: Slow gait speed with chair follow. Pt did not have any buckling today but was limited by dizziness and fatigue (She did have orthostatic BP, see below)   Stairs             Wheelchair Mobility     Tilt Bed    Modified Rankin (Stroke Patients Only)       Balance Overall balance assessment: Needs assistance Sitting-balance support: Feet supported, No upper extremity supported Sitting balance-Leahy Scale: Good     Standing balance support: Bilateral upper extremity supported, During functional activity, Reliant on assistive device for balance Standing balance-Leahy Scale: Poor                              Cognition Arousal: Alert Behavior During Therapy: WFL for tasks assessed/performed Overall Cognitive Status: Within Functional Limits for tasks assessed                                          Exercises Total Joint Exercises Ankle Circles/Pumps: AROM, Both, 10 reps, Supine Quad Sets: AROM,  Both, 10 reps, Supine Heel Slides: AAROM, Left, 5 reps, Supine Hip ABduction/ADduction: AAROM, Left, 5 reps, Supine Long Arc Quad: AAROM, Left, Seated, 10 reps Knee Flexion: AAROM, Left, Seated, 10 reps Goniometric ROM: L knee ~5 to 50 degrees    General Comments General comments (skin integrity, edema, etc.): Pt with c/o dizziness with ambulation.  Described as mild spinning sensation but not lightheaded.  Unable to obtain BP until sitting.  Did obtain BP upon sitting and BP was 118/61 with O2 sats 96%.   Pt with long hallway to enter  apartment -did discuss could set up a kitchen chair for rest breaks, pt does report having folding chairs for bridge table that she could uise      Pertinent Vitals/Pain Pain Assessment Pain Assessment: 0-10 Pain Score: 7  Pain Location: L knee Pain Descriptors / Indicators: Sore Pain Intervention(s): Limited activity within patient's tolerance, Monitored during session, Premedicated before session, Repositioned, Ice applied (had Dilaudid prior)    Home Living                          Prior Function            PT Goals (current goals can now be found in the care plan section) Progress towards PT goals: Progressing toward goals    Frequency    7X/week      PT Plan Current plan remains appropriate    Co-evaluation              AM-PAC PT "6 Clicks" Mobility   Outcome Measure  Help needed turning from your back to your side while in a flat bed without using bedrails?: A Little Help needed moving from lying on your back to sitting on the side of a flat bed without using bedrails?: A Little Help needed moving to and from a bed to a chair (including a wheelchair)?: A Little Help needed standing up from a chair using your arms (e.g., wheelchair or bedside chair)?: A Little Help needed to walk in hospital room?: A Little Help needed climbing 3-5 steps with a railing? : A Lot 6 Click Score: 17    End of Session Equipment Utilized During Treatment: Gait belt Activity Tolerance: Patient limited by pain (and dizziness) Patient left: in chair;with chair alarm set;with call bell/phone within reach;with family/visitor present Nurse Communication: Mobility status (orthostatic BP, did not clear PT) PT Visit Diagnosis: Muscle weakness (generalized) (M62.81);History of falling (Z91.81);Other abnormalities of gait and mobility (R26.89);Pain Pain - Right/Left: Left Pain - part of body: Knee     Time: 1610-9604 PT Time Calculation (min) (ACUTE ONLY): 39  min  Charges:    $Gait Training: 8-22 mins $Therapeutic Exercise: 8-22 mins $Therapeutic Activity: 8-22 mins PT General Charges $$ ACUTE PT VISIT: 1 Visit                     Anise Salvo, PT Acute Rehab [facility] [phone]    Rayetta Humphrey 01/19/2023, 2:42 PM

## 2023-01-19 NOTE — Progress Notes (Signed)
Physical Therapy Treatment Patient Details Name: Veronica Leblanc MRN: [id number] DOB: December 03, 1960 Today's Date: 01/19/2023   History of Present Illness 62 y.o. female admitted 01/17/23 for L TKA. PMH: morbid obesity, OSA, CVA, DM.    PT Comments  Pt was unable to ambulate as far this morning (did 22'x2)-limited by dizziness. Unable to obtain BP in standing but pt describing symptoms of spinning and not lightheaded.  Will obtain BP in standing this afternoon, although symptoms not appearing consistent with orthostatic BP.  Continue to progress as able.    If plan is discharge home, recommend the following: A little help with walking and/or transfers;A little help with bathing/dressing/bathroom;Assistance with cooking/housework;Assist for transportation;Help with stairs or ramp for entrance   Can travel by private vehicle        Equipment Recommendations  None recommended by PT    Recommendations for Other Services       Precautions / Restrictions Precautions Precautions: Fall;Knee Precaution Comments: pt reports h/o several falls in past 6 months 2* L knee buckling Restrictions Other Position/Activity Restrictions: WBAT     Mobility  Bed Mobility Overal bed mobility: Needs Assistance Bed Mobility: Supine to Sit     Supine to sit: Min assist     General bed mobility comments: assist for LLE to EOB and increased time    Transfers Overall transfer level: Needs assistance Equipment used: Rolling walker (2 wheels) Transfers: Sit to/from Stand Sit to Stand: Contact guard assist           General transfer comment: Cues for hand placement; performed x 3 with increased time    Ambulation/Gait Ambulation/Gait assistance: Contact guard assist Gait Distance (Feet): 22 Feet (22'x2) Assistive device: Rolling walker (2 wheels) Gait Pattern/deviations: Step-to pattern, Decreased stance time - left, Decreased weight shift to left Gait velocity: decreased significantly      General Gait Details: Slow gait speed with chair follow.  Pt experiencing dizziness that limited distance (see general comments). Ambulated 22' x 2 with chair follow.   Stairs             Wheelchair Mobility     Tilt Bed    Modified Rankin (Stroke Patients Only)       Balance Overall balance assessment: Needs assistance Sitting-balance support: Feet supported, No upper extremity supported Sitting balance-Leahy Scale: Good     Standing balance support: Bilateral upper extremity supported, During functional activity, Reliant on assistive device for balance Standing balance-Leahy Scale: Poor                              Cognition Arousal: Alert Behavior During Therapy: WFL for tasks assessed/performed Overall Cognitive Status: Within Functional Limits for tasks assessed                                          Exercises Total Joint Exercises Ankle Circles/Pumps: AROM, Both, 10 reps, Supine Quad Sets: AROM, Both, 10 reps, Supine Long Arc Quad: AAROM, Left, Seated, 10 reps Knee Flexion: AAROM, Left, Seated, 10 reps    General Comments General comments (skin integrity, edema, etc.):   Pt with c/o dizziness with ambulation.  Described as mild spinning sensation but not lightheaded.  Unable to obtain BP until sitting.  Did obtain BP upon sitting and BP was 118/61 with O2 sats 96%.     Pt  with long hallway to enter apartment -did discuss could set up a kitchen chair for rest breaks, pt does report having folding chairs for bridge table that she could uise      Pertinent Vitals/Pain Pain Assessment Pain Assessment: 0-10 Pain Score: 6  Pain Location: L knee Pain Descriptors / Indicators: Sore Pain Intervention(s): Limited activity within patient's tolerance, Monitored during session, Premedicated before session, Repositioned, Ice applied    Home Living                          Prior Function            PT Goals  (current goals can now be found in the care plan section) Progress towards PT goals: Progressing toward goals    Frequency    7X/week      PT Plan Current plan remains appropriate    Co-evaluation              AM-PAC PT "6 Clicks" Mobility   Outcome Measure  Help needed turning from your back to your side while in a flat bed without using bedrails?: A Little Help needed moving from lying on your back to sitting on the side of a flat bed without using bedrails?: A Little Help needed moving to and from a bed to a chair (including a wheelchair)?: A Little Help needed standing up from a chair using your arms (e.g., wheelchair or bedside chair)?: A Little Help needed to walk in hospital room?: A Little Help needed climbing 3-5 steps with a railing? : A Lot 6 Click Score: 17    End of Session Equipment Utilized During Treatment: Gait belt Activity Tolerance: Patient tolerated treatment well Patient left: in chair;with chair alarm set;with call bell/phone within reach;with family/visitor present Nurse Communication: Mobility status PT Visit Diagnosis: Muscle weakness (generalized) (M62.81);History of falling (Z91.81);Other abnormalities of gait and mobility (R26.89);Pain Pain - Right/Left: Left     Time: 6295-2841 PT Time Calculation (min) (ACUTE ONLY): 27 min  Charges:    $Gait Training: 8-22 mins $Therapeutic Exercise: 8-22 mins PT General Charges $$ ACUTE PT VISIT: 1 Visit                     Anise Salvo, PT Acute Rehab Services [facility] [phone]    Rayetta Humphrey 01/19/2023, 12:03 PM

## 2023-01-19 NOTE — Progress Notes (Signed)
SPORTS MEDICINE AND JOINT REPLACEMENT  Georgena Spurling, MD    Laurier Nancy, PA-C [address] [place], [place], [place]  [address]                             [phone]   PROGRESS NOTE  Subjective:  negative for Chest Pain  negative for Shortness of Breath  negative for Nausea/Vomiting   negative for Calf Pain  negative for Bowel Movement   Tolerating Diet: yes         Patient reports pain as 3 on 0-10 scale.    Objective: Vital signs in last 24 hours:   Patient Vitals for the past 24 hrs:  BP Temp Temp src Pulse Resp SpO2  01/19/23 0835 -- -- -- -- -- 95 %  01/19/23 0533 131/74 98.7 F (37.1 C) Oral 80 16 96 %  01/18/23 2248 127/76 98.5 F (36.9 C) Oral 83 18 100 %  01/18/23 1925 -- -- -- -- -- 98 %  01/18/23 1832 132/67 98.4 F (36.9 C) Oral 89 18 98 %    @flow {1959:LAST@   Intake/Output from previous day:   08/06 0701 - 08/07 0700 In: 920.8 [P.O.:450; I.V.:470.8] Out: 500 [Urine:500]   Intake/Output this shift:   08/07 0701 - 08/07 1900 In: 480 [P.O.:480] Out: -    Intake/Output      08/06 0701 08/07 0700 08/07 0701 08/08 0700   P.O. 450 480   I.V. (mL/kg) 470.8 (4.3)    IV Piggyback 0    Total Intake(mL/kg) 920.8 (8.4) 480 (4.4)   Urine (mL/kg/hr) 500 (0.2)    Blood     Total Output 500    Net +420.8 +480        Urine Occurrence 1 x    Stool Occurrence 0 x       LABORATORY DATA: No results for input(s): "WBC", "HGB", "HCT", "PLT" in the last 168 hours. No results for input(s): "NA", "K", "CL", "CO2", "BUN", "CREATININE", "GLUCOSE", "CALCIUM" in the last 168 hours. Lab Results  Component Value Date   INR 1.1 07/08/2022    Examination:  General appearance: alert, cooperative, and no distress Extremities: extremities normal, atraumatic, no cyanosis or edema  Wound Exam: clean, dry, intact   Drainage:  None: wound tissue dry  Motor Exam: Quadriceps and Hamstrings Intact  Sensory Exam: Superficial Peroneal, Deep Peroneal, and Tibial  normal   Assessment:    2 Days Post-Op  Procedure(s) (LRB): TOTAL KNEE ARTHROPLASTY (Left)  ADDITIONAL DIAGNOSIS:  Principal Problem:   S/P total knee replacement     Plan: Physical Therapy as ordered Weight Bearing as Tolerated (WBAT)  DVT Prophylaxis:  Aspirin  DISCHARGE PLAN: Home  Patient much improved today and will be ready for D/C home after PT this afternoon. Orders in and RXs sent in  Guy Sandifer 01/19/2023, 1:03 PM

## 2023-01-19 NOTE — Progress Notes (Signed)
   01/19/23 2300  BiPAP/CPAP/SIPAP  BiPAP/CPAP/SIPAP Pt Type Adult  BiPAP/CPAP/SIPAP DREAMSTATIOND  Mask Type Full face mask (home mask)  Mask Size Medium  Respiratory Rate 18 breaths/min  FiO2 (%) 21 %  Patient Home Equipment No  Auto Titrate Yes  Press High Alarm  (5-20)

## 2023-01-19 NOTE — Plan of Care (Signed)
°  Problem: Pain Management: Goal: Pain level will decrease with appropriate interventions Outcome: Progressing   Problem: Clinical Measurements: Goal: Ability to maintain clinical measurements within normal limits will improve Outcome: Progressing   Problem: Activity: Goal: Risk for activity intolerance will decrease Outcome: Progressing   Problem: Safety: Goal: Ability to remain free from injury will improve Outcome: Progressing

## 2023-01-20 DIAGNOSIS — E119 Type 2 diabetes mellitus without complications: Secondary | ICD-10-CM | POA: Diagnosis not present

## 2023-01-20 DIAGNOSIS — Z7982 Long term (current) use of aspirin: Secondary | ICD-10-CM | POA: Diagnosis not present

## 2023-01-20 DIAGNOSIS — Z79899 Other long term (current) drug therapy: Secondary | ICD-10-CM | POA: Diagnosis not present

## 2023-01-20 DIAGNOSIS — I10 Essential (primary) hypertension: Secondary | ICD-10-CM | POA: Diagnosis not present

## 2023-01-20 DIAGNOSIS — J45909 Unspecified asthma, uncomplicated: Secondary | ICD-10-CM | POA: Diagnosis not present

## 2023-01-20 DIAGNOSIS — Z8673 Personal history of transient ischemic attack (TIA), and cerebral infarction without residual deficits: Secondary | ICD-10-CM | POA: Diagnosis not present

## 2023-01-20 DIAGNOSIS — M1712 Unilateral primary osteoarthritis, left knee: Secondary | ICD-10-CM | POA: Diagnosis not present

## 2023-01-20 NOTE — Progress Notes (Signed)
Physical Therapy Treatment Patient Details Name: Veronica Leblanc MRN: [id number] DOB: 1960/10/29 Today's Date: 01/20/2023   History of Present Illness 62 y.o. female admitted 01/17/23 for L TKA. PMH: morbid obesity, OSA, CVA, DM.    PT Comments  Pt required mod assist for supine to sit. She ambulated 37' with RW, distance limited by dizziness, pt was assisted to recliner where BP was 122/67. Pt performed TKA HEP with min assist. Pt is progressing slowing with mobility. She is not mobilizing well enough to DC home at this time. She reports she will be home alone when her daughter is at work. Encouraged pt to ask friends/relatives/neighbors to be with her so that she has 24 hr assistance initially at home.     If plan is discharge home, recommend the following: A little help with walking and/or transfers;A little help with bathing/dressing/bathroom;Assistance with cooking/housework;Assist for transportation;Help with stairs or ramp for entrance   Can travel by private vehicle        Equipment Recommendations  None recommended by PT    Recommendations for Other Services       Precautions / Restrictions Precautions Precautions: Fall;Knee Precaution Booklet Issued: Yes (comment) Precaution Comments: pt reports h/o several falls in past 6 months 2* L knee buckling Restrictions Other Position/Activity Restrictions: WBAT     Mobility  Bed Mobility Overal bed mobility: Needs Assistance Bed Mobility: Supine to Sit     Supine to sit: Mod assist     General bed mobility comments: mod A to raise trunk    Transfers Overall transfer level: Needs assistance Equipment used: Rolling walker (2 wheels) Transfers: Sit to/from Stand Sit to Stand: Contact guard assist           General transfer comment: VCs hand placement, increased time    Ambulation/Gait Ambulation/Gait assistance: Contact guard assist Gait Distance (Feet): 30 Feet Assistive device: Rolling walker (2  wheels) Gait Pattern/deviations: Step-to pattern, Decreased stance time - left, Decreased weight shift to left Gait velocity: decreased significantly     General Gait Details: Slow gait speed with chair follow. Pt did not have any buckling today but was limited by dizziness, pain and fatigue. BP 122/67 sitting in recliner after walking, HR 90, SpO2 97%.   Stairs             Wheelchair Mobility     Tilt Bed    Modified Rankin (Stroke Patients Only)       Balance Overall balance assessment: Needs assistance Sitting-balance support: Feet supported, No upper extremity supported Sitting balance-Leahy Scale: Good     Standing balance support: Bilateral upper extremity supported, During functional activity, Reliant on assistive device for balance Standing balance-Leahy Scale: Poor                              Cognition Arousal: Lethargic, Suspect due to medications Behavior During Therapy: WFL for tasks assessed/performed Overall Cognitive Status: Within Functional Limits for tasks assessed                                          Exercises Total Joint Exercises Ankle Circles/Pumps: AROM, Both, 10 reps, Supine Quad Sets: AROM, Both, Supine, 5 reps Short Arc Quad: AAROM, Left, 5 reps, Supine Heel Slides: AAROM, Left, 5 reps, Supine Hip ABduction/ADduction: AAROM, Left, 5 reps, Supine Long Arc Quad: AAROM,  Left, Seated, 5 reps Knee Flexion: AAROM, Left, Seated, 10 reps Knee Flexion Limitations: 5-55* AAROM L knee    General Comments        Pertinent Vitals/Pain Pain Assessment Pain Score: 9  Pain Location: L knee Pain Descriptors / Indicators: Sore Pain Intervention(s): Limited activity within patient's tolerance, Monitored during session, Premedicated before session, Ice applied    Home Living                          Prior Function            PT Goals (current goals can now be found in the care plan section)  Acute Rehab PT Goals Patient Stated Goal: to walk PT Goal Formulation: With patient Time For Goal Achievement: 01/24/23 Potential to Achieve Goals: Good Progress towards PT goals: Progressing toward goals    Frequency    7X/week      PT Plan Current plan remains appropriate    Co-evaluation              AM-PAC PT "6 Clicks" Mobility   Outcome Measure  Help needed turning from your back to your side while in a flat bed without using bedrails?: A Little Help needed moving from lying on your back to sitting on the side of a flat bed without using bedrails?: A Lot Help needed moving to and from a bed to a chair (including a wheelchair)?: A Little Help needed standing up from a chair using your arms (e.g., wheelchair or bedside chair)?: A Little Help needed to walk in hospital room?: A Little Help needed climbing 3-5 steps with a railing? : A Lot 6 Click Score: 16    End of Session Equipment Utilized During Treatment: Gait belt Activity Tolerance: Patient limited by pain;Treatment limited secondary to medical complications (Comment) (dizziness with walking) Patient left: in chair;with chair alarm set;with call bell/phone within reach Nurse Communication: Mobility status PT Visit Diagnosis: Muscle weakness (generalized) (M62.81);History of falling (Z91.81);Other abnormalities of gait and mobility (R26.89);Pain Pain - Right/Left: Left Pain - part of body: Knee     Time: 0931-0959 PT Time Calculation (min) (ACUTE ONLY): 28 min  Charges:    $Gait Training: 8-22 mins $Therapeutic Exercise: 8-22 mins PT General Charges $$ ACUTE PT VISIT: 1 Visit                     Presley, Thoms PT 01/20/2023  Acute Rehabilitation Services  Office [phone]

## 2023-01-20 NOTE — Progress Notes (Signed)
   01/20/23 2047  BiPAP/CPAP/SIPAP  BiPAP/CPAP/SIPAP Pt Type Adult (prefers self placement)  BiPAP/CPAP/SIPAP DREAMSTATIOND  Mask Type Full face mask  Mask Size Medium  FiO2 (%) 21 %  Patient Home Equipment No  Auto Titrate Yes (5-20)

## 2023-01-20 NOTE — Progress Notes (Signed)
Spoke to patient on the phone, unable to get ride for D/C today due to weather. Plan on D/C via daughter tomorrow morning.

## 2023-01-20 NOTE — Progress Notes (Addendum)
Physical Therapy Treatment Patient Details Name: Veronica Leblanc MRN: [id number] DOB: [birth year] Today's Date: 01/20/2023   History of Present Illness 62 y.o. female admitted 01/17/23 for L TKA. PMH: morbid obesity, OSA, CVA, DM.    PT Comments  Pt continues to be limited by dizziness when ambulating. She ambulated 58' with RW. BP sitting 137/62, standing 114/56. Pt unable to stand for 3 minutes 2* dizziness. Reviewed HEP with pt. She stated she will be alone at home when her daughter is working. Again, I encouraged pt to have someone with her 24/7 for the first few days at home, she stated she hasn't thought about this. It was discussed in this morning's session as well, pt does not seem to recall this discussion.     If plan is discharge home, recommend the following: A little help with walking and/or transfers;A little help with bathing/dressing/bathroom;Assistance with cooking/housework;Assist for transportation;Help with stairs or ramp for entrance   Can travel by private vehicle        Equipment Recommendations  None recommended by PT    Recommendations for Other Services       Precautions / Restrictions Precautions Precautions: Fall;Knee Precaution Booklet Issued: Yes (comment) Precaution Comments: pt reports h/o several falls in past 6 months 2* L knee buckling Restrictions Weight Bearing Restrictions: No Other Position/Activity Restrictions: WBAT     Mobility  Bed Mobility Overal bed mobility: Needs Assistance Bed Mobility: Supine to Sit     Supine to sit: Mod assist     General bed mobility comments: mod A to raise trunk    Transfers Overall transfer level: Needs assistance Equipment used: Rolling walker (2 wheels) Transfers: Sit to/from Stand Sit to Stand: Contact guard assist, From elevated surface           General transfer comment: VCs hand placement, increased time    Ambulation/Gait Ambulation/Gait assistance: Contact guard assist Gait  Distance (Feet): 35 Feet Assistive device: Rolling walker (2 wheels) Gait Pattern/deviations: Step-to pattern, Decreased stance time - left, Decreased weight shift to left Gait velocity: decreased significantly     General Gait Details: Slow gait speed with chair follow. Pt did not have any buckling today but was limited by dizziness, pain and fatigue. BP sitting 137/62, standing 114/56, unable to stand for 3 minutes 2* dizziness.   Stairs             Wheelchair Mobility     Tilt Bed    Modified Rankin (Stroke Patients Only)       Balance Overall balance assessment: Needs assistance Sitting-balance support: Feet supported, No upper extremity supported Sitting balance-Leahy Scale: Good     Standing balance support: Bilateral upper extremity supported, During functional activity, Reliant on assistive device for balance Standing balance-Leahy Scale: Poor                              Cognition Arousal: Alert Behavior During Therapy: WFL for tasks assessed/performed, Flat affect Overall Cognitive Status: Within Functional Limits for tasks assessed                                          Exercises Total Joint Exercises Ankle Circles/Pumps: AROM, Both, 10 reps, Supine Quad Sets: AROM, Both, Supine, 5 reps Short Arc Quad: AAROM, Left, Supine, 10 reps Heel Slides: AAROM, Left, Supine, 10 reps Hip  ABduction/ADduction: AAROM, Left, Supine, 10 reps Long Arc Quad: AAROM, Left, Seated, 5 reps Knee Flexion: AAROM, Left, Seated, 10 reps Knee Flexion Limitations: 5-55* AAROM L knee    General Comments        Pertinent Vitals/Pain Pain Assessment Pain Score: 8  Pain Location: L knee Pain Descriptors / Indicators: Sore Pain Intervention(s): Limited activity within patient's tolerance, Monitored during session, Premedicated before session, Ice applied    Home Living                          Prior Function            PT  Goals (current goals can now be found in the care plan section) Acute Rehab PT Goals Patient Stated Goal: to walk PT Goal Formulation: With patient Time For Goal Achievement: 01/24/23 Potential to Achieve Goals: Good Progress towards PT goals: Progressing toward goals    Frequency    7X/week      PT Plan Current plan remains appropriate    Co-evaluation              AM-PAC PT "6 Clicks" Mobility   Outcome Measure  Help needed turning from your back to your side while in a flat bed without using bedrails?: A Little Help needed moving from lying on your back to sitting on the side of a flat bed without using bedrails?: A Lot Help needed moving to and from a bed to a chair (including a wheelchair)?: A Little Help needed standing up from a chair using your arms (e.g., wheelchair or bedside chair)?: A Little Help needed to walk in hospital room?: A Little Help needed climbing 3-5 steps with a railing? : A Lot 6 Click Score: 16    End of Session Equipment Utilized During Treatment: Gait belt Activity Tolerance: Patient limited by pain;Treatment limited secondary to medical complications (Comment) (dizziness with walking) Patient left: in chair;with chair alarm set;with call bell/phone within reach Nurse Communication: Mobility status PT Visit Diagnosis: Muscle weakness (generalized) (M62.81);History of falling (Z91.81);Other abnormalities of gait and mobility (R26.89);Pain Pain - Right/Left: Left Pain - part of body: Knee     Time: 1331-1405 PT Time Calculation (min) (ACUTE ONLY): 34 min  Charges:    $Gait Training: 8-22 mins $Therapeutic Exercise: 8-22 mins PT General Charges $$ ACUTE PT VISIT: 1 Visit                     [place], [place] PT 01/20/2023  Acute Rehabilitation Services  Office [phone]

## 2023-01-21 ENCOUNTER — Other Ambulatory Visit: Payer: Self-pay | Admitting: Orthopedic Surgery

## 2023-01-21 DIAGNOSIS — M1712 Unilateral primary osteoarthritis, left knee: Secondary | ICD-10-CM | POA: Diagnosis not present

## 2023-01-21 DIAGNOSIS — J45909 Unspecified asthma, uncomplicated: Secondary | ICD-10-CM | POA: Diagnosis not present

## 2023-01-21 DIAGNOSIS — Z79899 Other long term (current) drug therapy: Secondary | ICD-10-CM | POA: Diagnosis not present

## 2023-01-21 DIAGNOSIS — Z96652 Presence of left artificial knee joint: Secondary | ICD-10-CM | POA: Diagnosis not present

## 2023-01-21 DIAGNOSIS — Z8673 Personal history of transient ischemic attack (TIA), and cerebral infarction without residual deficits: Secondary | ICD-10-CM | POA: Diagnosis not present

## 2023-01-21 DIAGNOSIS — E119 Type 2 diabetes mellitus without complications: Secondary | ICD-10-CM | POA: Diagnosis not present

## 2023-01-21 DIAGNOSIS — Z7982 Long term (current) use of aspirin: Secondary | ICD-10-CM | POA: Diagnosis not present

## 2023-01-21 DIAGNOSIS — I10 Essential (primary) hypertension: Secondary | ICD-10-CM | POA: Diagnosis not present

## 2023-01-21 NOTE — Progress Notes (Addendum)
Physical Therapy Treatment Patient Details Name: Veronica Leblanc MRN: [id number] DOB: 1960/12/14 Today's Date: 01/21/2023   History of Present Illness 62 y.o. female admitted 01/17/23 for L TKA. PMH: morbid obesity, OSA, CVA, DM.    PT Comments  Pt making good progress.  She is dressed and ready for discharge.  Reports she has neighbors and friends to assist when daughter at work.  Pt demonstrating understanding on HEP and transfer techniques. Pt and daughter with no further questions.  Pt demonstrates safe gait & transfers in order to return home from PT perspective once discharged by MD.  While in hospital, will continue to benefit from PT for skilled therapy to advance mobility and exercises.       If plan is discharge home, recommend the following: A little help with walking and/or transfers;A little help with bathing/dressing/bathroom;Assistance with cooking/housework;Assist for transportation;Help with stairs or ramp for entrance   Can travel by private vehicle        Equipment Recommendations  None recommended by PT    Recommendations for Other Services       Precautions / Restrictions Precautions Precautions: Fall;Knee Precaution Comments: pt reports h/o several falls in past 6 months 2* L knee buckling Restrictions Other Position/Activity Restrictions: WBAT     Mobility  Bed Mobility Overal bed mobility: Needs Assistance Bed Mobility: Supine to Sit     Supine to sit: Contact guard, Used rails     General bed mobility comments: in chair    Transfers Overall transfer level: Needs assistance Equipment used: Rolling walker (2 wheels) Transfers: Sit to/from Stand Sit to Stand: Contact guard assist               Ambulation/Gait  Stairs             Wheelchair Mobility     Tilt Bed    Modified Rankin (Stroke Patients Only)       Balance Overall balance assessment: Needs assistance Sitting-balance support: Feet supported, No upper extremity  supported Sitting balance-Leahy Scale: Good     Standing balance support: Bilateral upper extremity supported, During functional activity, Reliant on assistive device for balance Standing balance-Leahy Scale: Poor Standing balance comment: RW and min G                            Cognition Arousal: Alert Behavior During Therapy: WFL for tasks assessed/performed Overall Cognitive Status: Within Functional Limits for tasks assessed                                          Exercises Total Joint Exercises Ankle Circles/Pumps: AROM, Both, 10 reps, Supine Quad Sets: AROM, Both, Supine, 10 reps Long Arc Quad: AAROM, Left, Seated, 10 reps Knee Flexion: AAROM, Left, Seated, 10 reps (light assist) Goniometric ROM: L knee 5 to 55 degrees    General Comments General comments (skin integrity, edema, etc.):  Pt dressed and ready for discharge.  Educated on [employer], dressing techniques,  car transfers.  Educated on chair for rest breaks walking down hallway.  Educated on safe ice use, no pivots, car transfers, resting with leg straight, and TED hose during day. Also, encouraged walking every 1-2 hours during day. Educated on HEP with focus on mobility the first weeks. Discussed doing exercises within pain control and if pain increasing could decreased ROM, reps, and  stop exercises as needed. Encouraged to perform quad sets and ankle pumps frequently for blood flow and to promote full knee extension.       Pertinent Vitals/Pain Pain Assessment Pain Assessment: 0-10 Pain Score: 4  Pain Location: L knee Pain Descriptors / Indicators: Sore, Burning Pain Intervention(s): Limited activity within patient's tolerance, Monitored during session, Premedicated before session    Home Living                          Prior Function            PT Goals (current goals can now be found in the care plan section) Progress towards PT goals: Progressing toward  goals    Frequency    7X/week      PT Plan Current plan remains appropriate    Co-evaluation              AM-PAC PT "6 Clicks" Mobility   Outcome Measure  Help needed turning from your back to your side while in a flat bed without using bedrails?: A Little Help needed moving from lying on your back to sitting on the side of a flat bed without using bedrails?: A Little Help needed moving to and from a bed to a chair (including a wheelchair)?: A Little Help needed standing up from a chair using your arms (e.g., wheelchair or bedside chair)?: A Little Help needed to walk in hospital room?: A Little Help needed climbing 3-5 steps with a railing? : A Lot 6 Click Score: 17    End of Session Equipment Utilized During Treatment: Gait belt   Patient left: in chair;with chair alarm set;with call bell/phone within reach Nurse Communication: Mobility status PT Visit Diagnosis: Muscle weakness (generalized) (M62.81);History of falling (Z91.81);Other abnormalities of gait and mobility (R26.89);Pain Pain - Right/Left: Left Pain - part of body: Knee     Time: 1610-9604 PT Time Calculation (min) (ACUTE ONLY): 9 min  Charges:    $Therapeutic Activity: 8-22 mins PT General Charges $$ ACUTE PT VISIT: 1 Visit                     Anise Salvo, PT Acute Rehab [facility] [phone]    Veronica Leblanc 01/21/2023, 4:10 PM

## 2023-01-21 NOTE — Progress Notes (Signed)
Physical Therapy Treatment Patient Details Name: Veronica Leblanc MRN: [id number] DOB: [birth year] Today's Date: 01/21/2023   History of Present Illness 62 y.o. female admitted 01/17/23 for L TKA. PMH: morbid obesity, OSA, CVA, DM.    PT Comments  Pt making good progress today.  She did have a drop in BP with standing but improved with walking and asymptomatic.  She had improved pain and was able to ambulate increased distance of 50'x2.  Reiterated need for near 24 hour support initially at home.      If plan is discharge home, recommend the following: A little help with walking and/or transfers;A little help with bathing/dressing/bathroom;Assistance with cooking/housework;Assist for transportation;Help with stairs or ramp for entrance   Can travel by private vehicle        Equipment Recommendations  None recommended by PT    Recommendations for Other Services       Precautions / Restrictions Precautions Precautions: Fall;Knee Precaution Booklet Issued: Yes (comment) Precaution Comments: pt reports h/o several falls in past 6 months 2* L knee buckling Restrictions Weight Bearing Restrictions: No Other Position/Activity Restrictions: WBAT     Mobility  Bed Mobility Overal bed mobility: Needs Assistance Bed Mobility: Supine to Sit     Supine to sit: Contact guard, Used rails          Transfers Overall transfer level: Needs assistance Equipment used: Rolling walker (2 wheels) Transfers: Sit to/from Stand Sit to Stand: Contact guard assist           General transfer comment: Performed x 2; increased time    Ambulation/Gait Ambulation/Gait assistance: Contact guard assist Gait Distance (Feet): 50 Feet (50'x2) Assistive device: Rolling walker (2 wheels) Gait Pattern/deviations: Step-to pattern, Decreased stance time - left, Decreased weight shift to left Gait velocity: decreased     General Gait Details: Pt reports pain much better, she demonstrated improved  speed and stability.  Did still have chair follow for rest breaks but reports dizziness improved.  BP was 142/78 sitting and down to 119/70 standing but asymptomatic and 136/72 walking.   Stairs             Wheelchair Mobility     Tilt Bed    Modified Rankin (Stroke Patients Only)       Balance Overall balance assessment: Needs assistance Sitting-balance support: Feet supported, No upper extremity supported Sitting balance-Leahy Scale: Good     Standing balance support: Bilateral upper extremity supported, During functional activity, Reliant on assistive device for balance Standing balance-Leahy Scale: Poor Standing balance comment: RW and min G                            Cognition Arousal: Alert Behavior During Therapy: WFL for tasks assessed/performed, Flat affect Overall Cognitive Status: Within Functional Limits for tasks assessed                                          Exercises Total Joint Exercises Ankle Circles/Pumps: AROM, Both, 10 reps, Supine Quad Sets: AROM, Both, Supine, 10 reps Long Arc Quad: AAROM, Left, Seated, 10 reps Knee Flexion: AAROM, Left, Seated, 10 reps (light assist) Goniometric ROM: L knee 5 to 55 degrees    General Comments        Pertinent Vitals/Pain Pain Assessment Pain Assessment: 0-10 Pain Score: 4  Pain Location: L knee Pain  Descriptors / Indicators: Sore, Burning Pain Intervention(s): Limited activity within patient's tolerance, Monitored during session, Premedicated before session, Ice applied    Home Living                          Prior Function            PT Goals (current goals can now be found in the care plan section) Acute Rehab PT Goals Patient Stated Goal: to walk Progress towards PT goals: Progressing toward goals    Frequency    7X/week      PT Plan Current plan remains appropriate    Co-evaluation              AM-PAC PT "6 Clicks" Mobility    Outcome Measure  Help needed turning from your back to your side while in a flat bed without using bedrails?: A Little Help needed moving from lying on your back to sitting on the side of a flat bed without using bedrails?: A Little Help needed moving to and from a bed to a chair (including a wheelchair)?: A Little Help needed standing up from a chair using your arms (e.g., wheelchair or bedside chair)?: A Little Help needed to walk in hospital room?: A Little Help needed climbing 3-5 steps with a railing? : A Lot 6 Click Score: 17    End of Session Equipment Utilized During Treatment: Gait belt Activity Tolerance: Patient tolerated treatment well Patient left: in chair;with chair alarm set;with call bell/phone within reach Nurse Communication: Mobility status PT Visit Diagnosis: Muscle weakness (generalized) (M62.81);History of falling (Z91.81);Other abnormalities of gait and mobility (R26.89);Pain Pain - Right/Left: Left Pain - part of body: Knee     Time: 1022-1055 PT Time Calculation (min) (ACUTE ONLY): 33 min  Charges:    $Gait Training: 8-22 mins PT General Charges $$ ACUTE PT VISIT: 1 Visit                     Anise Salvo, PT Acute Rehab Services [facility] [phone]    Rayetta Humphrey 01/21/2023, 12:11 PM

## 2023-01-21 NOTE — Plan of Care (Signed)
  Problem: Activity: Goal: Ability to avoid complications of mobility impairment will improve Outcome: Progressing   Problem: Activity: Goal: Range of joint motion will improve Outcome: Progressing   Problem: Pain Management: Goal: Pain level will decrease with appropriate interventions Outcome: Progressing

## 2023-01-22 ENCOUNTER — Emergency Department (HOSPITAL_COMMUNITY): Payer: 59

## 2023-01-22 ENCOUNTER — Emergency Department (HOSPITAL_COMMUNITY)
Admission: EM | Admit: 2023-01-22 | Discharge: 2023-01-23 | Disposition: A | Discharge status: Home / Self Care | Payer: 59 | Attending: Emergency Medicine | Admitting: Emergency Medicine

## 2023-01-22 ENCOUNTER — Other Ambulatory Visit: Payer: Self-pay

## 2023-01-22 ENCOUNTER — Encounter (HOSPITAL_COMMUNITY): Payer: Self-pay

## 2023-01-22 DIAGNOSIS — Y92019 Unspecified place in single-family (private) house as the place of occurrence of the external cause: Secondary | ICD-10-CM | POA: Diagnosis not present

## 2023-01-22 DIAGNOSIS — M25562 Pain in left knee: Secondary | ICD-10-CM | POA: Diagnosis not present

## 2023-01-22 DIAGNOSIS — Z96652 Presence of left artificial knee joint: Secondary | ICD-10-CM | POA: Diagnosis not present

## 2023-01-22 DIAGNOSIS — M79605 Pain in left leg: Secondary | ICD-10-CM | POA: Diagnosis not present

## 2023-01-22 DIAGNOSIS — Z7982 Long term (current) use of aspirin: Secondary | ICD-10-CM | POA: Diagnosis not present

## 2023-01-22 DIAGNOSIS — M47816 Spondylosis without myelopathy or radiculopathy, lumbar region: Secondary | ICD-10-CM | POA: Diagnosis not present

## 2023-01-22 DIAGNOSIS — Z79899 Other long term (current) drug therapy: Secondary | ICD-10-CM | POA: Diagnosis not present

## 2023-01-22 DIAGNOSIS — R609 Edema, unspecified: Secondary | ICD-10-CM | POA: Diagnosis not present

## 2023-01-22 DIAGNOSIS — W19XXXA Unspecified fall, initial encounter: Secondary | ICD-10-CM | POA: Diagnosis not present

## 2023-01-22 MED ORDER — OXYCODONE HCL 5 MG PO TABS
10.0000 mg | ORAL_TABLET | Freq: Once | ORAL | Status: AC
  Administered 2023-01-22: 10 mg via ORAL
  Filled 2023-01-22: qty 2

## 2023-01-22 NOTE — ED Triage Notes (Signed)
BIBA- pt had fall getting into the house today. Recently had left knee replacement 3 days ago. Left leg shortening/rotation in triage. Denies hitting head. Denies blood thinners.

## 2023-01-22 NOTE — ED Provider Notes (Signed)
[place] EMERGENCY DEPARTMENT AT [facility] Provider Note   CSN: [id number] Arrival date & time: 01/22/23  1930     History  Chief Complaint  Patient presents with   Fall    Left knee/leg pain    Veronica Leblanc is a 62 y.o. female with history of a left total knee replacement on 8/5 with Dr Dannielle Huh, presenting to the ED with a mechanical fall at home, reports that her leg buckled underneath her and she fell onto her left side at home.  She reports he is having worsening pain in her left knee.  She called an ambulance to bring her in.  She was prescribed oxycodone and Flexeril at home and reports she was trying to go upstairs to take her pain meds when this happened.  She typically walks with a walker and she was discharged from the hospital 2 days ago.  She is on aspirin 81 mg  HPI     Home Medications Prior to Admission medications   Medication Sig Start Date End Date Taking? Authorizing Provider  albuterol (VENTOLIN HFA) 108 (90 Base) MCG/ACT inhaler Inhale 2 puffs into the lungs every 4 (four) hours as needed. 11/19/20   Waymon Budge, MD  aspirin 81 MG chewable tablet Chew 1 tablet (81 mg total) by mouth 2 (two) times daily. 01/19/23   Guy Sandifer, PA  budesonide-formoterol [facility]) 160-4.5 MCG/ACT inhaler Inhale 2 puffs into the lungs 2 (two) times daily.    [provider]  calcium carbonate (OS-CAL) 600 MG tablet Take 600 mg by mouth 2 (two) times daily with a meal.    [provider]  cetirizine (ZYRTEC) 10 MG tablet Take 10 mg by mouth daily.    [provider]  citalopram (CELEXA) 40 MG tablet Take 40 mg by mouth at bedtime.    [provider]  cyclobenzaprine (FLEXERIL) 5 MG tablet Take 5-10 mg by mouth 3 (three) times daily as needed for muscle spasms. 11/09/22   [provider]  diclofenac Sodium (VOLTAREN) 1 % GEL Apply 4 g topically 4 (four) times daily. 12/21/22   [provider]   empagliflozin (JARDIANCE) 25 MG TABS tablet Take 25 mg by mouth daily. 08/28/19   [provider]  EPINEPHrine 0.3 mg/0.3 mL IJ SOAJ injection Use as directed for life-threatening allergic reaction. 10/16/20   Marcelyn Bruins, MD  furosemide (LASIX) 20 MG tablet Take 20 mg by mouth daily as needed for fluid or edema.    [provider]  gabapentin (NEURONTIN) 300 MG capsule Take 300 mg by mouth 2 (two) times daily.    [provider]  hydrOXYzine (VISTARIL) 50 MG capsule Take 50-100 mg by mouth every 6 (six) hours as needed for anxiety. 12/09/22   [provider]  Melatonin 10 MG TABS Take 60 mg by mouth at bedtime.    [provider]  methocarbamol (ROBAXIN) 500 MG tablet Take 1-2 tablets (500-1,000 mg total) by mouth every 6 (six) hours as needed for muscle spasms. 01/19/23   Guy Sandifer, PA  metoprolol succinate (TOPROL-XL) 50 MG 24 hr tablet Take 1 tablet (50 mg total) by mouth daily. Take with or immediately following a meal. 07/21/21   Georgeanna Lea, MD  mirabegron ER (MYRBETRIQ) 50 MG TB24 tablet Take 50 mg by mouth daily. 10/05/21   [provider]  montelukast (SINGULAIR) 10 MG tablet Take 10 mg by mouth at bedtime.    [provider]  omeprazole (PRILOSEC) 40 MG capsule Take 40 mg by mouth daily. 11/15/22   [provider]  ondansetron (ZOFRAN) 4 MG tablet Take 4-8 mg by mouth every 8 (eight) hours as needed for vomiting or nausea. 07/31/22   [provider]  oxyCODONE (OXY IR/ROXICODONE) 5 MG immediate release tablet Take 1-2 tablets (5-10 mg total) by mouth every 6 (six) hours as needed for moderate pain (pain score 4-6). 01/19/23   Guy Sandifer, PA  rosuvastatin (CRESTOR) 40 MG tablet Take 40 mg by mouth at bedtime. 08/22/19   [provider]  temazepam (RESTORIL) 30 MG capsule Take 1 capsule by mouth at bedtime as needed for sleep. Patient taking differently: Take 30 mg by mouth at  bedtime as needed for sleep. 08/26/22   Jetty Duhamel D, MD  topiramate (TOPAMAX) 50 MG tablet Take 50 mg by mouth at bedtime.    [provider]      Allergies    Lisinopril, Reglan [metoclopramide], Stadol [butorphanol], Amitriptyline hcl, Cefazolin, Ceftriaxone sodium, Ciprofloxacin, Darvocet [propoxyphene n-acetaminophen], Penicillins, Prochlorperazine edisylate, Promethazine hcl, Propoxyphene, Sulfamethoxazole, Sulfonamide derivatives, and Sumatriptan    Review of Systems   Review of Systems  Physical Exam Updated Vital Signs BP (!) 152/79 (BP Location: Left Arm)   Pulse 88   Temp 99.3 F (37.4 C) (Oral)   Resp 18   Ht 5\' 4"  (1.626 m)   Wt 108.9 kg   LMP  (LMP Unknown)   SpO2 99%   BMI 41.20 kg/m  Physical Exam Constitutional:      General: She is not in acute distress.    Appearance: She is obese.  HENT:     Head: Normocephalic and atraumatic.  Eyes:     Conjunctiva/sclera: Conjunctivae normal.     Pupils: Pupils are equal, round, and reactive to light.  Cardiovascular:     Rate and Rhythm: Normal rate and regular rhythm.  Pulmonary:     Effort: Pulmonary effort is normal. No respiratory distress.  Abdominal:     General: There is no distension.     Tenderness: There is no abdominal tenderness.  Musculoskeletal:     Comments: Patient is able to passively and actively flex her left knee to approximately 45 degrees; surgical wound appears clean and intact There is some tenderness and warm effusion involving the lateral aspects of the left thigh and the left knee joint, no shortening or inversion or eversion noted the left lower extremity, no significant pelvic tenderness  Skin:    General: Skin is warm and dry.  Neurological:     General: No focal deficit present.     Mental Status: She is alert. Mental status is at baseline.  Psychiatric:        Mood and Affect: Mood normal.        Behavior: Behavior normal.     ED Results / Procedures / Treatments    Labs (all labs ordered are listed, but only abnormal results are displayed) Labs Reviewed - No data to display  EKG None  Radiology DG HIP UNILAT WITH PELVIS 2-3 VIEWS LEFT  Result Date: 01/22/2023 CLINICAL DATA:  Fall, left leg pain EXAM: DG HIP (WITH OR WITHOUT PELVIS) 2-3V LEFT COMPARISON:  None Available. FINDINGS: No fracture or dislocation is seen. Bilateral hip joint spaces are preserved. Visualized bony pelvis is intact. Mild degenerative changes of the lower lumbar spine. IMPRESSION: Negative. Electronically Signed   By: Charline Bills M.D.   On: 01/22/2023 21:41  DG Femur Min 2 Views Left  Result Date: 01/22/2023 CLINICAL DATA:  Fall, left leg pain EXAM: LEFT FEMUR 2 VIEWS COMPARISON:  None Available. FINDINGS: No fracture or dislocation is seen. The joint spaces are preserved. Visualized soft tissues are within normal limits. IMPRESSION: Negative. Electronically Signed   By: Charline Bills M.D.   On: 01/22/2023 21:40   DG Knee Complete 4 Views Left  Result Date: 01/22/2023 CLINICAL DATA:  Fall, left leg pain EXAM: LEFT KNEE - COMPLETE 4+ VIEW COMPARISON:  None Available. FINDINGS: Left knee arthroplasty, without evidence of complication. No fracture or dislocation is seen. Visualized soft tissues are within normal limits. No suprapatellar knee joint effusion. IMPRESSION: Left knee arthroplasty, without evidence of complication. Electronically Signed   By: Charline Bills M.D.   On: 01/22/2023 21:40    Procedures Procedures    Medications Ordered in ED Medications  oxyCODONE (Oxy IR/ROXICODONE) immediate release tablet 10 mg (10 mg Oral Given 01/22/23 2038)    ED Course/ Medical Decision Making/ A&P Clinical Course as of 01/22/23 2317  Sat Jan 22, 2023  2232 No acute injuries noted on x-ray imaging.  Will attempt to ambulate the patient with walker [MT]    Clinical Course User Index [MT] , Kermit Balo, MD                                 Medical Decision  Making Amount and/or Complexity of Data Reviewed Radiology: ordered.  Risk Prescription drug management.   Patient is presenting after mechanical fall at home complaining of worsening pain in her left knee, which is approximately 5 days postoperative from a replacement.  I do not see evidence of a large developing hematoma.  She is not on blood thinning medications.  I have ordered x-ray imaging, and I personally reviewed and interpreted these images, notable for no acute injury or fracture  Oxycodone was ordered for pain.  Low suspicion for acute DVT or infection.  We will attempt to ambulate the patient with a walker.  She is able to bear weight I would not dissipate discharge home where she already has pain medication, as well as a walker.  She can follow-up with her surgeon.        Final Clinical Impression(s) / ED Diagnoses Final diagnoses:  Fall, initial encounter  Left leg pain    Rx / DC Orders ED Discharge Orders     None         , Kermit Balo, MD 01/22/23 2317

## 2023-01-22 NOTE — Discharge Instructions (Addendum)
Please use your walker at all times at home, and call your orthopedic doctor's office to discuss a follow up appointment.

## 2023-01-23 DIAGNOSIS — M79605 Pain in left leg: Secondary | ICD-10-CM | POA: Diagnosis not present

## 2023-01-23 MED ORDER — KETOROLAC TROMETHAMINE 30 MG/ML IJ SOLN
30.0000 mg | Freq: Once | INTRAMUSCULAR | Status: AC
  Administered 2023-01-23: 30 mg via INTRAMUSCULAR
  Filled 2023-01-23: qty 1

## 2023-01-23 MED ORDER — ACETAMINOPHEN 500 MG PO TABS
1000.0000 mg | ORAL_TABLET | Freq: Once | ORAL | Status: DC
  Filled 2023-01-23: qty 2

## 2023-01-23 MED ORDER — IBUPROFEN 800 MG PO TABS: 800.0000 mg | ORAL_TABLET | Freq: Once | ORAL | Status: DC

## 2023-01-23 NOTE — ED Notes (Signed)
Pt wants medications for pain after. discharged

## 2023-01-27 DIAGNOSIS — R2689 Other abnormalities of gait and mobility: Secondary | ICD-10-CM | POA: Diagnosis not present

## 2023-01-27 DIAGNOSIS — M1712 Unilateral primary osteoarthritis, left knee: Secondary | ICD-10-CM | POA: Diagnosis not present

## 2023-01-27 DIAGNOSIS — M25462 Effusion, left knee: Secondary | ICD-10-CM | POA: Diagnosis not present

## 2023-01-27 DIAGNOSIS — Z96652 Presence of left artificial knee joint: Secondary | ICD-10-CM | POA: Diagnosis not present

## 2023-01-27 DIAGNOSIS — M25562 Pain in left knee: Secondary | ICD-10-CM | POA: Diagnosis not present

## 2023-01-27 DIAGNOSIS — Z471 Aftercare following joint replacement surgery: Secondary | ICD-10-CM | POA: Diagnosis not present

## 2023-01-27 DIAGNOSIS — M6281 Muscle weakness (generalized): Secondary | ICD-10-CM | POA: Diagnosis not present

## 2023-01-31 ENCOUNTER — Telehealth: Payer: Self-pay

## 2023-01-31 NOTE — Telephone Encounter (Signed)
Transition Care Management Unsuccessful Follow-up Telephone Call  Date of discharge and from where:  01/23/2023 [facility]  Attempts:  1st Attempt  Reason for unsuccessful TCM follow-up call:  Left voice message  [facility]  [facility] Population Health Community Resource Care Guide   ??[email or web address]  ?? [phone]   Website: [email or web address]  [email or web address]

## 2023-01-31 NOTE — Telephone Encounter (Signed)
Transition Care Management Unsuccessful Follow-up Telephone Call  Date of discharge and from where:  01/23/2023 [facility]  Attempts:  2nd Attempt  Reason for unsuccessful TCM follow-up call:  Left voice message  Farrell Pantaleo Sharol R[facility]  [facility] Population Health Community Resource Care Guide   [email or web address]  ?? [phone]   Website: [email or web address]  [email or web address]

## 2023-02-01 ENCOUNTER — Other Ambulatory Visit: Payer: Self-pay | Admitting: Internal Medicine

## 2023-02-02 NOTE — Telephone Encounter (Signed)
I have refilled temazepam for 1 month and 1 refill. She is overdue for office follow-up. Will not refill temazepam more until she is seen. Ok to use held spot if no routine opening in next 2 months.

## 2023-02-03 DIAGNOSIS — M1712 Unilateral primary osteoarthritis, left knee: Secondary | ICD-10-CM | POA: Diagnosis not present

## 2023-02-03 DIAGNOSIS — M6281 Muscle weakness (generalized): Secondary | ICD-10-CM | POA: Diagnosis not present

## 2023-02-03 DIAGNOSIS — M25562 Pain in left knee: Secondary | ICD-10-CM | POA: Diagnosis not present

## 2023-02-03 DIAGNOSIS — R2689 Other abnormalities of gait and mobility: Secondary | ICD-10-CM | POA: Diagnosis not present

## 2023-02-05 DIAGNOSIS — R110 Nausea: Secondary | ICD-10-CM | POA: Diagnosis not present

## 2023-02-05 DIAGNOSIS — Z743 Need for continuous supervision: Secondary | ICD-10-CM | POA: Diagnosis not present

## 2023-02-05 DIAGNOSIS — R109 Unspecified abdominal pain: Secondary | ICD-10-CM | POA: Diagnosis not present

## 2023-02-05 DIAGNOSIS — N3289 Other specified disorders of bladder: Secondary | ICD-10-CM | POA: Diagnosis not present

## 2023-02-05 DIAGNOSIS — R1084 Generalized abdominal pain: Secondary | ICD-10-CM | POA: Diagnosis not present

## 2023-02-05 DIAGNOSIS — N390 Urinary tract infection, site not specified: Secondary | ICD-10-CM | POA: Diagnosis not present

## 2023-02-05 DIAGNOSIS — K5900 Constipation, unspecified: Secondary | ICD-10-CM | POA: Diagnosis not present

## 2023-02-06 DIAGNOSIS — N390 Urinary tract infection, site not specified: Secondary | ICD-10-CM | POA: Diagnosis not present

## 2023-02-06 DIAGNOSIS — R109 Unspecified abdominal pain: Secondary | ICD-10-CM | POA: Diagnosis not present

## 2023-02-06 DIAGNOSIS — R110 Nausea: Secondary | ICD-10-CM | POA: Diagnosis not present

## 2023-02-07 DIAGNOSIS — Z743 Need for continuous supervision: Secondary | ICD-10-CM | POA: Diagnosis not present

## 2023-02-07 DIAGNOSIS — R2689 Other abnormalities of gait and mobility: Secondary | ICD-10-CM | POA: Diagnosis not present

## 2023-02-07 DIAGNOSIS — I10 Essential (primary) hypertension: Secondary | ICD-10-CM | POA: Diagnosis not present

## 2023-02-07 DIAGNOSIS — K219 Gastro-esophageal reflux disease without esophagitis: Secondary | ICD-10-CM | POA: Diagnosis not present

## 2023-02-07 DIAGNOSIS — B951 Streptococcus, group B, as the cause of diseases classified elsewhere: Secondary | ICD-10-CM | POA: Diagnosis not present

## 2023-02-07 DIAGNOSIS — E7800 Pure hypercholesterolemia, unspecified: Secondary | ICD-10-CM | POA: Diagnosis not present

## 2023-02-07 DIAGNOSIS — R531 Weakness: Secondary | ICD-10-CM | POA: Diagnosis not present

## 2023-02-07 DIAGNOSIS — R4781 Slurred speech: Secondary | ICD-10-CM | POA: Diagnosis not present

## 2023-02-07 DIAGNOSIS — Z20822 Contact with and (suspected) exposure to covid-19: Secondary | ICD-10-CM | POA: Diagnosis not present

## 2023-02-07 DIAGNOSIS — M6281 Muscle weakness (generalized): Secondary | ICD-10-CM | POA: Diagnosis not present

## 2023-02-07 DIAGNOSIS — G459 Transient cerebral ischemic attack, unspecified: Secondary | ICD-10-CM | POA: Diagnosis not present

## 2023-02-07 DIAGNOSIS — I499 Cardiac arrhythmia, unspecified: Secondary | ICD-10-CM | POA: Diagnosis not present

## 2023-02-07 DIAGNOSIS — Z79899 Other long term (current) drug therapy: Secondary | ICD-10-CM | POA: Diagnosis not present

## 2023-02-07 DIAGNOSIS — R404 Transient alteration of awareness: Secondary | ICD-10-CM | POA: Diagnosis not present

## 2023-02-07 DIAGNOSIS — M25562 Pain in left knee: Secondary | ICD-10-CM | POA: Diagnosis not present

## 2023-02-07 DIAGNOSIS — G43909 Migraine, unspecified, not intractable, without status migrainosus: Secondary | ICD-10-CM | POA: Diagnosis not present

## 2023-02-07 DIAGNOSIS — R42 Dizziness and giddiness: Secondary | ICD-10-CM | POA: Diagnosis not present

## 2023-02-07 DIAGNOSIS — R110 Nausea: Secondary | ICD-10-CM | POA: Diagnosis not present

## 2023-02-07 DIAGNOSIS — N390 Urinary tract infection, site not specified: Secondary | ICD-10-CM | POA: Diagnosis not present

## 2023-02-07 DIAGNOSIS — M1712 Unilateral primary osteoarthritis, left knee: Secondary | ICD-10-CM | POA: Diagnosis not present

## 2023-02-07 DIAGNOSIS — Z8673 Personal history of transient ischemic attack (TIA), and cerebral infarction without residual deficits: Secondary | ICD-10-CM | POA: Diagnosis not present

## 2023-02-08 DIAGNOSIS — Z20822 Contact with and (suspected) exposure to covid-19: Secondary | ICD-10-CM | POA: Diagnosis not present

## 2023-02-08 DIAGNOSIS — G459 Transient cerebral ischemic attack, unspecified: Secondary | ICD-10-CM | POA: Diagnosis not present

## 2023-02-08 DIAGNOSIS — I639 Cerebral infarction, unspecified: Secondary | ICD-10-CM | POA: Diagnosis not present

## 2023-02-08 DIAGNOSIS — I10 Essential (primary) hypertension: Secondary | ICD-10-CM | POA: Diagnosis not present

## 2023-02-08 DIAGNOSIS — R42 Dizziness and giddiness: Secondary | ICD-10-CM | POA: Diagnosis not present

## 2023-02-08 DIAGNOSIS — G43909 Migraine, unspecified, not intractable, without status migrainosus: Secondary | ICD-10-CM | POA: Diagnosis not present

## 2023-02-08 DIAGNOSIS — N390 Urinary tract infection, site not specified: Secondary | ICD-10-CM | POA: Diagnosis not present

## 2023-02-08 DIAGNOSIS — B951 Streptococcus, group B, as the cause of diseases classified elsewhere: Secondary | ICD-10-CM | POA: Diagnosis not present

## 2023-02-08 DIAGNOSIS — Z79899 Other long term (current) drug therapy: Secondary | ICD-10-CM | POA: Diagnosis not present

## 2023-02-08 DIAGNOSIS — Z8673 Personal history of transient ischemic attack (TIA), and cerebral infarction without residual deficits: Secondary | ICD-10-CM | POA: Diagnosis not present

## 2023-02-08 DIAGNOSIS — I6782 Cerebral ischemia: Secondary | ICD-10-CM | POA: Diagnosis not present

## 2023-02-08 DIAGNOSIS — K219 Gastro-esophageal reflux disease without esophagitis: Secondary | ICD-10-CM | POA: Diagnosis not present

## 2023-02-08 DIAGNOSIS — R110 Nausea: Secondary | ICD-10-CM | POA: Diagnosis not present

## 2023-02-09 DIAGNOSIS — Z8673 Personal history of transient ischemic attack (TIA), and cerebral infarction without residual deficits: Secondary | ICD-10-CM | POA: Diagnosis not present

## 2023-02-09 DIAGNOSIS — N390 Urinary tract infection, site not specified: Secondary | ICD-10-CM | POA: Diagnosis not present

## 2023-02-09 DIAGNOSIS — I10 Essential (primary) hypertension: Secondary | ICD-10-CM | POA: Diagnosis not present

## 2023-02-09 DIAGNOSIS — Z79899 Other long term (current) drug therapy: Secondary | ICD-10-CM | POA: Diagnosis not present

## 2023-02-09 DIAGNOSIS — R110 Nausea: Secondary | ICD-10-CM | POA: Diagnosis not present

## 2023-02-09 DIAGNOSIS — R42 Dizziness and giddiness: Secondary | ICD-10-CM | POA: Diagnosis not present

## 2023-02-09 DIAGNOSIS — Z20822 Contact with and (suspected) exposure to covid-19: Secondary | ICD-10-CM | POA: Diagnosis not present

## 2023-02-09 DIAGNOSIS — G459 Transient cerebral ischemic attack, unspecified: Secondary | ICD-10-CM | POA: Diagnosis not present

## 2023-02-09 DIAGNOSIS — G43909 Migraine, unspecified, not intractable, without status migrainosus: Secondary | ICD-10-CM | POA: Diagnosis not present

## 2023-02-09 DIAGNOSIS — K219 Gastro-esophageal reflux disease without esophagitis: Secondary | ICD-10-CM | POA: Diagnosis not present

## 2023-02-09 DIAGNOSIS — B951 Streptococcus, group B, as the cause of diseases classified elsewhere: Secondary | ICD-10-CM | POA: Diagnosis not present

## 2023-02-10 DIAGNOSIS — B951 Streptococcus, group B, as the cause of diseases classified elsewhere: Secondary | ICD-10-CM | POA: Diagnosis not present

## 2023-02-10 DIAGNOSIS — G43909 Migraine, unspecified, not intractable, without status migrainosus: Secondary | ICD-10-CM | POA: Diagnosis not present

## 2023-02-10 DIAGNOSIS — I10 Essential (primary) hypertension: Secondary | ICD-10-CM | POA: Diagnosis not present

## 2023-02-10 DIAGNOSIS — G459 Transient cerebral ischemic attack, unspecified: Secondary | ICD-10-CM | POA: Diagnosis not present

## 2023-02-10 DIAGNOSIS — N390 Urinary tract infection, site not specified: Secondary | ICD-10-CM | POA: Diagnosis not present

## 2023-02-10 DIAGNOSIS — R42 Dizziness and giddiness: Secondary | ICD-10-CM | POA: Diagnosis not present

## 2023-02-10 DIAGNOSIS — Z8673 Personal history of transient ischemic attack (TIA), and cerebral infarction without residual deficits: Secondary | ICD-10-CM | POA: Diagnosis not present

## 2023-02-10 DIAGNOSIS — Z79899 Other long term (current) drug therapy: Secondary | ICD-10-CM | POA: Diagnosis not present

## 2023-02-10 DIAGNOSIS — K219 Gastro-esophageal reflux disease without esophagitis: Secondary | ICD-10-CM | POA: Diagnosis not present

## 2023-02-10 DIAGNOSIS — R110 Nausea: Secondary | ICD-10-CM | POA: Diagnosis not present

## 2023-02-10 DIAGNOSIS — Z20822 Contact with and (suspected) exposure to covid-19: Secondary | ICD-10-CM | POA: Diagnosis not present

## 2023-02-11 DIAGNOSIS — Z743 Need for continuous supervision: Secondary | ICD-10-CM | POA: Diagnosis not present

## 2023-02-11 DIAGNOSIS — B951 Streptococcus, group B, as the cause of diseases classified elsewhere: Secondary | ICD-10-CM | POA: Diagnosis not present

## 2023-02-11 DIAGNOSIS — G43909 Migraine, unspecified, not intractable, without status migrainosus: Secondary | ICD-10-CM | POA: Diagnosis not present

## 2023-02-11 DIAGNOSIS — G459 Transient cerebral ischemic attack, unspecified: Secondary | ICD-10-CM | POA: Diagnosis not present

## 2023-02-11 DIAGNOSIS — R531 Weakness: Secondary | ICD-10-CM | POA: Diagnosis not present

## 2023-02-11 DIAGNOSIS — Z79899 Other long term (current) drug therapy: Secondary | ICD-10-CM | POA: Diagnosis not present

## 2023-02-11 DIAGNOSIS — Z8673 Personal history of transient ischemic attack (TIA), and cerebral infarction without residual deficits: Secondary | ICD-10-CM | POA: Diagnosis not present

## 2023-02-11 DIAGNOSIS — I10 Essential (primary) hypertension: Secondary | ICD-10-CM | POA: Diagnosis not present

## 2023-02-11 DIAGNOSIS — R42 Dizziness and giddiness: Secondary | ICD-10-CM | POA: Diagnosis not present

## 2023-02-11 DIAGNOSIS — R110 Nausea: Secondary | ICD-10-CM | POA: Diagnosis not present

## 2023-02-11 DIAGNOSIS — Z20822 Contact with and (suspected) exposure to covid-19: Secondary | ICD-10-CM | POA: Diagnosis not present

## 2023-02-11 DIAGNOSIS — K219 Gastro-esophageal reflux disease without esophagitis: Secondary | ICD-10-CM | POA: Diagnosis not present

## 2023-02-11 DIAGNOSIS — Z7401 Bed confinement status: Secondary | ICD-10-CM | POA: Diagnosis not present

## 2023-02-11 DIAGNOSIS — N390 Urinary tract infection, site not specified: Secondary | ICD-10-CM | POA: Diagnosis not present

## 2023-04-17 ENCOUNTER — Other Ambulatory Visit: Payer: Self-pay | Admitting: Internal Medicine

## 2023-04-22 NOTE — Telephone Encounter (Signed)
Temazepam refilled.

## 2023-04-26 ENCOUNTER — Ambulatory Visit: Payer: 59 | Admitting: Cardiology

## 2023-06-09 ENCOUNTER — Ambulatory Visit: Payer: 59 | Admitting: Cardiology

## 2023-08-02 ENCOUNTER — Ambulatory Visit: Payer: 59 | Admitting: Internal Medicine

## 2023-08-02 NOTE — Progress Notes (Deleted)
 HPI F never smoker followed for OSA, Insomnia, complicated by CVA, OSA, Endometriosis, Asthma, Allergic Rhinitis, HBP,  NPSG [facility] 05/05/10- AHI 9.6/ hr, RDI 13.2/ hr, desaturation to 77%.  -----------------------------------------------------------------------------------------------------------------------------------------   10/26/21-  61 yoF never smoker followed for OSA, Insomnia, Asthma,  complicated by CVA, OSA, Endometriosis,  Allergic Rhinitis, HBP, DM2,   -Singulair, melatonin, Vistaril, Zyrtec, Symbicort 160, albuterol hfa, Temazepam, CPAP  Auto 5-15/ Home O2 sleep/ [employer] Download- compliance 67%, AHI 0.4/ hr     Short nights Body weight today-269 lbs Covid vax- 3 Moderna Flu vax- no -----Patients daughter states that she stops breathing more at night when sleeping. Patient states that she wears her CPAP but not as much as she should but she knocks it off while sleeping.  She and her daughter are not clear themselves about whether she is pulling CPAP mask off in her sleep or simply dislodging it.  They do not describe complex parasomnias but she can get restless.  Questions if temazepam works well enough and I suggested we try Lunesta.  She is interested in trying different CPAP mask so we will arrange mask fitting.  Download reviewed with very good control when used.  08/02/23-  63 yoF never smoker followed for OSA, Insomnia, Asthma,  complicated by CVA, OSA, Endometriosis,  Allergic Rhinitis, HBP, DM2,   -Singulair, melatonin, Vistaril, Zyrtec, Symbicort 160, albuterol hfa,  CPAP  Auto 5-15/ Home O2 sleep/ [employer] Download- compliance  Body weight today-    ROS-see HPI   + = positive Constitutional:    weight loss, night sweats, fevers, chills, +fatigue, lassitude. HEENT:    +headaches, difficulty swallowing, tooth/dental problems, sore throat,       +sneezing, itching, ear ache, +nasal congestion, post nasal drip, snoring CV:    chest pain, orthopnea, PND, +swelling  in lower extremities, anasarca,                                   dizziness, palpitations Resp:   shortness of breath with exertion or at rest.                +productive cough,   +non-productive cough, coughing up of blood.              change in color of mucus.  +wheezing.   Skin:    rash or lesions. GI:  No-   heartburn, indigestion, abdominal pain, nausea, vomiting, diarrhea,                 change in bowel habits, loss of appetite GU: dysuria, change in color of urine, no urgency or frequency.   flank pain. MS:   joint pain, stiffness, decreased range of motion, back pain. Neuro-     nothing unusual Psych:  change in mood or affect.  depression or anxiety.   memory loss.  OBJ- Physical Exam General- Alert, Oriented, Affect-appropriate, Distress- none acute, + obese Skin- rash-none, lesions- none, excoriation- none Lymphadenopathy- none Head- atraumatic            Eyes- Gross vision intact, PERRLA, conjunctivae and secretions clear            Ears- Hearing, canals-normal            Nose- Clear, no-Septal dev, mucus, polyps, erosion, perforation             Throat- Mallampati III , mucosa clear , drainage- none, tonsils- atrophic,  +  many missing teeth Neck- flexible , trachea midline, no stridor , thyroid nl, carotid no bruit Chest - symmetrical excursion , unlabored           Heart/CV- RRR , no murmur , no gallop  , no rub, nl s1 s2                           - JVD- none , edema- none, stasis changes- none, varices- none           Lung- clear to P&A, wheeze- none, cough- none , dullness-none, rub- none           Chest wall-  Abd-  Br/ Gen/ Rectal- Not done, not indicated Extrem- cyanosis- none, clubbing, none, atrophy- none, strength- nl Neuro- grossly intact to observation

## 2023-08-04 ENCOUNTER — Other Ambulatory Visit: Payer: Self-pay

## 2023-08-04 ENCOUNTER — Emergency Department (HOSPITAL_COMMUNITY)
Admission: EM | Admit: 2023-08-04 | Discharge: 2023-08-04 | Disposition: A | Discharge status: Home / Self Care | Payer: 59 | Attending: Emergency Medicine | Admitting: Emergency Medicine

## 2023-08-04 ENCOUNTER — Emergency Department (HOSPITAL_COMMUNITY): Payer: 59

## 2023-08-04 ENCOUNTER — Encounter (HOSPITAL_COMMUNITY): Payer: Self-pay

## 2023-08-04 DIAGNOSIS — D72829 Elevated white blood cell count, unspecified: Secondary | ICD-10-CM | POA: Diagnosis not present

## 2023-08-04 DIAGNOSIS — R519 Headache, unspecified: Secondary | ICD-10-CM

## 2023-08-04 DIAGNOSIS — Z96652 Presence of left artificial knee joint: Secondary | ICD-10-CM | POA: Insufficient documentation

## 2023-08-04 DIAGNOSIS — Z8673 Personal history of transient ischemic attack (TIA), and cerebral infarction without residual deficits: Secondary | ICD-10-CM | POA: Diagnosis not present

## 2023-08-04 DIAGNOSIS — J4520 Mild intermittent asthma, uncomplicated: Secondary | ICD-10-CM | POA: Insufficient documentation

## 2023-08-04 DIAGNOSIS — Z7982 Long term (current) use of aspirin: Secondary | ICD-10-CM | POA: Insufficient documentation

## 2023-08-04 DIAGNOSIS — E119 Type 2 diabetes mellitus without complications: Secondary | ICD-10-CM | POA: Insufficient documentation

## 2023-08-04 DIAGNOSIS — G43809 Other migraine, not intractable, without status migrainosus: Secondary | ICD-10-CM | POA: Diagnosis not present

## 2023-08-04 DIAGNOSIS — I10 Essential (primary) hypertension: Secondary | ICD-10-CM | POA: Diagnosis not present

## 2023-08-04 LAB — CBC
HCT: 45.2 % (ref 36.0–46.0)
Hemoglobin: 15.3 g/dL — ABNORMAL HIGH (ref 12.0–15.0)
MCH: 28.4 pg (ref 26.0–34.0)
MCHC: 33.8 g/dL (ref 30.0–36.0)
MCV: 83.9 fL (ref 80.0–100.0)
Platelets: 334 10*3/uL (ref 150–400)
RBC: 5.39 MIL/uL — ABNORMAL HIGH (ref 3.87–5.11)
RDW: 13.9 % (ref 11.5–15.5)
WBC: 11.5 10*3/uL — ABNORMAL HIGH (ref 4.0–10.5)
nRBC: 0 % (ref 0.0–0.2)

## 2023-08-04 LAB — COMPREHENSIVE METABOLIC PANEL
ALT: 25 U/L (ref 0–44)
AST: 38 U/L (ref 15–41)
Albumin: 4.4 g/dL (ref 3.5–5.0)
Alkaline Phosphatase: 125 U/L (ref 38–126)
Anion gap: 20 — ABNORMAL HIGH (ref 5–15)
BUN: 9 mg/dL (ref 8–23)
CO2: 18 mmol/L — ABNORMAL LOW (ref 22–32)
Calcium: 10.5 mg/dL — ABNORMAL HIGH (ref 8.9–10.3)
Chloride: 102 mmol/L (ref 98–111)
Creatinine, Ser: 0.98 mg/dL (ref 0.44–1.00)
GFR, Estimated: >60 mL/min (ref >60)
Glucose, Bld: 134 mg/dL — ABNORMAL HIGH (ref 70–99)
Potassium: 3.8 mmol/L (ref 3.5–5.1)
Sodium: 140 mmol/L (ref 135–145)
Total Bilirubin: 1.5 mg/dL — ABNORMAL HIGH (ref 0.0–1.2)
Total Protein: 8.4 g/dL — ABNORMAL HIGH (ref 6.5–8.1)

## 2023-08-04 LAB — LIPASE, BLOOD: Lipase: 21 U/L (ref 11–51)

## 2023-08-04 LAB — RESP PANEL BY RT-PCR (RSV, FLU A&B, COVID)  RVPGX2
Influenza A by PCR: NEGATIVE
Influenza B by PCR: NEGATIVE
Resp Syncytial Virus by PCR: NEGATIVE
SARS Coronavirus 2 by RT PCR: NEGATIVE

## 2023-08-04 MED ORDER — LACTATED RINGERS IV BOLUS
1000.0000 mL | Freq: Once | INTRAVENOUS | Status: AC
  Administered 2023-08-04: 1000 mL via INTRAVENOUS

## 2023-08-04 MED ORDER — ONDANSETRON 4 MG PO TBDP: 4.0000 mg | ORAL_TABLET | Freq: Once | ORAL | Status: DC

## 2023-08-04 MED ORDER — LORAZEPAM 1 MG PO TABS
0.5000 mg | ORAL_TABLET | Freq: Once | ORAL | Status: AC
  Administered 2023-08-04: 0.5 mg via ORAL
  Filled 2023-08-04: qty 1

## 2023-08-04 MED ORDER — ACETAMINOPHEN 325 MG PO TABS
650.0000 mg | ORAL_TABLET | Freq: Once | ORAL | Status: DC
  Filled 2023-08-04: qty 2

## 2023-08-04 MED ORDER — DROPERIDOL 2.5 MG/ML IJ SOLN
1.2500 mg | Freq: Once | INTRAMUSCULAR | Status: AC
  Administered 2023-08-04: 1.25 mg via INTRAVENOUS
  Filled 2023-08-04: qty 2

## 2023-08-04 MED ORDER — GADOBUTROL 1 MMOL/ML IV SOLN
10.0000 mL | Freq: Once | INTRAVENOUS | Status: AC | PRN
  Administered 2023-08-04: 10 mL via INTRAVENOUS
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
## 2023-08-04 NOTE — ED Provider Notes (Signed)
[place] EMERGENCY DEPARTMENT AT [facility] Provider Note  Arrival date/time:08/04/2023 9:34 PM  HPI/ROS   Veronica Leblanc is a 63 y.o. female with PMH significant for CVA, OSA, asthma, T2DM, left knee replacement, arthritis, migraines who presents for headache and nausea  History is provided by patient.  Patient endorses symptoms of headache and nausea as well as word finding difficulty starting yesterday early afternoon.  She does not remember what time. She was initially taken to [facility] emergency department last night for same, however she felt that they did not take her complaints very seriously at that time and she was discharged. Her symptoms have persisted and she is complaining that her word finding difficulty feels similar to when she had a stroke in the past. She does have history of migraines, but endorses that this is much worse than typical.  She denies taking blood thinning medications.  She does take a daily aspirin.  A complete ROS was performed with pertinent positives/negatives noted above.   ED Course and Medical Decision Making   I personally reviewed the patient's vitals.  Assessment/Plan: This is a 63 year old patient with history of prior stroke and migraines who is presenting for headache and nausea. On exam, she has word finding difficulty and having difficulty repeating phrases, she can occasionally overcome this deficit with distraction.  She endorses that this is similar to prior CVA.  She also endorses that this is much stronger than her typical migraine.  Workup: CMP with normal sodium, potassium, creatinine.  CO2 mildly decreased at 18.  Glucose is reassuring at 134. CBC shows very mild leukocytosis to 11.5.  No significant anemia. Resp panel negative for COVID, flu, RSV.  CT head shows concern for age-indeterminate left lacunar infarct.  MRI was obtained which showed no acute intracranial abnormality.  On re-evaluation of patient, her  symptoms have improved. She has no word-finding difficulty and is able to speak appropriately without issue.  I discussed reassuring findings and plan for discharge with follow up. Patient feels comfortable with this plan.    Disposition:  I discussed the plan for discharge with the patient and/or their surrogate at bedside prior to discharge and they were in agreement with the plan and verbalized understanding of the return precautions provided. All questions answered to the best of my ability. Ultimately, the patient was discharged in stable condition with stable vital signs. I am reassured that they are capable of close follow up and good social support at home.   Clinical Impression:  1. Nonintractable headache, unspecified chronicity pattern, unspecified headache type   2. Other migraine without status migrainosus, not intractable      Rx / DC Orders ED Discharge Orders          Ordered    Ambulatory referral to Neurology       Comments: An appointment is requested in approximately: 4 weeks   08/04/23 2133            The plan for this patient was discussed with Dr. Denton Lank, who voiced agreement and who oversaw evaluation and treatment of this patient.   Clinical Complexity A medically appropriate history, review of systems, and physical exam was performed.  Patient's presentation is most consistent with acute presentation with potential threat to life or bodily function.  Medical Decision Making Amount and/or Complexity of Data Reviewed Labs: ordered. Radiology: ordered.  Risk Prescription drug management.    Physical Exam and Medical History   Vitals:   08/04/23 1324 08/04/23 1328  08/04/23 1826  BP:  (!) 153/102 119/70  Pulse:  (!) 106 (!) 117  Resp:  (!) 22 19  Temp:  98.5 F (36.9 C) 98.2 F (36.8 C)  TempSrc:  Oral Oral  SpO2: 99% 100% 99%    Physical Exam Vitals and nursing note reviewed.  Constitutional:      General: She is in acute distress.      Appearance: She is well-developed.  HENT:     Head: Normocephalic and atraumatic.  Eyes:     Conjunctiva/sclera: Conjunctivae normal.  Cardiovascular:     Rate and Rhythm: Regular rhythm. Tachycardia present.     Heart sounds: No murmur heard. Abdominal:     Palpations: Abdomen is soft.     Tenderness: There is no abdominal tenderness.  Musculoskeletal:        General: No swelling.     Cervical back: Neck supple.  Skin:    General: Skin is warm and dry.     Capillary Refill: Capillary refill takes less than 2 seconds.  Neurological:     Mental Status: She is alert.     GCS: GCS eye subscore is 4. GCS verbal subscore is 5. GCS motor subscore is 6.     Cranial Nerves: No cranial nerve deficit.     Sensory: No sensory deficit.     Motor: No weakness.     Comments: Word finding difficulty  Psychiatric:        Mood and Affect: Mood normal.     Medical History: Allergies  Allergen Reactions   Lisinopril Anaphylaxis    " Tongue swelling  with some SOB "    Reglan [Metoclopramide] Hives   Stadol [Butorphanol] Hives    Nasal spray    Amitriptyline Hcl Hives and Swelling   Cefazolin Hives and Swelling   Ceftriaxone Sodium Hives and Swelling   Ciprofloxacin Hives and Swelling   Darvocet [Propoxyphene N-Acetaminophen] Hives and Swelling   Penicillins Hives and Swelling   Prochlorperazine Edisylate Hives and Swelling   Promethazine Hcl     Jaw locks up   Propoxyphene Hives and Swelling   Sulfamethoxazole Swelling   Sulfonamide Derivatives Hives and Swelling   Sumatriptan Hives and Swelling   Past Medical History:  Diagnosis Date   Acute, but ill-defined, cerebrovascular disease    Anxiety    Arthritis    Asthma    Asthma, mild intermittent 03/21/2019   Chronic pain of left knee 09/07/2018   Complication of anesthesia    CVA (cerebral vascular accident) [facility])    CVA (cerebral vascular accident) (HCC)    Age 31   Depression    Dyspnea on exertion 09/18/2019    Eczema    Endometriosis, site unspecified    History of migraine headaches    Hypertension    Insomnia 08/30/2019   Late effect of cerebrovascular accident (CVA) 09/18/2019   Nocturnal hypoxemia 05/10/2020   Obstructive sleep apnea 09/09/2010   PSG from 05/06/10>>AHI 9.6, SpO2 low 77% from [place].   CPAP titration 07/16/10>>CPAP 8 cm from [place].    OSA (obstructive sleep apnea)    Palpitations 09/18/2019   PONV (postoperative nausea and vomiting)    S/P left knee arthroscopy 05/08/2019   Type 2 diabetes mellitus without complication, without long-term current use of insulin (HCC) 09/18/2019   Urticaria     Past Surgical History:  Procedure Laterality Date   APPENDECTOMY     CESAREAN SECTION     CHOLECYSTECTOMY     ganglionectomy  c2/c3 right and left side   NECK SURGERY     5 times   presacral neurectomy     SALPINGOOPHORECTOMY     left   TOOTH EXTRACTION N/A 09/02/2022   Procedure: DENTAL RESTORATION/EXTRACTIONS teeth number seven,eight,nine,ten,twenty one,twenty two,twenty three,twenty four,twenty five,twenty six,twenty seven ,twentyeight twentynine thirty,Alveoloplasty;  Surgeon: [name], [name];  Location: [facility] OR;  Service: Oral Surgery;  Laterality: N/A;   TOTAL ABDOMINAL HYSTERECTOMY     TOTAL KNEE ARTHROPLASTY Left 01/17/2023   Procedure: TOTAL KNEE ARTHROPLASTY;  Surgeon: Dannielle Huh, MD;  Location: [facility] ORS;  Service: Orthopedics;  Laterality: Left;   Family History  Problem Relation Age of Onset   Diabetes Father    Hypertension Father    Diabetes Brother        multiple   Hypertension Mother    Hypertension Brother    Breast cancer Maternal Aunt    Heart disease Maternal Grandfather    Heart disease Paternal Grandfather    Colon cancer Cousin    Pancreatic cancer Maternal Grandmother     Social History   Tobacco Use   Smoking status: Never   Smokeless tobacco: Never  Vaping Use   Vaping status: Never Used  Substance Use Topics   Alcohol use:  Yes    Comment: occas glass of wine   Drug use: Never    Procedures   If procedures were preformed on this patient, they are listed below:  Procedures   -------- HPI and MDM generated using voice dictation software and may contain dictation errors. Please contact me for any clarification or with any questions.   Cephus Slater, MD Emergency Medicine PGY-2    Caron Presume, MD 08/04/23 6213    Cathren Laine, MD 08/05/23 [phone]
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
## 2023-08-04 NOTE — Discharge Instructions (Signed)
[name]:  Thank you for allowing [place] to take care of you today.  We hope you begin feeling better soon.  To-Do: Please follow-up with neurology. They will call you to schedule an appointment. Please return to the Emergency Department or call 911 if you experience chest pain, shortness of breath, severe pain, severe fever, altered mental status, or have any reason to think that you need emergency medical care.  Thank you again.  Hope you feel better soon.  Department of Emergency Medicine [facility]

## 2023-08-04 NOTE — ED Notes (Signed)
Veronica Leblanc (Daughter) called for an update  [phone]

## 2023-08-04 NOTE — ED Notes (Signed)
 ED Provider at bedside.

## 2023-08-05 ENCOUNTER — Ambulatory Visit: Payer: 59 | Admitting: Internal Medicine

## 2023-08-18 NOTE — Progress Notes (Signed)
 Viral swab was ordered to evaluate for possible etiology of patient's symptoms.

## 2023-10-18 ENCOUNTER — Ambulatory Visit: Admitting: Internal Medicine

## 2023-10-30 NOTE — Progress Notes (Deleted)
 HPI F never smoker followed for OSA, Insomnia, complicated by CVA, OSA, Endometriosis, Asthma, Allergic Rhinitis, HBP,  NPSG [facility] 05/05/10- AHI 9.6/ hr, RDI 13.2/ hr, desaturation to 77%.  -----------------------------------------------------------------------------------------------------------------------------------------   10/26/21-  61 yoF never smoker followed for OSA, Insomnia, Asthma,  complicated by CVA, OSA, Endometriosis,  Allergic Rhinitis, HBP, DM2,   -Singulair , melatonin, Vistaril , Zyrtec, Symbicort 160, albuterol  hfa, Temazepam , CPAP  Auto 5-15/ Home O2 sleep/ [employer] Download- compliance 67%, AHI 0.4/ hr     Short nights Body weight today-269 lbs Covid vax- 3 Moderna Flu vax- no -----Patients daughter states that she stops breathing more at night when sleeping. Patient states that she wears her CPAP but not as much as she should but she knocks it off while sleeping.  She and her daughter are not clear themselves about whether she is pulling CPAP mask off in her sleep or simply dislodging it.  They do not describe complex parasomnias but she can get restless.  Questions if temazepam  works well enough and I suggested we try Lunesta .  She is interested in trying different CPAP mask so we will arrange mask fitting.  Download reviewed with very good control when used.  11/01/23- 63 yoF never smoker followed for OSA, Insomnia, Asthma,  complicated by CVA, OSA, Endometriosis,  Allergic Rhinitis, HBP, DM2,   -Singulair , melatonin, Vistaril , Zyrtec, Symbicort 160, albuterol  hfa, Temazepam , CPAP  Auto 5-15/ Home O2 sleep/ [employer] Download- compliance  Body weight today-    ROS-see HPI   + = positive Constitutional:    weight loss, night sweats, fevers, chills, +fatigue, lassitude. HEENT:    +headaches, difficulty swallowing, tooth/dental problems, sore throat,       +sneezing, itching, ear ache, +nasal congestion, post nasal drip, snoring CV:    chest pain, orthopnea, PND,  +swelling in lower extremities, anasarca,                                   dizziness, palpitations Resp:   shortness of breath with exertion or at rest.                +productive cough,   +non-productive cough, coughing up of blood.              change in color of mucus.  +wheezing.   Skin:    rash or lesions. GI:  No-   heartburn, indigestion, abdominal pain, nausea, vomiting, diarrhea,                 change in bowel habits, loss of appetite GU: dysuria, change in color of urine, no urgency or frequency.   flank pain. MS:   joint pain, stiffness, decreased range of motion, back pain. Neuro-     nothing unusual Psych:  change in mood or affect.  depression or anxiety.   memory loss.  OBJ- Physical Exam General- Alert, Oriented, Affect-appropriate, Distress- none acute, + obese Skin- rash-none, lesions- none, excoriation- none Lymphadenopathy- none Head- atraumatic            Eyes- Gross vision intact, PERRLA, conjunctivae and secretions clear            Ears- Hearing, canals-normal            Nose- Clear, no-Septal dev, mucus, polyps, erosion, perforation             Throat- Mallampati III , mucosa clear , drainage- none, tonsils- atrophic,  +  many missing teeth Neck- flexible , trachea midline, no stridor , thyroid  nl, carotid no bruit Chest - symmetrical excursion , unlabored           Heart/CV- RRR , no murmur , no gallop  , no rub, nl s1 s2                           - JVD- none , edema- none, stasis changes- none, varices- none           Lung- clear to P&A, wheeze- none, cough- none , dullness-none, rub- none           Chest wall-  Abd-  Br/ Gen/ Rectal- Not done, not indicated Extrem- cyanosis- none, clubbing, none, atrophy- none, strength- nl Neuro- grossly intact to observation

## 2023-11-01 ENCOUNTER — Encounter: Payer: Self-pay | Admitting: Internal Medicine

## 2023-11-01 ENCOUNTER — Ambulatory Visit: Admitting: Internal Medicine

## 2023-11-03 ENCOUNTER — Other Ambulatory Visit: Payer: Self-pay | Admitting: Internal Medicine

## 2023-11-03 NOTE — Telephone Encounter (Signed)
 Temazepam refilled.

## 2023-11-03 NOTE — Telephone Encounter (Signed)
**Note De-identified  [place] Obfuscation** Please advise 

## 2023-11-21 DIAGNOSIS — I351 Nonrheumatic aortic (valve) insufficiency: Secondary | ICD-10-CM | POA: Diagnosis not present

## 2023-12-02 ENCOUNTER — Encounter: Payer: Self-pay | Admitting: Internal Medicine

## 2023-12-15 DIAGNOSIS — I4510 Unspecified right bundle-branch block: Secondary | ICD-10-CM | POA: Diagnosis not present

## 2023-12-16 DIAGNOSIS — I4510 Unspecified right bundle-branch block: Secondary | ICD-10-CM | POA: Diagnosis not present

## 2023-12-17 DIAGNOSIS — I4510 Unspecified right bundle-branch block: Secondary | ICD-10-CM | POA: Diagnosis not present

## 2024-01-08 NOTE — Progress Notes (Deleted)
 HPI F never smoker followed for OSA, Insomnia, complicated by CVA, OSA, Endometriosis, Asthma, Allergic Rhinitis, HBP,  NPSG [facility] 05/05/10- AHI 9.6/ hr, RDI 13.2/ hr, desaturation to 77%.  -----------------------------------------------------------------------------------------------------------------------------------------   04/17/20- 59 yoF never smoker followed for OSA, Insomnia, Asthma, , complicated by CVA, OSA, Endometriosis,  Allergic Rhinitis, HBP, DM2,   Singulair , melatonin, Vistaril , Zyrtec, Symbicort 160, albuterol  hfa CPAP  Auto 5-15/ Home O2 sleep/ [employer] Download- compliance 90%, AHI 0.8/ hr Body weight today- 268 lbs Covid vax- none Flu vax- declines -----pt is here for cpap compliance and temazepam  dosagenot working well Doing well with CPAP and full-face mask is good. Discussed insomnia- can try increasing temazepam  to 30 mg. Discussed oxygen - trying to do without it.  Long discussion of Covid vaccine.   01/10/24- 59 yoF never smoker followed for OSA, Insomnia, Asthma, , complicated by CVA, OSA, Endometriosis,  Allergic Rhinitis, HBP, DM2,   Singulair , melatonin, Vistaril , Zyrtec, Symbicort 160, albuterol  hfa CPAP  Auto 5-15/ Home O2 sleep/ [employer] Download- compliance  Body weight today-    ROS-see HPI   + = positive Constitutional:    weight loss, night sweats, fevers, chills, +fatigue, lassitude. HEENT:    +headaches, difficulty swallowing, tooth/dental problems, sore throat,       +sneezing, itching, ear ache, +nasal congestion, post nasal drip, snoring CV:    chest pain, orthopnea, PND, +swelling in lower extremities, anasarca,                                   dizziness, palpitations Resp:   shortness of breath with exertion or at rest.                +productive cough,   +non-productive cough, coughing up of blood.              change in color of mucus.  +wheezing.   Skin:    rash or lesions. GI:  No-   heartburn, indigestion, abdominal pain,  nausea, vomiting, diarrhea,                 change in bowel habits, loss of appetite GU: dysuria, change in color of urine, no urgency or frequency.   flank pain. MS:   joint pain, stiffness, decreased range of motion, back pain. Neuro-     nothing unusual Psych:  change in mood or affect.  depression or anxiety.   memory loss.  OBJ- Physical Exam General- Alert, Oriented, Affect-appropriate, Distress- none acute, + obese Skin- rash-none, lesions- none, excoriation- none Lymphadenopathy- none Head- atraumatic            Eyes- Gross vision intact, PERRLA, conjunctivae and secretions clear            Ears- Hearing, canals-normal            Nose- Clear, no-Septal dev, mucus, polyps, erosion, perforation             Throat- Mallampati III , mucosa clear , drainage- none, tonsils- atrophic,  + many missing teeth Neck- flexible , trachea midline, no stridor , thyroid  nl, carotid no bruit Chest - symmetrical excursion , unlabored           Heart/CV- RRR , no murmur , no gallop  , no rub, nl s1 s2                           -  JVD- none , edema- none, stasis changes- none, varices- none           Lung- clear to P&A, wheeze- none, cough- none , dullness-none, rub- none           Chest wall-  Abd-  Br/ Gen/ Rectal- Not done, not indicated Extrem- cyanosis- none, clubbing, none, atrophy- none, strength- nl Neuro- grossly intact to observation

## 2024-01-10 ENCOUNTER — Ambulatory Visit: Admitting: Internal Medicine

## 2024-01-10 ENCOUNTER — Encounter: Payer: Self-pay | Admitting: Internal Medicine

## 2024-01-10 DIAGNOSIS — G4734 Idiopathic sleep related nonobstructive alveolar hypoventilation: Secondary | ICD-10-CM

## 2024-01-10 DIAGNOSIS — J4520 Mild intermittent asthma, uncomplicated: Secondary | ICD-10-CM

## 2024-01-10 DIAGNOSIS — G4733 Obstructive sleep apnea (adult) (pediatric): Secondary | ICD-10-CM

## 2024-05-12 ENCOUNTER — Other Ambulatory Visit: Payer: Self-pay | Admitting: Internal Medicine

## 2024-05-14 NOTE — Telephone Encounter (Signed)
 Refill requested for temazepam .  Patients last OV 10/26/2021.  Patient has no-showed and missed several appointments.  Do you want to refuse this refill?   Dr. Neysa, please advise.  Thank you.  Allergies  Allergen Reactions   Lisinopril Anaphylaxis     Tongue swelling  with some SOB     Reglan  [Metoclopramide ] Hives   Stadol [Butorphanol] Hives    Nasal spray    Amitriptyline Hcl Hives and Swelling   Cefazolin Hives and Swelling   Ceftriaxone  Sodium Hives and Swelling   Ciprofloxacin Hives and Swelling   Darvocet [Propoxyphene N-Acetaminophen ] Hives and Swelling   Penicillins Hives and Swelling   Prochlorperazine Edisylate Hives and Swelling   Promethazine Hcl     Jaw locks up   Propoxyphene Hives and Swelling   Sulfamethoxazole Swelling   Sulfonamide Derivatives Hives and Swelling   Sumatriptan Hives and Swelling
